# Patient Record
Sex: Male | Born: 1950 | Race: Black or African American | Hispanic: No | Marital: Married | Smoking: Never smoker
Health system: Southern US, Community
[De-identification: ages and names within clinical notes are randomized; demographics above are authoritative.]

## PROBLEM LIST (undated history)

## (undated) DIAGNOSIS — I10 Essential (primary) hypertension: Secondary | ICD-10-CM

## (undated) DIAGNOSIS — K5790 Diverticulosis of intestine, part unspecified, without perforation or abscess without bleeding: Secondary | ICD-10-CM

## (undated) DIAGNOSIS — K56609 Unspecified intestinal obstruction, unspecified as to partial versus complete obstruction: Secondary | ICD-10-CM

## (undated) DIAGNOSIS — I872 Venous insufficiency (chronic) (peripheral): Secondary | ICD-10-CM

## (undated) DIAGNOSIS — Z87442 Personal history of urinary calculi: Secondary | ICD-10-CM

## (undated) DIAGNOSIS — H409 Unspecified glaucoma: Secondary | ICD-10-CM

## (undated) DIAGNOSIS — C61 Malignant neoplasm of prostate: Secondary | ICD-10-CM

## (undated) DIAGNOSIS — Z8672 Personal history of thrombophlebitis: Secondary | ICD-10-CM

## (undated) DIAGNOSIS — R351 Nocturia: Secondary | ICD-10-CM

## (undated) DIAGNOSIS — R030 Elevated blood-pressure reading, without diagnosis of hypertension: Secondary | ICD-10-CM

## (undated) DIAGNOSIS — K219 Gastro-esophageal reflux disease without esophagitis: Secondary | ICD-10-CM

## (undated) DIAGNOSIS — J309 Allergic rhinitis, unspecified: Secondary | ICD-10-CM

## (undated) HISTORY — PX: OTHER SURGICAL HISTORY: SHX169

## (undated) HISTORY — DX: Elevated blood-pressure reading, without diagnosis of hypertension: R03.0

## (undated) HISTORY — DX: Venous insufficiency (chronic) (peripheral): I87.2

## (undated) HISTORY — DX: Unspecified intestinal obstruction, unspecified as to partial versus complete obstruction: K56.609

## (undated) HISTORY — DX: Gastro-esophageal reflux disease without esophagitis: K21.9

## (undated) HISTORY — DX: Unspecified glaucoma: H40.9

## (undated) HISTORY — DX: Diverticulosis of intestine, part unspecified, without perforation or abscess without bleeding: K57.90

## (undated) HISTORY — PX: COLONOSCOPY: SHX174

## (undated) HISTORY — DX: Personal history of urinary calculi: Z87.442

---

## 1968-10-25 HISTORY — PX: OTHER SURGICAL HISTORY: SHX169

## 1968-10-25 HISTORY — PX: KNEE RECONSTRUCTION: SHX5883

## 2008-02-28 ENCOUNTER — Ambulatory Visit: Payer: Self-pay | Admitting: Family Medicine

## 2008-02-28 DIAGNOSIS — K219 Gastro-esophageal reflux disease without esophagitis: Secondary | ICD-10-CM | POA: Insufficient documentation

## 2008-02-28 DIAGNOSIS — Z87442 Personal history of urinary calculi: Secondary | ICD-10-CM | POA: Insufficient documentation

## 2008-06-04 ENCOUNTER — Ambulatory Visit: Payer: Self-pay | Admitting: Family Medicine

## 2008-06-07 ENCOUNTER — Ambulatory Visit: Payer: Self-pay | Admitting: Family Medicine

## 2008-06-10 ENCOUNTER — Encounter: Payer: Self-pay | Admitting: Family Medicine

## 2008-06-14 LAB — CONVERTED CEMR LAB
Albumin: 3.9 g/dL (ref 3.5–5.2)
BUN: 15 mg/dL (ref 6–23)
Basophils Absolute: 0 10*3/uL (ref 0.0–0.1)
Basophils Relative: 0.3 % (ref 0.0–3.0)
Calcium: 9.2 mg/dL (ref 8.4–10.5)
Chloride: 107 meq/L (ref 96–112)
Creatinine, Ser: 1.2 mg/dL (ref 0.4–1.5)
Direct LDL: 163.7 mg/dL
Eosinophils Absolute: 0.1 10*3/uL (ref 0.0–0.7)
GFR calc Af Amer: 80 mL/min
GFR calc non Af Amer: 66 mL/min
HCT: 43.1 % (ref 39.0–52.0)
HDL: 41.1 mg/dL (ref >39.0)
MCHC: 33.6 g/dL (ref 30.0–36.0)
MCV: 85.4 fL (ref 78.0–100.0)
Monocytes Absolute: 0.5 10*3/uL (ref 0.1–1.0)
Neutro Abs: 2 10*3/uL (ref 1.4–7.7)
Neutrophils Relative %: 51.6 % (ref 43.0–77.0)
PSA: 1.12 ng/mL (ref 0.10–4.00)
RBC: 5.05 M/uL (ref 4.22–5.81)
TSH: 1.35 microintl units/mL (ref 0.35–5.50)
Total Bilirubin: 1 mg/dL (ref 0.3–1.2)
Triglycerides: 42 mg/dL (ref 0–149)

## 2008-10-25 HISTORY — PX: OTHER SURGICAL HISTORY: SHX169

## 2008-11-06 ENCOUNTER — Ambulatory Visit: Payer: Self-pay

## 2008-11-06 ENCOUNTER — Ambulatory Visit: Payer: Self-pay | Admitting: Family Medicine

## 2008-11-06 DIAGNOSIS — I749 Embolism and thrombosis of unspecified artery: Secondary | ICD-10-CM | POA: Insufficient documentation

## 2008-11-08 ENCOUNTER — Ambulatory Visit: Payer: Self-pay | Admitting: Family Medicine

## 2008-11-08 LAB — CONVERTED CEMR LAB
INR: 1.1
Prothrombin Time: 13.2 s

## 2008-11-13 LAB — CONVERTED CEMR LAB
Albumin: 3.8 g/dL (ref 3.5–5.2)
Alkaline Phosphatase: 47 units/L (ref 39–117)
BUN: 18 mg/dL (ref 6–23)
Bilirubin, Direct: 0.1 mg/dL (ref 0.0–0.3)
Calcium: 9.4 mg/dL (ref 8.4–10.5)
Eosinophils Absolute: 0.1 10*3/uL (ref 0.0–0.7)
GFR calc Af Amer: 88 mL/min
GFR calc non Af Amer: 73 mL/min
Glucose, Bld: 87 mg/dL (ref 70–99)
HCT: 44.5 % (ref 39.0–52.0)
Hemoglobin: 15.3 g/dL (ref 13.0–17.0)
MCV: 83.5 fL (ref 78.0–100.0)
Monocytes Absolute: 0.5 10*3/uL (ref 0.1–1.0)
Neutro Abs: 2.7 10*3/uL (ref 1.4–7.7)
Platelets: 227 10*3/uL (ref 150–400)
Potassium: 4.1 meq/L (ref 3.5–5.1)
RDW: 13.4 % (ref 11.5–14.6)
Sodium: 141 meq/L (ref 135–145)
TSH: 1.8 microintl units/mL (ref 0.35–5.50)
Total Protein: 7.2 g/dL (ref 6.0–8.3)

## 2008-11-15 ENCOUNTER — Ambulatory Visit: Payer: Self-pay | Admitting: Family Medicine

## 2008-11-15 DIAGNOSIS — I82409 Acute embolism and thrombosis of unspecified deep veins of unspecified lower extremity: Secondary | ICD-10-CM | POA: Insufficient documentation

## 2008-11-15 LAB — CONVERTED CEMR LAB
INR: 1.1
Prothrombin Time: 12.8 s

## 2008-11-25 ENCOUNTER — Telehealth: Payer: Self-pay | Admitting: Family Medicine

## 2008-11-29 ENCOUNTER — Ambulatory Visit: Payer: Self-pay | Admitting: Family Medicine

## 2008-11-29 DIAGNOSIS — R030 Elevated blood-pressure reading, without diagnosis of hypertension: Secondary | ICD-10-CM | POA: Insufficient documentation

## 2008-11-29 LAB — CONVERTED CEMR LAB: INR: 1.3

## 2008-12-13 ENCOUNTER — Ambulatory Visit: Payer: Self-pay | Admitting: Family Medicine

## 2009-01-10 ENCOUNTER — Ambulatory Visit: Payer: Self-pay | Admitting: Family Medicine

## 2009-02-10 ENCOUNTER — Ambulatory Visit: Payer: Self-pay | Admitting: Family Medicine

## 2009-02-10 LAB — CONVERTED CEMR LAB
INR: 1.5
Prothrombin Time: 12.8 s

## 2009-02-14 ENCOUNTER — Ambulatory Visit: Payer: Self-pay | Admitting: Family Medicine

## 2009-02-14 DIAGNOSIS — J309 Allergic rhinitis, unspecified: Secondary | ICD-10-CM | POA: Insufficient documentation

## 2009-03-07 ENCOUNTER — Ambulatory Visit: Payer: Self-pay | Admitting: Family Medicine

## 2009-03-07 LAB — CONVERTED CEMR LAB: INR: 2.7

## 2009-04-04 ENCOUNTER — Ambulatory Visit: Payer: Self-pay | Admitting: Family Medicine

## 2009-04-04 LAB — CONVERTED CEMR LAB
INR: 3
Prothrombin Time: 20.9 s

## 2009-04-25 ENCOUNTER — Ambulatory Visit: Payer: Self-pay | Admitting: Family Medicine

## 2009-05-02 ENCOUNTER — Encounter: Payer: Self-pay | Admitting: Family Medicine

## 2009-05-02 ENCOUNTER — Ambulatory Visit: Payer: Self-pay

## 2009-05-12 ENCOUNTER — Encounter: Payer: Self-pay | Admitting: Family Medicine

## 2009-09-03 ENCOUNTER — Encounter (INDEPENDENT_AMBULATORY_CARE_PROVIDER_SITE_OTHER): Payer: Self-pay | Admitting: *Deleted

## 2009-09-23 ENCOUNTER — Telehealth: Payer: Self-pay | Admitting: Family Medicine

## 2009-09-23 ENCOUNTER — Ambulatory Visit: Payer: Self-pay

## 2009-09-23 ENCOUNTER — Ambulatory Visit: Payer: Self-pay | Admitting: Family Medicine

## 2010-01-05 ENCOUNTER — Ambulatory Visit: Payer: Self-pay | Admitting: Vascular Surgery

## 2010-01-05 ENCOUNTER — Encounter: Payer: Self-pay | Admitting: Family Medicine

## 2010-01-13 ENCOUNTER — Ambulatory Visit: Payer: Self-pay | Admitting: Family Medicine

## 2010-01-13 DIAGNOSIS — J069 Acute upper respiratory infection, unspecified: Secondary | ICD-10-CM | POA: Insufficient documentation

## 2010-01-13 DIAGNOSIS — N390 Urinary tract infection, site not specified: Secondary | ICD-10-CM | POA: Insufficient documentation

## 2010-01-13 DIAGNOSIS — I10 Essential (primary) hypertension: Secondary | ICD-10-CM | POA: Insufficient documentation

## 2010-01-14 ENCOUNTER — Encounter: Payer: Self-pay | Admitting: Family Medicine

## 2010-03-17 ENCOUNTER — Telehealth: Payer: Self-pay | Admitting: Family Medicine

## 2010-03-20 ENCOUNTER — Ambulatory Visit: Payer: Self-pay | Admitting: Family Medicine

## 2010-03-20 DIAGNOSIS — J209 Acute bronchitis, unspecified: Secondary | ICD-10-CM | POA: Insufficient documentation

## 2010-03-31 ENCOUNTER — Ambulatory Visit: Payer: Self-pay | Admitting: Vascular Surgery

## 2010-05-02 ENCOUNTER — Ambulatory Visit: Payer: Self-pay | Admitting: Family Medicine

## 2010-05-02 DIAGNOSIS — M25569 Pain in unspecified knee: Secondary | ICD-10-CM | POA: Insufficient documentation

## 2010-05-12 ENCOUNTER — Ambulatory Visit: Payer: Self-pay | Admitting: Family Medicine

## 2010-05-25 ENCOUNTER — Ambulatory Visit: Payer: Self-pay | Admitting: Vascular Surgery

## 2010-06-02 ENCOUNTER — Ambulatory Visit: Payer: Self-pay | Admitting: Vascular Surgery

## 2010-06-04 ENCOUNTER — Ambulatory Visit: Payer: Self-pay | Admitting: Family Medicine

## 2010-08-10 ENCOUNTER — Ambulatory Visit: Payer: Self-pay | Admitting: Vascular Surgery

## 2010-09-11 ENCOUNTER — Ambulatory Visit: Payer: Self-pay | Admitting: Family Medicine

## 2010-09-11 DIAGNOSIS — S61209A Unspecified open wound of unspecified finger without damage to nail, initial encounter: Secondary | ICD-10-CM | POA: Insufficient documentation

## 2010-09-11 DIAGNOSIS — L02519 Cutaneous abscess of unspecified hand: Secondary | ICD-10-CM | POA: Insufficient documentation

## 2010-09-11 DIAGNOSIS — L03019 Cellulitis of unspecified finger: Secondary | ICD-10-CM

## 2010-11-24 NOTE — Assessment & Plan Note (Signed)
Summary: consult re: knee pain/cjr   Vital Signs:  Patient profile:   60 year old male Weight:      218 pounds BP sitting:   136 / 84  (left arm) Cuff size:   large  Vitals Entered By: Raechel Ache, RN (May 12, 2010 10:47 AM) CC: C/o L knee pain.   History of Present Illness: Here to discuss pain in the left knee which started about one month ago after he was working on a house where he had to crawl in and out of a window several times. Pain is centered just under the kneecap. The knee swelled at first, but this has improved a lot. using ice packs and Motrin. The pain has slowly improved ever since. Now he has only slight discomfort when he goes up or down stairs. he is scheduled for a laser ablation of the left saphenous vein on 05-25-10, and he wants to make sure this knee issue will not affect the procedure.   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 Josephina Gip  Past Surgical History: Reviewed history from 02/28/2008 and no changes required. Rt knee reconstruction,  injured playing football  Review of Systems  The patient denies anorexia, fever, weight loss, weight gain, vision loss, decreased hearing, hoarseness, chest pain, syncope, dyspnea on exertion, peripheral edema, prolonged cough, headaches, hemoptysis, abdominal pain, melena, hematochezia, severe indigestion/heartburn, hematuria, incontinence, genital sores, muscle weakness, suspicious skin lesions, transient blindness, difficulty walking, depression, unusual weight change, abnormal bleeding, enlarged lymph nodes, angioedema, breast masses, and testicular masses.    Physical Exam  General:  Well-developed,well-nourished,in no acute distress; alert,appropriate and cooperative throughout examination Extremities:   the left knee is normal, no swelling or tenderness. Full ROM   Impression & Recommendations:  Problem # 1:  KNEE PAIN, LEFT, ACUTE (ICD-719.46)  His updated medication list for this problem includes:    Aspirin 81 Mg Tbec (Aspirin) ..... One by mouth every day    Mobic 15 Mg Tabs (Meloxicam) .Marland Kitchen... 1/2 -1 by mouth once daily as needed  Complete Medication List: 1)  Multivitamins Tabs (Multiple vitamin) .Marland Kitchen.. 1 by mouth once daily 2)  Aspirin 81 Mg Tbec (Aspirin) .... One by mouth every day 3)  Mobic 15 Mg Tabs (Meloxicam) .... 1/2 -1 by mouth once daily as needed  Patient Instructions: 1)  This was probably an episode of prepatellar bursitis, but in any event it seems to be almost gone at this point. I reassured him that this would not impact his upcoming procedure at all.  2)  Please schedule a follow-up appointment as needed .

## 2010-11-24 NOTE — Assessment & Plan Note (Signed)
Summary: INFECTED FINGER? // RS   Vital Signs:  Patient profile:   60 year old male O2 Sat:      94 % Temp:     98.6 degrees F Pulse rate:   82 / minute BP sitting:   140 / 90  (left arm) Cuff size:   large  Vitals Entered By: Pura Spice, RN (September 11, 2010 1:15 PM) CC: rt index finger cut at work and thinks infected  occurred 2 wks ago states had TD at his work 2010    History of Present Illness: Here for a laceration to the right index finger which he sustained at work 2 weeks ago. He caught the finger on a sharp piece of steel. He has been cleaning it several times a day with hydrogen peroxide and then dressing it with Neosporin and a Bandaid. It is no longer painful, but it doesn't seem to be healing.   Allergies: No Known Drug Allergies  Past History:  Past Medical History: Reviewed history from 05/12/2010 and no changes required. 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 Josephina Gip  Past Surgical History: Reviewed history from 06/04/2010 and no changes required. Rt knee reconstruction,  injured playing football endovascular laser ablation of veins in left leg per Dr. Hart Rochester [phone]  Review of Systems  The patient denies anorexia, fever, weight loss, weight gain, vision loss, decreased hearing, hoarseness, chest pain, syncope, dyspnea on exertion, peripheral edema, prolonged cough, headaches, hemoptysis, abdominal pain, melena, hematochezia, severe indigestion/heartburn, hematuria, incontinence, genital sores, muscle weakness, suspicious skin lesions, transient blindness, difficulty walking, depression, unusual weight change, abnormal bleeding, enlarged lymph nodes, angioedema, breast masses, and testicular masses.    Physical Exam  General:  Well-developed,well-nourished,in no acute distress; alert,appropriate and cooperative throughout  examination Extremities:  there is a partially healed open wound on the dorsal right index finger which has dry callused edges and a large lump of granulation tissue growing off one side. Ful ROM, not tender, no drainage   Impression & Recommendations:  Problem # 1:  LACERATION OF FINGER (ICD-883.0)  Orders: I&D Abscess, Simple / Single (10060)  Complete Medication List: 1)  Multivitamins Tabs (Multiple vitamin) .Marland Kitchen.. 1 by mouth once daily 2)  Aspirin 81 Mg Tbec (Aspirin) .... One by mouth every day 3)  Mobic 15 Mg Tabs (Meloxicam) .... 1/2 -1 by mouth once daily as needed 4)  Doxycycline Hyclate 100 Mg Caps (Doxycycline hyclate) .... Two times a day  Patient Instructions: 1)  The wound was cleansed with Betadine, then LA was achieved using 1% Xylocaine. The granulation tissue was debrided with a scalpel. The bleeding was controlled with a hyfrecator. The wound was dressed with gauze. he will clean the wound daily with soap and water, and dress it with Neosporin and a Bandaid. I advised him to not use hydrogen peroxide any more. Cover with Doxycycline and follow up as needed . Prescriptions: DOXYCYCLINE HYCLATE 100 MG CAPS (DOXYCYCLINE HYCLATE) two times a day  #20 x 0   Entered and Authorized by:   Nelwyn Salisbury MD   Signed by:   Nelwyn Salisbury MD on 09/11/2010   Method used:   Electronically to        [facility] (retail)       [address] [facility]  [place], [place]  [address]       Ph: [phone]       Fax: [phone]   RxID:   [id number]    Orders Added: 1)  I&D Abscess, Simple / Single [10060]   Immunization History:  Tetanus/Td Immunization History:    Tetanus/Td:  historical (10/25/2008)   Immunization History:  Tetanus/Td Immunization History:    Tetanus/Td:  Historical (10/25/2008)

## 2010-11-24 NOTE — Assessment & Plan Note (Signed)
Summary: blood pressure check/cjr   Vital Signs:  Patient profile:   60 year old male Weight:      222 pounds BMI:     34.90 Temp:     98.2 degrees F oral BP sitting:   140 / 102  (left arm) Cuff size:   regular  Vitals Entered By: Raechel Ache, RN (January 13, 2010 8:42 AM) CC: BP elevated- recent cold and took OTC meds.   History of Present Illness: Here to check his BP and his urine. He had been feeling fine with normal BPs until about 2 weeks ago. In fact his BP when donating blood 2 weeks ago was 120/82. Then he developed URI symptoms with chest congestion and a dry cough. No fever. He took a lot of OTC cold remedies like Alka Seltzer Plus for 2 weeks. Now he feels better and the cough has stopped, but his BP has been high at home. Also during this time he has felt some burning on urinations.   Allergies (verified): No Known Drug Allergies  Past History:  Past Medical History: Reviewed history from 09/23/2009 and no changes required. 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    Physical Exam  General:  Well-developed,well-nourished,in no acute distress; alert,appropriate and cooperative throughout examination Head:  Normocephalic and atraumatic without obvious abnormalities. No apparent alopecia or balding. Eyes:  No corneal or conjunctival inflammation noted. EOMI. Perrla. Funduscopic exam benign,  without hemorrhages, exudates or papilledema. Vision grossly normal. Ears:  External ear exam shows no significant lesions or deformities.  Otoscopic examination reveals clear canals, tympanic membranes are intact bilaterally without bulging, retraction, inflammation or discharge. Hearing is grossly normal bilaterally. Nose:  External nasal examination shows no deformity or inflammation. Nasal mucosa are pink and moist without lesions or exudates. Mouth:  Oral mucosa and oropharynx without lesions or exudates.  Teeth in good repair. Neck:  No deformities, masses, or tenderness noted. Lungs:  Normal respiratory effort, chest expands symmetrically. Lungs are clear to auscultation, no crackles or wheezes. Heart:  Normal rate and regular rhythm. S1 and S2 normal without gallop, murmur, click, rub or other extra sounds.   Impression & Recommendations:  Problem # 1:  UTI (ICD-599.0)  His updated medication list for this problem includes:    Cipro 500 Mg Tabs (Ciprofloxacin hcl) .Marland Kitchen..Marland Kitchen Two times a day  Orders: UA Dipstick w/o Micro (manual) (87564) T-Culture, Urine (33295-18841)  Problem # 2:  VIRAL URI (ICD-465.9)  His updated medication list for this problem includes:    Aspirin 81 Mg Tbec (Aspirin) ..... One by mouth every day  Problem # 3:  ESSENTIAL HYPERTENSION (ICD-401.9)  Complete Medication List: 1)  Multivitamins Tabs (Multiple vitamin) .Marland Kitchen.. 1 by mouth once daily 2)  Aspirin 81 Mg Tbec (Aspirin) .... One by mouth every day 3)  Cipro 500 Mg Tabs (Ciprofloxacin hcl) .... Two times a day  Patient Instructions: 1)  I think his BP has been up from the recent URI and UTI, as well as the OTC meds he hasbeen taking. Treat with Cipro, and cutlture the urine.  Drink more water. Stop all OTC cold meds.  Prescriptions: CIPRO 500 MG TABS (CIPROFLOXACIN HCL) two times a day  #14 x 0   Entered and Authorized by:   Nelwyn Salisbury MD   Signed by:   Nelwyn Salisbury MD on 01/13/2010   Method used:    Electronically to        [facility] (retail)       [address]       [place], [place]  [address]       Ph: [phone]       Fax: [phone]   RxID:   [id number]   Appended Document: blood pressure check/cjr    Clinical Lists Changes  Observations: Added new observation of PH URINE: 5.0  (01/13/2010 9:58) Added new observation of SPEC GR URIN: 1.015  (01/13/2010 9:58) Added new observation of APPEARANCE U: Clear  (01/13/2010 9:58) Added new observation of UA COLOR: yellow  (01/13/2010 9:58) Added new observation of WBC DIPSTK U: negative  (01/13/2010 9:58) Added new observation of NITRITE URN: negative  (01/13/2010 9:58) Added new observation of UROBILINOGEN: 0.2  (01/13/2010 9:58) Added new observation of PROTEIN, URN: trace  (01/13/2010 9:58) Added new observation of BLOOD UR DIP: trace-lysed  (01/13/2010 9:58) Added new observation of KETONES URN: negative  (01/13/2010 9:58) Added new observation of BILIRUBIN UR: negative  (01/13/2010 9:58) Added new observation of GLUCOSE, URN: negative  (01/13/2010 9:58)      Laboratory Results   Urine Tests    Routine Urinalysis   Color: yellow Appearance: Clear Glucose: negative   (Normal Range: Negative) Bilirubin: negative   (Normal Range: Negative) Ketone: negative   (Normal Range: Negative) Spec. Gravity: 1.015   (Normal Range: 1.003-1.035) Blood: trace-lysed   (Normal Range: Negative) pH: 5.0   (Normal Range: 5.0-8.0) Protein: trace   (Normal Range: Negative) Urobilinogen: 0.2   (Normal Range: 0-1) Nitrite: negative   (Normal Range: Negative) Leukocyte Esterace: negative   (Normal Range: Negative)

## 2010-11-24 NOTE — Letter (Signed)
Summary: [facility]  [facility]   Imported By: Maryln Gottron 01/26/2010 11:16:25  _____________________________________________________________________  External Attachment:    Type:   Image     Comment:   External Document

## 2010-11-24 NOTE — Assessment & Plan Note (Signed)
Summary: LEFT LEG SORENESS...AS.   Vital Signs:  Patient profile:   60 year old male Height:      67 inches Weight:      219 pounds BMI:     34.42 O2 Sat:      96 % on Room air Pulse rate:   78 / minute BP sitting:   126 / 82  (left arm) Cuff size:   large  Vitals Entered By: Payton Spark CMA (May 02, 2010 11:01 AM)  O2 Flow:  Room air CC: L leg achy x 2 weeks. Has a blood clot near sore area and is scheduled for surgery in Aug.   History of Present Illness: Pt c/o L knee pain x 1 week.  pt thinks he may have bumped it and he notices everytime he changes the type of shoes he wears it acts up.   Pt having surgery on veins in August.  Pt was on coumadin for 6 months for Superficial thrombophlebitis a year ago..  This does feel different.  No calf pain  Current Medications (verified): 1)  Multivitamins   Tabs (Multiple Vitamin) .Marland Kitchen.. 1 By Mouth Once Daily 2)  Aspirin 81 Mg  Tbec (Aspirin) .... One By Mouth Every Day 3)  Hydromet 5-1.5 Mg/73ml Syrp (Hydrocodone-Homatropine) .Marland Kitchen.. 1 Tsp Q 4 Hours As Needed Cough 4)  Mobic 15 Mg Tabs (Meloxicam) .... 1/2 -1 By Mouth Once Daily As Needed  Allergies (verified): No Known Drug Allergies  Past History:  Past medical, surgical, family and social histories (including risk factors) reviewed for relevance to current acute and chronic problems.  Past Medical History: Reviewed history from 09/23/2009 and no changes required. 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 Rt knee reconstruction,  injured playing football  Family History: Reviewed history from 02/28/2008 and no changes required. Family History Breast cancer 1st degree relative <50 Family History Diabetes 1st degree relative Family History Hypertension Family History of Prostate CA 1st degree relative <50  Social History: Reviewed  history from 02/28/2008 and no changes required. Married Never Smoked Alcohol use-yes Drug use-no Regular exercise-yes  Review of Systems      See HPI  Physical Exam  General:  Well-developed,well-nourished,in no acute distress; alert,appropriate and cooperative throughout examination Msk:  L knee pain-- no pain with palpation  no swelling  NO Calf pain + varicose veins---no pain Skin:  color normal, no rashes, no ulcerations, and no edema.   Psych:  Oriented X3 and normally interactive.     Impression & Recommendations:  Problem # 1:  KNEE PAIN, LEFT, ACUTE (ICD-719.46) wear knee sleeve His updated medication list for this problem includes:    Aspirin 81 Mg Tbec (Aspirin) ..... One by mouth every day    Mobic 15 Mg Tabs (Meloxicam) .Marland Kitchen... 1/2 -1 by mouth once daily as needed  Discussed strengthening exercises, use of ice or heat, and medications.   Complete Medication List: 1)  Multivitamins Tabs (Multiple vitamin) .Marland Kitchen.. 1 by mouth once daily 2)  Aspirin 81 Mg Tbec (Aspirin) .... One by mouth every day 3)  Hydromet 5-1.5 Mg/26ml Syrp (Hydrocodone-homatropine) .Marland Kitchen.. 1 tsp q 4 hours as needed cough 4)  Mobic 15 Mg Tabs (Meloxicam) .... 1/2 -1 by mouth once daily as needed  Patient Instructions: 1)  If pain in calf develops or swelling --go to eR 2)  f/u pcp next week if  no improvement Prescriptions: MOBIC 15 MG TABS (MELOXICAM) 1/2 -1 by mouth once daily as needed  #30 x 2   Entered and Authorized by:   Loreen Freud DO   Signed by:   Loreen Freud DO on 05/02/2010   Method used:   Electronically to        [facility] (retail)       [address]       [place], [place]  [address]       Ph: [phone]       Fax: [phone]   RxID:   [id number]

## 2010-11-24 NOTE — Assessment & Plan Note (Signed)
Summary: cough/njr   Vital Signs:  Patient profile:   60 year old male Weight:      212 pounds BMI:     33.32 Temp:     98.4 degrees F oral BP sitting:   130 / 98  (left arm) Cuff size:   regular  Vitals Entered By: Raechel Ache, RN (Mar 20, 2010 10:14 AM) CC: C/o cough and congestion.   History of Present Illness: Here for 2 weeks of chest congestion and coughing up green sputum. No fever.   Allergies: No Known Drug Allergies  Past History:  Past Medical History: Reviewed history from 09/23/2009 and no changes required. 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    Physical Exam  General:  Well-developed,well-nourished,in no acute distress; alert,appropriate and cooperative throughout examination Head:  Normocephalic and atraumatic without obvious abnormalities. No apparent alopecia or balding. Eyes:  No corneal or conjunctival inflammation noted. EOMI. Perrla. Funduscopic exam benign, without hemorrhages, exudates or papilledema. Vision grossly normal. Ears:  External ear exam shows no significant lesions or deformities.  Otoscopic examination reveals clear canals, tympanic membranes are intact bilaterally without bulging, retraction, inflammation or discharge. Hearing is grossly normal bilaterally. Nose:  External nasal examination shows no deformity or inflammation. Nasal mucosa are pink and moist  without lesions or exudates. Mouth:  Oral mucosa and oropharynx without lesions or exudates.  Teeth in good repair. Neck:  No deformities, masses, or tenderness noted. Lungs:  Normal respiratory effort, chest expands symmetrically. Lungs are clear to auscultation, no crackles or wheezes.   Impression & Recommendations:  Problem # 1:  ACUTE BRONCHITIS (ICD-466.0)  His updated medication list for this problem includes:    Zithromax Z-pak 250 Mg Tabs (Azithromycin) .Marland Kitchen... As directed    Hydromet 5-1.5 Mg/50ml Syrp (Hydrocodone-homatropine) .Marland Kitchen... 1 tsp q 4 hours as needed cough  Complete Medication List: 1)  Multivitamins Tabs (Multiple vitamin) .Marland Kitchen.. 1 by mouth once daily 2)  Aspirin 81 Mg Tbec (Aspirin) .... One by mouth every day 3)  Zithromax Z-pak 250 Mg Tabs (Azithromycin) .... As directed 4)  Hydromet 5-1.5 Mg/2ml Syrp (Hydrocodone-homatropine) .Marland Kitchen.. 1 tsp q 4 hours as needed cough  Patient Instructions: 1)  Please schedule a follow-up appointment as needed .  Prescriptions: HYDROMET 5-1.5 MG/5ML SYRP (HYDROCODONE-HOMATROPINE) 1 tsp q 4 hours as needed cough  #240 x 0   Entered and Authorized by:   Nelwyn Salisbury MD   Signed by:   Nelwyn Salisbury MD on 03/20/2010   Method used:   Print then Give to Patient   RxID:   [id number] ZITHROMAX Z-PAK 250 MG TABS (AZITHROMYCIN) as directed  #1 x 0   Entered and Authorized by:   Nelwyn Salisbury MD   Signed by:   Nelwyn Salisbury MD on 03/20/2010   Method used:   Print then Give to Patient   RxID:   [id number]

## 2010-11-24 NOTE — Progress Notes (Signed)
Summary: wants cough relief  Phone Note Call from Patient Call back at Home Phone [phone] Call back at [phone]   Caller: Patient---live call Summary of Call: Complains of productive coughing. no other sxs. Would like something called to [facility] in [place].  Initial call taken by: Warnell Forester,  Mar 17, 2010 12:51 PM  Follow-up for Phone Call        needs an OV Follow-up by: Nelwyn Salisbury MD,  Mar 17, 2010 3:11 PM  Additional Follow-up for Phone Call Additional follow up Details #1::        lmoam-cell Additional Follow-up by: Warnell Forester,  Mar 17, 2010 3:34 PM    Additional Follow-up for Phone Call Additional follow up Details #2::    lmoam again on home #. Follow-up by: Warnell Forester,  Mar 18, 2010 1:35 PM  Additional Follow-up for Phone Call Additional follow up Details #3:: Details for Additional Follow-up Action Taken: Pt has an appt on 03-20-2010. Additional Follow-up by: Warnell Forester,  Mar 19, 2010 4:56 PM

## 2010-11-24 NOTE — Assessment & Plan Note (Signed)
Summary: concerned about BP elevation/dm   Vital Signs:  Patient profile:   60 year old male Weight:      219 pounds BP sitting:   130 / 82  (left arm) Cuff size:   regular  Vitals Entered By: Raechel Ache, RN (June 04, 2010 11:13 AM) CC: Concerned about elevated BP- taking high doses Ibuprofen after surgery.   History of Present Illness: Here to discuss elevations in his BP. He checks this frequently at home, and it usually runs 120s or 130s over 80s. He has had a few elevations to 150/90, and he thinks these were all due to consuming a meal with a lot of sodium the night before. Then yesterday after another endovascular laser treatment to his left leg by Dr. Hart Rochester, his BP went up to 170/100. He had no symptoms with this. He thinks this was from taking high amounts of Ibuprofen after the procedure. Then he took no Ibuprofen last or this am, and his BP is normal again.   Allergies: No Known Drug Allergies  Past History:  Past Medical History: Reviewed history from 05/12/2010 and no changes required. 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 Josephina Gip  Past Surgical History: Rt knee reconstruction,  injured playing football endovascular laser ablation of veins in left leg per Dr. Hart Rochester [phone]  Review of Systems  The patient denies anorexia, fever, weight loss, weight gain, vision loss, decreased hearing, hoarseness, chest pain, syncope, dyspnea on exertion, peripheral edema, prolonged cough, headaches, hemoptysis, abdominal pain, melena, hematochezia, severe indigestion/heartburn, hematuria, incontinence, genital sores, muscle weakness, suspicious skin lesions, transient blindness, difficulty walking, depression, unusual weight change, abnormal bleeding, enlarged lymph nodes, angioedema, breast masses, and testicular masses.    Physical  Exam  General:  Well-developed,well-nourished,in no acute distress; alert,appropriate and cooperative throughout examination Neck:  No deformities, masses, or tenderness noted. Lungs:  Normal respiratory effort, chest expands symmetrically. Lungs are clear to auscultation, no crackles or wheezes. Heart:  Normal rate and regular rhythm. S1 and S2 normal without gallop, murmur, click, rub or other extra sounds. Extremities:  healing incisions on left lower leg   Impression & Recommendations:  Problem # 1:  ELEVATED BLOOD PRESSURE (ICD-796.2)  Complete Medication List: 1)  Multivitamins Tabs (Multiple vitamin) .Marland Kitchen.. 1 by mouth once daily 2)  Aspirin 81 Mg Tbec (Aspirin) .... One by mouth every day 3)  Mobic 15 Mg Tabs (Meloxicam) .... 1/2 -1 by mouth once daily as needed  Patient Instructions: 1)  I agree that these elevations could come from high doses of Ibuprofen or from excessive sodium intake. He will avoid both of these and will follow his BP closely.  2)  Please schedule a follow-up appointment in 3 months .

## 2011-03-09 NOTE — Procedures (Signed)
DUPLEX DEEP VENOUS EXAM - LOWER EXTREMITY   INDICATION:  Postop 1 week ELAS of left greater saphenous vein.   HISTORY:  Edema:  No.  Trauma/Surgery:  05/25/10, ELAS of left greater saphenous vein and  phlebectomies.  Pain:  Left leg is tender.  PE:  No.  Previous DVT:  No.  Anticoagulants:  No.  Other:   DUPLEX EXAM:                CFV   SFV   PopV  PTV    GSV                R  L  R  L  R  L  R   L  R  L  Thrombosis    o  o     o     o      o     +  Spontaneous   +  +     +     +      +     o  Phasic        +  +     +     +      +     o  Augmentation  +  +     +     +      +     o  Compressible  +  +     +     +      +     o  Competent     +  +     +     +      +     o   Legend:  + - yes  o - no  p - partial  D - decreased   IMPRESSION:  Left lower extremity appears to be negative for deep venous  thrombosis.  The left greater saphenous vein was lasered closed and  appears to be thrombosed.    _____________________________  Quita Skye Hart [place], M.D.   NT/MEDQ  D:  06/02/2010  T:  06/03/2010  Job:  [id number]

## 2011-03-09 NOTE — Assessment & Plan Note (Signed)
OFFICE VISIT   HART, HAAS  DOB:  19-Oct-1951                                       05/25/2010  ZOXWR#:[id number]   Patient had laser ablation of his left great saphenous vein from the  knee to the saphenofemoral junction with 10-20 stab phlebectomies for  painful varicosities.   He tolerated the procedure well and will return in 1 week for venous  duplex exam to confirm closure.     Quita Skye Hart [place], M.D.  Electronically Signed   JDL/MEDQ  D:  05/25/2010  T:  05/26/2010  Job:  [id number]

## 2011-03-09 NOTE — Procedures (Signed)
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   EXAM:  Using color-flow imaging and pulse Doppler spectral analysis, the  left common femoral, superficial femoral, popliteal, posterior tibial,  greater and lesser saphenous veins were evaluated.  There is evidence  suggesting deep venous insufficiency in the left lower extremity.   The left saphenofemoral junction is not competent with reflux of  >559milliseconds.  The left GSV is not competent with reflux of  >500  milliseconds with the caliber as described below.   The left proximal short saphenous vein demonstrates competency, however  evidence of chronic thrombus.   GSV Diameter (used if found to be incompetent only)                                            Right    Left  Proximal Greater Saphenous Vein           cm       1.11 cm  Proximal-to-mid-thigh                     cm       cm  Mid thigh                                 cm       0.83 cm  Mid-distal thigh                          cm       cm  Distal thigh                              cm       1.27 cm  Knee                                      cm       0.68 cm   IMPRESSION:  1. Left greater saphenous vein reflux with >564milliseconds is      identified with the caliber ranging from 0.68 cm to 1.11 cm knee to      groin.  2. The left greater saphenous vein is not aneurysmal.  3. The left greater saphenous vein is not tortuous.  4. The deep venous system is not competent with reflux of >500      milliseconds.  5. The left lesser saphenous vein is competent; however, evidence of      partially-occluding chronic thrombus was noted.  6. 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   ___________________________________________  Quita Skye Hart [place], M.D.   AS/MEDQ  D:  01/05/2010  T:  01/05/2010  Job:  [id number]

## 2011-03-09 NOTE — Consult Note (Signed)
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  He has had some occasional stinging and  throbbing discomfort associated with these over the years, and then 1  year ago he developed superficial thrombophlebitis with no evidence of  deep venous thrombosis.  This required anticoagulation with Coumadin for  6 months; hence, his symptoms gradually improved.  Since that time he  has had persistent swelling in the left calf with some thickening of the  skin in the left ankle area.  He has no history of bleeding or stasis  ulceration.  He does not wear elastic compression stockings nor take  pain medicine and occasionally will elevate the left leg.  He has no  symptoms in the contralateral right leg.  He has noticed the progressive  edema of the left calf over the last few years.   CHRONIC MEDICAL PROBLEMS:  1. History of right knee surgery for traumatic injury from football.  2. History of kidney stone.  3. Negative for diabetes, hypertension, coronary artery disease, COPD      or stroke.   FAMILY HISTORY:  Positive for diabetes in a sister, coronary artery  disease in his father at very elderly age of 65.   SOCIAL HISTORY:  He is married, has 5 children.  Does not use tobacco or  alcohol.   REVIEW OF SYSTEMS:  The patient denies any weight loss, anorexia, chest  pain, dyspnea on exertion, PND, orthopnea.  No GI or GU symptoms.  No  claudication.  Does occasionally have cramping in the left leg, as noted  above.  No dizziness, blackouts.  Denies any musculoskeletal symptoms.  All other systems in the review of systems are negative.   PHYSICAL EXAMINATION:  Blood pressure 151/102, heart rate 64,  respirations 18.  General:  He is a healthy, well-developed, well-  nourished male  who is no apparent distress.  HEENT:  EOMs intact.  Conjunctivae normal.  Neck:  Supple, 3+ carotid pulses.  No bruits are  audible.  No adenopathy.  Lungs:  Clear to auscultation.  No wheezing.  Cardiovascular:  Regular rhythm.  No murmurs.  Abdomen:  Soft, nontender  with no masses. Musculoskeletal:  No major deformities.  Well-healed  incision over the right knee.  Neurologic:  Normal.  Skin:  Free of  rashes.  There is some thickening of the skin in the lower third of the  left leg.  He has 3+ femoral, popliteal and dorsalis pedis pulses  bilaterally.  He has large bulging varicosities beginning at the knee  level over the left great saphenous system extending down to the mid  calf.  Circumference of the left calf is 44.5, right calf is 41.  No  stasis ulcers are noted.   Today I ordered a venous duplex exam which I reviewed and interpreted.  He has gross reflux throughout the left great saphenous system from the  saphenofemoral junction to the ankle, with some areas of chronic  thrombus and varicose veins off the great saphenous vein just below the  knee.  There is mild reflux in the left deep system with no DVT.  This patient has severe left great saphenous reflux with valvular  incompetence which has been complicated by superficial thrombophlebitis  and has chronic edema in the left calf, putting him at increased risk  for stasis ulceration as well as pain and/or bleeding from varicosities.  We will treat him initially conservatively with long-leg elastic  compression stockings (20 mm-30 mm gradient) as well as ibuprofen for  pain and elevation of the lower extremity on a regular basis.  He will  return in 3 months, and at that time we should consider laser ablation  of the left great saphenous vein with multiple stab phlebectomies to be  done as a single procedure.     Quita Skye Hart [place], M.D.  Electronically Signed   JDL/MEDQ  D:  01/05/2010  T:  01/06/2010  Job:  [id number]    cc:   Jeannett Senior A. Clent Ridges, MD

## 2011-03-09 NOTE — Assessment & Plan Note (Signed)
OFFICE VISIT   FAYSAL, FENOGLIO  DOB:  1951/05/12                                       06/02/2010  CHART#:[id number]   The patient had laser ablation of his left great saphenous vein with  multiple stab phlebectomies performed on August 1 for painful  varicosities and edema in the left leg with a history of  thrombophlebitis 1 year ago.  He has done well with some mild discomfort  along the course of the great saphenous vein at the saphenofemoral  junction but states that is rapidly disappearing.  He says the swelling  in the left ankle has already improved.  He has been wearing his elastic  compression stocking.  He took his ibuprofen as instructed.   On exam today, blood pressure 149/93, heart rate 62, respirations 24.  The stab phlebectomy sites have healed nicely.  There is no distal edema  noted.  He has mild tenderness along the course of the great saphenous  vein in the left thigh.  Today I ordered a venous duplex exam, which I  reviewed and interpreted.  There is no evidence of DVT, and the great  saphenous vein is occluded from the saphenofemoral junction to the  distal thigh.  In general, I think he is doing well.  He will return to  see [place] on a p.r.n. basis.     Quita Skye Hart [place], M.D.  Electronically Signed   JDL/MEDQ  D:  06/02/2010  T:  06/03/2010  Job:  [id number]

## 2011-03-09 NOTE — Assessment & Plan Note (Signed)
OFFICE VISIT   SHIGEO, BAUGH  DOB:  09-06-1951                                       03/31/2010  CHART#:[id number]   The patient returns today for further followup regarding his severe  venous insufficiency of the left leg.  He has large bulging varicosities  in the left medial calf at the great saphenous system and has a history  of thrombophlebitis and chronic edema in the left leg.  He has been  wearing long leg elastic compression stockings and attempting elevation  as much as his job will allow which requires standing all day.  He also  has tried ibuprofen and has had no improvement in his symptoms and  states that the stockings actually worsen his symptoms.   Previous venous duplex revealed gross reflux at the left saphenofemoral  junction and throughout the left great saphenous vein to the knee  feeding these bulging varicosities.   On exam he has a 3+ dorsalis pedis pulse and he has no evidence of deep  venous thrombosis on venous duplex exam.  He has mild reflux in the deep  system of the left leg.   The patient's symptoms are clearly affecting his daily living and his  ability to work and I would recommend proceeding with laser ablation of  the left great saphenous vein with multiple stab phlebectomies as a  single procedure.  We will proceed with precertification to perform this  in the near future.     Quita Skye Hart [place], M.D.  Electronically Signed   JDL/MEDQ  D:  03/31/2010  T:  04/01/2010  Job:  [id number]

## 2011-04-19 ENCOUNTER — Encounter: Payer: Self-pay | Admitting: Family Medicine

## 2012-10-24 ENCOUNTER — Ambulatory Visit (INDEPENDENT_AMBULATORY_CARE_PROVIDER_SITE_OTHER): Payer: 59 | Admitting: Internal Medicine

## 2012-10-24 VITALS — BP 132/90 | HR 64 | Temp 98.4°F | Wt 223.0 lb

## 2012-10-24 DIAGNOSIS — K529 Noninfective gastroenteritis and colitis, unspecified: Secondary | ICD-10-CM

## 2012-10-24 DIAGNOSIS — K5289 Other specified noninfective gastroenteritis and colitis: Secondary | ICD-10-CM

## 2012-10-24 DIAGNOSIS — R109 Unspecified abdominal pain: Secondary | ICD-10-CM

## 2012-10-24 DIAGNOSIS — R12 Heartburn: Secondary | ICD-10-CM

## 2012-10-24 NOTE — Progress Notes (Signed)
Chief Complaint  Patient presents with  . Nausea    Started on 10/20/12.  . Diarrhea  . Abdominal Pain    HPI: Patient comes in today for SDA for  new problem evaluation. Here with wife who is not ill.  PCP NA  Onset stomach cramps  On Dec 27th when eat solids but ok if eats light fluids and soft    Then. Some nausea but no vomiting .  Some coughing also  .  No body aches.  Some at work have stomach problems . Then developed frequent diarrhea without blood of new fever  In the last days. Able to take liquids doesn't usually have Gi issues except does get HB ocass and takes tums .  Some have been sick at work .  Has been taking some juices .  ROS: See pertinent positives and negatives per HPI. No fever . Rash.  No change in bowel habits  Took a laxative  Over the weekend incase  This could help.      Some help.    Due for cpx  ? About BP not on medication not taking nsaids at this time .   Past Medical History  Diagnosis Date  . GERD (gastroesophageal reflux disease)   . History of nephrolithiasis   . Venous insufficiency     L leg; with saphenous varicositis, sees Dr. Josephina Gip     Family History  Problem Relation Age of Onset  . Breast cancer      family hx - 1st degree relatvie <50  . Diabetes      1st degree relative  . Hypertension    . Prostate cancer      1st degree relative <50     History   Social History  . Marital Status: Married    Spouse Name: N/A    Number of Children: N/A  . Years of Education: N/A   Social History Main Topics  . Smoking status: Never Smoker   . Smokeless tobacco: None  . Alcohol Use: None  . Drug Use: No  . Sexually Active: None   Other Topics Concern  . None   Social History Narrative   Married, gets regular exercise.     Outpatient Encounter Prescriptions as of 10/24/2012  Medication Sig Dispense Refill  . aspirin 81 MG EC tablet Take 81 mg by mouth daily.        . Multiple Vitamin (MULTIVITAMIN) tablet Take 1 tablet by  mouth daily.        . [DISCONTINUED] meloxicam (MOBIC) 15 MG tablet Take 7.5-15 mg by mouth daily as needed.          EXAM:  BP 132/90  Pulse 64  Temp 98.4 F (36.9 C)  Wt 223 lb (101.152 kg)  SpO2 99%  There is no height on file to calculate BMI.  GENERAL: vitals reviewed and listed above, alert, oriented, appears well hydrated and in no acute distress non icteric non toxic and good hydration  HEENT: atraumatic, conjunctiva  clear, no obvious abnormalities on inspection of external nose and ears OP : no lesion edema or exudate   NECK: no obvious masses on inspection palpation   LUNGS: clear to auscultation bilaterally, no wheezes, rales or rhonchi, good air movement  CV: HRRR, no clubbing cyanosis or  peripheral edema nl cap refill  Abdomen:  Sof,t normal to increased  bowel sounds without hepatosplenomegaly, no guarding rebound or masses no CVA tenderness  MS: moves all extremities without  noticeable focal  abnormality  PSYCH: pleasant and cooperative, no obvious depression or anxiety  ASSESSMENT AND PLAN:  Discussed the following assessment and plan:  1. Gastroenteritis    assumed viral but a bit vague  2. Heart burn    disc rx diet and alarm features should fu with pcp. if chronic  3. Abdominal cramping    no evidence of obs pattern today  noted gerd on list of problems but currently not under rx care . Healthy Weight loss will help   -Patient advised to return or notify health care team  immediately if symptoms worsen or persist or new concerns arise.  Patient Instructions  I agree this is probably a viral gastroenteritis that will run its course. Treatment is mostly supportive. Avoid excess juices such as apple juice and orange juice but can use other clear liquids such as Gatorade.  Avoid ibuprofen and asa for now until better as this can cause gastritis.   Expect improvement over the next 2-3 days although your stomach maybe queasy your double for another week  or 2. Contact us if he gets severe pain excessive vomiting or blood in her stool or fevers.  Discussed the heartburn with Dr. Clent Ridges.   Acid reflux could cause this and should be followed up she can use over-the-counter ranitidine or Pepcid and her other medicines that can be used in the future for dietary changes would be helpful.     Diet for Gastroesophageal Reflux Disease, Adult Reflux (acid reflux) is when acid from your stomach flows up into the esophagus. When acid comes in contact with the esophagus, the acid causes irritation and soreness (inflammation) in the esophagus. When reflux happens often or so severely that it causes damage to the esophagus, it is called gastroesophageal reflux disease (GERD). Nutrition therapy can help ease the discomfort of GERD. FOODS OR DRINKS TO AVOID OR LIMIT  Smoking or chewing tobacco. Nicotine is one of the most potent stimulants to acid production in the gastrointestinal tract.  Caffeinated and decaffeinated coffee and black tea.  Regular or low-calorie carbonated beverages or energy drinks (caffeine-free carbonated beverages are allowed).   Strong spices, such as black pepper, white pepper, red pepper, cayenne, curry powder, and chili powder.  Peppermint or spearmint.  Chocolate.  High-fat foods, including meats and fried foods. Extra added fats including oils, butter, salad dressings, and nuts. Limit these to less than 8 tsp per day.  Fruits and vegetables if they are not tolerated, such as citrus fruits or tomatoes.  Alcohol.  Any food that seems to aggravate your condition. If you have questions regarding your diet, call your caregiver or a registered dietitian. OTHER THINGS THAT MAY HELP GERD INCLUDE:   Eating your meals slowly, in a relaxed setting.  Eating 5 to 6 small meals per day instead of 3 large meals.  Eliminating food for a period of time if it causes distress.  Not lying down until 3 hours after eating a  meal.  Keeping the head of your bed raised 6 to 9 inches (15 to 23 cm) by using a foam wedge or blocks under the legs of the bed. Lying flat may make symptoms worse.  Being physically active. Weight loss may be helpful in reducing reflux in overweight or obese adults.  Wear loose fitting clothing EXAMPLE MEAL PLAN This meal plan is approximately 2,000 calories based on https://www.[facility].org/ meal planning guidelines. Breakfast   cup cooked oatmeal.  1 cup strawberries.  1 cup low-fat milk.  1 oz almonds. Snack  1 cup cucumber slices.  6 oz yogurt (made from low-fat or fat-free milk). Lunch  2 slice whole-wheat bread.  2 oz sliced [place].  2 tsp mayonnaise.  1 cup blueberries.  1 cup snap peas. Snack  6 whole-wheat crackers.  1 oz string cheese. Dinner   cup brown rice.  1 cup mixed veggies.  1 tsp olive oil.  3 oz grilled fish. Document Released: 10/11/2005 Document Revised: 01/03/2012 Document Reviewed: 08/27/2011 [facility] Patient Information 2013 [place], [place]. Viral Gastroenteritis Viral gastroenteritis is also known as stomach flu. This condition affects the stomach and intestinal tract. It can cause sudden diarrhea and vomiting. The illness typically lasts 3 to 8 days. Most people develop an immune response that eventually gets rid of the virus. While this natural response develops, the virus can make you quite ill. CAUSES  Many different viruses can cause gastroenteritis, such as rotavirus or noroviruses. You can catch one of these viruses by consuming contaminated food or water. You may also catch a virus by sharing utensils or other personal items with an infected person or by touching a contaminated surface. SYMPTOMS  The most common symptoms are diarrhea and vomiting. These problems can cause a severe loss of body fluids (dehydration) and a body salt (electrolyte) imbalance. Other symptoms may include:  Fever.  Headache.  Fatigue.  Abdominal  pain. DIAGNOSIS  Your caregiver can usually diagnose viral gastroenteritis based on your symptoms and a physical exam. A stool sample may also be taken to test for the presence of viruses or other infections. TREATMENT  This illness typically goes away on its own. Treatments are aimed at rehydration. The most serious cases of viral gastroenteritis involve vomiting so severely that you are not able to keep fluids down. In these cases, fluids must be given through an intravenous line (IV). HOME CARE INSTRUCTIONS   Drink enough fluids to keep your urine clear or pale yellow. Drink small amounts of fluids frequently and increase the amounts as tolerated.  Ask your caregiver for specific rehydration instructions.  Avoid:  Foods high in sugar.  Alcohol.  Carbonated drinks.  Tobacco.  Juice.  Caffeine drinks.  Extremely hot or cold fluids.  Fatty, greasy foods.  Too much intake of anything at one time.  Dairy products until 24 to 48 hours after diarrhea stops.  You may consume probiotics. Probiotics are active cultures of beneficial bacteria. They may lessen the amount and number of diarrheal stools in adults. Probiotics can be found in yogurt with active cultures and in supplements.  Wash your hands well to avoid spreading the virus.  Only take over-the-counter or prescription medicines for pain, discomfort, or fever as directed by your caregiver. Do not give aspirin to children. Antidiarrheal medicines are not recommended.  Ask your caregiver if you should continue to take your regular prescribed and over-the-counter medicines.  Keep all follow-up appointments as directed by your caregiver. SEEK IMMEDIATE MEDICAL CARE IF:   You are unable to keep fluids down.  You do not urinate at least once every 6 to 8 hours.  You develop shortness of breath.  You notice blood in your stool or vomit. This may look like coffee grounds.  You have abdominal pain that increases or is  concentrated in one small area (localized).  You have persistent vomiting or diarrhea.  You have a fever.  The patient is a child younger than 3 months, and he or she has a fever.  The patient is  a child older than 3 months, and he or she has a fever and persistent symptoms.  The patient is a child older than 3 months, and he or she has a fever and symptoms suddenly get worse.  The patient is a baby, and he or she has no tears when crying. MAKE SURE YOU:   Understand these instructions.  Will watch your condition.  Will get help right away if you are not doing well or get worse. Document Released: 10/11/2005 Document Revised: 01/03/2012 Document Reviewed: 07/28/2011 [facility] Patient Information 2013 [place], [place].    Neta Mends. Panosh M.D.  Total visit > 50% spent counseling and coordinating care

## 2012-10-24 NOTE — Patient Instructions (Addendum)
I agree this is probably a viral gastroenteritis that will run its course. Treatment is mostly supportive. Avoid excess juices such as apple juice and orange juice but can use other clear liquids such as Gatorade.  Avoid ibuprofen and asa for now until better as this can cause gastritis.   Expect improvement over the next 2-3 days although your stomach maybe queasy your double for another week or 2. Contact us if he gets severe pain excessive vomiting or blood in her stool or fevers.  Discussed the heartburn with Dr. Clent Ridges.   Acid reflux could cause this and should be followed up she can use over-the-counter ranitidine or Pepcid and her other medicines that can be used in the future for dietary changes would be helpful.     Diet for Gastroesophageal Reflux Disease, Adult Reflux (acid reflux) is when acid from your stomach flows up into the esophagus. When acid comes in contact with the esophagus, the acid causes irritation and soreness (inflammation) in the esophagus. When reflux happens often or so severely that it causes damage to the esophagus, it is called gastroesophageal reflux disease (GERD). Nutrition therapy can help ease the discomfort of GERD. FOODS OR DRINKS TO AVOID OR LIMIT  Smoking or chewing tobacco. Nicotine is one of the most potent stimulants to acid production in the gastrointestinal tract.  Caffeinated and decaffeinated coffee and black tea.  Regular or low-calorie carbonated beverages or energy drinks (caffeine-free carbonated beverages are allowed).   Strong spices, such as black pepper, white pepper, red pepper, cayenne, curry powder, and chili powder.  Peppermint or spearmint.  Chocolate.  High-fat foods, including meats and fried foods. Extra added fats including oils, butter, salad dressings, and nuts. Limit these to less than 8 tsp per day.  Fruits and vegetables if they are not tolerated, such as citrus fruits or tomatoes.  Alcohol.  Any food that seems to  aggravate your condition. If you have questions regarding your diet, call your caregiver or a registered dietitian. OTHER THINGS THAT MAY HELP GERD INCLUDE:   Eating your meals slowly, in a relaxed setting.  Eating 5 to 6 small meals per day instead of 3 large meals.  Eliminating food for a period of time if it causes distress.  Not lying down until 3 hours after eating a meal.  Keeping the head of your bed raised 6 to 9 inches (15 to 23 cm) by using a foam wedge or blocks under the legs of the bed. Lying flat may make symptoms worse.  Being physically active. Weight loss may be helpful in reducing reflux in overweight or obese adults.  Wear loose fitting clothing EXAMPLE MEAL PLAN This meal plan is approximately 2,000 calories based on https://www.bernard.org/ meal planning guidelines. Breakfast   cup cooked oatmeal.  1 cup strawberries.  1 cup low-fat milk.  1 oz almonds. Snack  1 cup cucumber slices.  6 oz yogurt (made from low-fat or fat-free milk). Lunch  2 slice whole-wheat bread.  2 oz sliced [place].  2 tsp mayonnaise.  1 cup blueberries.  1 cup snap peas. Snack  6 whole-wheat crackers.  1 oz string cheese. Dinner   cup brown rice.  1 cup mixed veggies.  1 tsp olive oil.  3 oz grilled fish. Document Released: 10/11/2005 Document Revised: 01/03/2012 Document Reviewed: 08/27/2011 [facility] Patient Information 2013 [place], [place]. Viral Gastroenteritis Viral gastroenteritis is also known as stomach flu. This condition affects the stomach and intestinal tract. It can cause sudden diarrhea and vomiting.  The illness typically lasts 3 to 8 days. Most people develop an immune response that eventually gets rid of the virus. While this natural response develops, the virus can make you quite ill. CAUSES  Many different viruses can cause gastroenteritis, such as rotavirus or noroviruses. You can catch one of these viruses by consuming contaminated food or water.  You may also catch a virus by sharing utensils or other personal items with an infected person or by touching a contaminated surface. SYMPTOMS  The most common symptoms are diarrhea and vomiting. These problems can cause a severe loss of body fluids (dehydration) and a body salt (electrolyte) imbalance. Other symptoms may include:  Fever.  Headache.  Fatigue.  Abdominal pain. DIAGNOSIS  Your caregiver can usually diagnose viral gastroenteritis based on your symptoms and a physical exam. A stool sample may also be taken to test for the presence of viruses or other infections. TREATMENT  This illness typically goes away on its own. Treatments are aimed at rehydration. The most serious cases of viral gastroenteritis involve vomiting so severely that you are not able to keep fluids down. In these cases, fluids must be given through an intravenous line (IV). HOME CARE INSTRUCTIONS   Drink enough fluids to keep your urine clear or pale yellow. Drink small amounts of fluids frequently and increase the amounts as tolerated.  Ask your caregiver for specific rehydration instructions.  Avoid:  Foods high in sugar.  Alcohol.  Carbonated drinks.  Tobacco.  Juice.  Caffeine drinks.  Extremely hot or cold fluids.  Fatty, greasy foods.  Too much intake of anything at one time.  Dairy products until 24 to 48 hours after diarrhea stops.  You may consume probiotics. Probiotics are active cultures of beneficial bacteria. They may lessen the amount and number of diarrheal stools in adults. Probiotics can be found in yogurt with active cultures and in supplements.  Wash your hands well to avoid spreading the virus.  Only take over-the-counter or prescription medicines for pain, discomfort, or fever as directed by your caregiver. Do not give aspirin to children. Antidiarrheal medicines are not recommended.  Ask your caregiver if you should continue to take your regular prescribed and  over-the-counter medicines.  Keep all follow-up appointments as directed by your caregiver. SEEK IMMEDIATE MEDICAL CARE IF:   You are unable to keep fluids down.  You do not urinate at least once every 6 to 8 hours.  You develop shortness of breath.  You notice blood in your stool or vomit. This may look like coffee grounds.  You have abdominal pain that increases or is concentrated in one small area (localized).  You have persistent vomiting or diarrhea.  You have a fever.  The patient is a child younger than 3 months, and he or she has a fever.  The patient is a child older than 3 months, and he or she has a fever and persistent symptoms.  The patient is a child older than 3 months, and he or she has a fever and symptoms suddenly get worse.  The patient is a baby, and he or she has no tears when crying. MAKE SURE YOU:   Understand these instructions.  Will watch your condition.  Will get help right away if you are not doing well or get worse. Document Released: 10/11/2005 Document Revised: 01/03/2012 Document Reviewed: 07/28/2011 [facility] Patient Information 2013 [place], [place].

## 2012-10-25 ENCOUNTER — Encounter: Payer: Self-pay | Admitting: Internal Medicine

## 2012-11-06 ENCOUNTER — Encounter: Payer: Self-pay | Admitting: Family Medicine

## 2012-11-06 ENCOUNTER — Ambulatory Visit (INDEPENDENT_AMBULATORY_CARE_PROVIDER_SITE_OTHER): Payer: 59 | Admitting: Family Medicine

## 2012-11-06 VITALS — BP 130/90 | HR 78 | Temp 98.5°F | Wt 225.0 lb

## 2012-11-06 DIAGNOSIS — K219 Gastro-esophageal reflux disease without esophagitis: Secondary | ICD-10-CM

## 2012-11-06 DIAGNOSIS — Z0000 Encounter for general adult medical examination without abnormal findings: Secondary | ICD-10-CM

## 2012-11-06 MED ORDER — RANITIDINE HCL 75 MG PO TABS: 75.0000 mg | ORAL_TABLET | ORAL | Status: DC | PRN

## 2012-11-06 NOTE — Progress Notes (Signed)
  Subjective:    Patient ID: Glen Freeman, male    DOB: [birth year], 62 y.o.   MRN: [id number]  HPI Here to recheck heartburn and a recent gastroenteritis. He seems to have recovered completely from the virus, no nausea or cramps and his BMs are back to normal. He still has occasional heartburn but this is easily controlled with Zantac. He has adjusted his diet. He had an employee health screening recently but he forgot to bring in the results today. He feels well in general.    Review of Systems  Constitutional: Negative.   Respiratory: Negative.   Cardiovascular: Negative.   Gastrointestinal: Negative.        Objective:   Physical Exam  Constitutional: He appears well-developed and well-nourished.  Neck: No thyromegaly present.  Cardiovascular: Normal rate, regular rhythm, normal heart sounds and intact distal pulses.   Pulmonary/Chest: Effort normal and breath sounds normal.  Abdominal: Soft. Bowel sounds are normal. He exhibits no distension and no mass. There is no tenderness. There is no rebound and no guarding.  Lymphadenopathy:    He has no cervical adenopathy.          Assessment & Plan:  His recent viral illness has resolved. He will manage the GERD with diet and prn Zantac. Set up his first colonoscopy. He will get me the results from his health screening.

## 2013-01-09 ENCOUNTER — Encounter: Payer: Self-pay | Admitting: Gastroenterology

## 2013-01-29 ENCOUNTER — Ambulatory Visit (AMBULATORY_SURGERY_CENTER): Payer: PRIVATE HEALTH INSURANCE | Admitting: *Deleted

## 2013-01-29 ENCOUNTER — Encounter: Payer: Self-pay | Admitting: Family Medicine

## 2013-01-29 ENCOUNTER — Ambulatory Visit (INDEPENDENT_AMBULATORY_CARE_PROVIDER_SITE_OTHER): Payer: 59 | Admitting: Family Medicine

## 2013-01-29 VITALS — Ht 67.0 in | Wt 223.0 lb

## 2013-01-29 VITALS — BP 140/80 | HR 70 | Temp 98.5°F | Wt 222.0 lb

## 2013-01-29 DIAGNOSIS — K219 Gastro-esophageal reflux disease without esophagitis: Secondary | ICD-10-CM

## 2013-01-29 DIAGNOSIS — R030 Elevated blood-pressure reading, without diagnosis of hypertension: Secondary | ICD-10-CM

## 2013-01-29 DIAGNOSIS — Z1211 Encounter for screening for malignant neoplasm of colon: Secondary | ICD-10-CM

## 2013-01-29 MED ORDER — MOVIPREP 100 G PO SOLR: ORAL | Status: DC

## 2013-01-29 NOTE — Progress Notes (Signed)
  Subjective:    Patient ID: Glen Freeman, male    DOB: 02-26-1951, 62 y.o.   MRN: [id number]  HPI Here to recheck his BP. He feels well and has no complaints. His BP at the [facility] when he donates blood every 2 months is stable in the range of 120s over 80s. He is due for his colonoscopy this Friday. He had fasting labs through his work last November but i have not had a chance to review these.    Review of Systems  Constitutional: Negative.   Respiratory: Negative.   Cardiovascular: Negative.        Objective:   Physical Exam  Constitutional: He appears well-developed and well-nourished.  Neck: No thyromegaly present.  Cardiovascular: Normal rate, regular rhythm, normal heart sounds and intact distal pulses.   Pulmonary/Chest: Effort normal and breath sounds normal.  Lymphadenopathy:    He has no cervical adenopathy.          Assessment & Plan:  He seems to be doing well. He will fax me a copy of his lab work.

## 2013-02-01 ENCOUNTER — Encounter: Payer: Self-pay | Admitting: Gastroenterology

## 2013-02-02 ENCOUNTER — Encounter: Payer: Self-pay | Admitting: Gastroenterology

## 2013-02-02 ENCOUNTER — Ambulatory Visit (AMBULATORY_SURGERY_CENTER): Payer: 59 | Admitting: Gastroenterology

## 2013-02-02 VITALS — BP 116/75 | HR 59 | Temp 96.6°F | Resp 17 | Ht 67.0 in | Wt 223.0 lb

## 2013-02-02 DIAGNOSIS — Z1211 Encounter for screening for malignant neoplasm of colon: Secondary | ICD-10-CM

## 2013-02-02 DIAGNOSIS — K5730 Diverticulosis of large intestine without perforation or abscess without bleeding: Secondary | ICD-10-CM

## 2013-02-02 HISTORY — PX: COLONOSCOPY: SHX174

## 2013-02-02 MED ORDER — SODIUM CHLORIDE 0.9 % IV SOLN: 500.0000 mL | INTRAVENOUS | Status: DC

## 2013-02-02 NOTE — Progress Notes (Signed)
Patient did not experience any of the following events: a burn prior to discharge; a fall within the facility; wrong site/side/patient/procedure/implant event; or a hospital transfer or hospital admission upon discharge from the facility. (G8907) Patient did not have preoperative order for IV antibiotic SSI prophylaxis. (G8918)  

## 2013-02-02 NOTE — Patient Instructions (Addendum)
YOU HAD AN ENDOSCOPIC PROCEDURE TODAY AT THE [facility]: Refer to the procedure report that was given to you for any specific questions about what was found during the examination.  If the procedure report does not answer your questions, please call your gastroenterologist to clarify.  If you requested that your care partner not be given the details of your procedure findings, then the procedure report has been included in a sealed envelope for you to review at your convenience later.  YOU SHOULD EXPECT: Some feelings of bloating in the abdomen. Passage of more gas than usual.  Walking can help get rid of the air that was put into your GI tract during the procedure and reduce the bloating. If you had a lower endoscopy (such as a colonoscopy or flexible sigmoidoscopy) you may notice spotting of blood in your stool or on the toilet paper. If you underwent a bowel prep for your procedure, then you may not have a normal bowel movement for a few days.  DIET: Your first meal following the procedure should be a light meal and then it is ok to progress to your normal diet.  A half-sandwich or bowl of soup is an example of a good first meal.  Heavy or fried foods are harder to digest and may make you feel nauseous or bloated.  Likewise meals heavy in dairy and vegetables can cause extra gas to form and this can also increase the bloating.  Drink plenty of fluids but you should avoid alcoholic beverages for 24 hours.  ACTIVITY: Your care partner should take you home directly after the procedure.  You should plan to take it easy, moving slowly for the rest of the day.  You can resume normal activity the day after the procedure however you should NOT DRIVE or use heavy machinery for 24 hours (because of the sedation medicines used during the test).    SYMPTOMS TO REPORT IMMEDIATELY: A gastroenterologist can be reached at any hour.  During normal business hours, 8:30 AM to 5:00 PM Monday through Friday,  call [phone].  After hours and on weekends, please call the GI answering service at [phone] who will take a message and have the physician on call contact you.   Following lower endoscopy (colonoscopy or flexible sigmoidoscopy):  Excessive amounts of blood in the stool  Significant tenderness or worsening of abdominal pains  Swelling of the abdomen that is new, acute  Fever of 100F or higher   FOLLOW UP: If any biopsies were taken you will be contacted by phone or by letter within the next 1-3 weeks.  Call your gastroenterologist if you have not heard about the biopsies in 3 weeks.  Our staff will call the home number listed on your records the next business day following your procedure to check on you and address any questions or concerns that you may have at that time regarding the information given to you following your procedure. This is a courtesy call and so if there is no answer at the home number and we have not heard from you through the emergency physician on call, we will assume that you have returned to your regular daily activities without incident.  SIGNATURES/CONFIDENTIALITY: You and/or your care partner have signed paperwork which will be entered into your electronic medical record.  These signatures attest to the fact that that the information above on your After Visit Summary has been reviewed and is understood.  Full responsibility of the confidentiality of   this discharge information lies with you and/or your care-partner.  Diverticulosis, high fiber diet-handouts given  Repeat colonoscopy in 10 years.   

## 2013-02-02 NOTE — Progress Notes (Signed)
Report to pacu rn, vss, bbs=clear 

## 2013-02-02 NOTE — Op Note (Signed)
[facility] [address] [place], [address]   COLONOSCOPY PROCEDURE REPORT  PATIENT: Glen Freeman, Glen Freeman  MR#: [id number] BIRTHDATE: [birth year] , 61  yrs. old GENDER: Male ENDOSCOPIST: Mardella Layman, MD, [facility] REFERRED BY:  Nelwyn Salisbury, M.D. PROCEDURE DATE:  02/02/2013 PROCEDURE:   Colonoscopy, screening ASA CLASS:   Class II INDICATIONS:Average risk patient for colon cancer. MEDICATIONS: propofol (Diprivan) 200mg  IV  DESCRIPTION OF PROCEDURE:   After the risks and benefits and of the procedure were explained, informed consent was obtained.  A digital rectal exam revealed no abnormalities of the rectum.    The LB [id number]  endoscope was introduced through the anus and advanced to the cecum, which was identified by both the appendix and ileocecal valve .  The quality of the prep was excellent, using MoviPrep .  The instrument was then slowly withdrawn as the colon was fully examined.     COLON FINDINGS: Mild diverticulosis was noted in the sigmoid colon. The colon was otherwise normal.  There was no diverticulosis, inflammation, polyps or cancers unless previously stated. Retroflexed views revealed no abnormalities.     The scope was then withdrawn from the patient and the procedure completed.  COMPLICATIONS: There were no complications. ENDOSCOPIC IMPRESSION: 1.   Mild diverticulosis was noted in the sigmoid colon 2.   The colon was otherwise normal ..no polyps noted....  RECOMMENDATIONS: 1.  High fiber diet 2.  Continue current colorectal screening recommendations for "routine risk" patients with a repeat colonoscopy in 10 years.   REPEAT EXAM:  cc:  _______________________________ eSignedMardella Layman, MD, [facility] 02/02/2013 11:09 AM

## 2013-02-05 ENCOUNTER — Telehealth: Payer: Self-pay | Admitting: *Deleted

## 2013-02-05 ENCOUNTER — Encounter: Payer: 59 | Admitting: Gastroenterology

## 2013-02-05 NOTE — Telephone Encounter (Signed)
  Follow up Call-  Call back number 02/02/2013  Post procedure Call Back phone  # [phone] or [phone]  Permission to leave phone message Yes     Patient questions:  Do you have a fever, pain , or abdominal swelling? no Pain Score  0 *  Have you tolerated food without any problems? yes  Have you been able to return to your normal activities? yes  Do you have any questions about your discharge instructions: Diet   no Medications  no Follow up visit  no  Do you have questions or concerns about your Care? no  Actions: * If pain score is 4 or above: No action needed, pain <4.

## 2013-07-02 ENCOUNTER — Ambulatory Visit (INDEPENDENT_AMBULATORY_CARE_PROVIDER_SITE_OTHER): Payer: 59 | Admitting: Family Medicine

## 2013-07-02 ENCOUNTER — Encounter: Payer: Self-pay | Admitting: Family Medicine

## 2013-07-02 VITALS — BP 146/80 | HR 71 | Temp 98.3°F | Wt 228.0 lb

## 2013-07-02 DIAGNOSIS — J019 Acute sinusitis, unspecified: Secondary | ICD-10-CM

## 2013-07-02 MED ORDER — AMOXICILLIN-POT CLAVULANATE 875-125 MG PO TABS: 1.0000 | ORAL_TABLET | Freq: Two times a day (BID) | ORAL | Status: DC

## 2013-07-02 MED ORDER — HYDROCODONE-HOMATROPINE 5-1.5 MG/5ML PO SYRP: 5.0000 mL | ORAL_SOLUTION | ORAL | Status: DC | PRN

## 2013-07-02 NOTE — Progress Notes (Signed)
  Subjective:    Patient ID: Glen Freeman, male    DOB: 11/11/1950, 62 y.o.   MRN: [id number]  HPI Here for 3 weeks of stuffy head, PND, ST,and a dry cough. No fever. On Mucinex D.    Review of Systems  Constitutional: Negative.   HENT: Positive for congestion, postnasal drip and sinus pressure.   Eyes: Negative.   Respiratory: Positive for cough.        Objective:   Physical Exam  Constitutional: He appears well-developed and well-nourished.  HENT:  Right Ear: External ear normal.  Left Ear: External ear normal.  Nose: Nose normal.  Mouth/Throat: Oropharynx is clear and moist.  Eyes: Conjunctivae are normal.  Pulmonary/Chest: Effort normal and breath sounds normal.  Lymphadenopathy:    He has no cervical adenopathy.          Assessment & Plan:  Drink fluids.

## 2013-07-03 ENCOUNTER — Emergency Department (HOSPITAL_COMMUNITY)
Admission: EM | Admit: 2013-07-03 | Discharge: 2013-07-03 | Disposition: A | Discharge status: Home / Self Care | Payer: 59 | Attending: Emergency Medicine | Admitting: Emergency Medicine

## 2013-07-03 ENCOUNTER — Telehealth: Payer: Self-pay | Admitting: Family Medicine

## 2013-07-03 ENCOUNTER — Emergency Department (HOSPITAL_COMMUNITY): Payer: 59

## 2013-07-03 DIAGNOSIS — T426X1A Poisoning by other antiepileptic and sedative-hypnotic drugs, accidental (unintentional), initial encounter: Secondary | ICD-10-CM | POA: Insufficient documentation

## 2013-07-03 DIAGNOSIS — R05 Cough: Secondary | ICD-10-CM | POA: Insufficient documentation

## 2013-07-03 DIAGNOSIS — Z7982 Long term (current) use of aspirin: Secondary | ICD-10-CM | POA: Insufficient documentation

## 2013-07-03 DIAGNOSIS — R110 Nausea: Secondary | ICD-10-CM | POA: Insufficient documentation

## 2013-07-03 DIAGNOSIS — Z8679 Personal history of other diseases of the circulatory system: Secondary | ICD-10-CM | POA: Insufficient documentation

## 2013-07-03 DIAGNOSIS — T426X5A Adverse effect of other antiepileptic and sedative-hypnotic drugs, initial encounter: Secondary | ICD-10-CM | POA: Insufficient documentation

## 2013-07-03 DIAGNOSIS — T50905A Adverse effect of unspecified drugs, medicaments and biological substances, initial encounter: Secondary | ICD-10-CM

## 2013-07-03 DIAGNOSIS — Z8719 Personal history of other diseases of the digestive system: Secondary | ICD-10-CM | POA: Insufficient documentation

## 2013-07-03 DIAGNOSIS — R42 Dizziness and giddiness: Secondary | ICD-10-CM | POA: Insufficient documentation

## 2013-07-03 DIAGNOSIS — R059 Cough, unspecified: Secondary | ICD-10-CM | POA: Insufficient documentation

## 2013-07-03 DIAGNOSIS — J3489 Other specified disorders of nose and nasal sinuses: Secondary | ICD-10-CM | POA: Insufficient documentation

## 2013-07-03 LAB — CBC WITH DIFFERENTIAL/PLATELET
Basophils Absolute: 0 10*3/uL (ref 0.0–0.1)
Eosinophils Relative: 2 % (ref 0–5)
Lymphocytes Relative: 17 % (ref 12–46)
Lymphs Abs: 1 10*3/uL (ref 0.7–4.0)
MCV: 83.9 fL (ref 78.0–100.0)
Neutro Abs: 4.1 10*3/uL (ref 1.7–7.7)
Neutrophils Relative %: 70 % (ref 43–77)
Platelets: 184 10*3/uL (ref 150–400)
RBC: 4.73 MIL/uL (ref 4.22–5.81)
RDW: 14.5 % (ref 11.5–15.5)
WBC: 5.9 10*3/uL (ref 4.0–10.5)

## 2013-07-03 LAB — POCT I-STAT TROPONIN I: Troponin i, poc: 0 ng/mL (ref 0.00–0.08)

## 2013-07-03 LAB — TROPONIN I: Troponin I: <0.3 ng/mL (ref <0.30)

## 2013-07-03 LAB — BASIC METABOLIC PANEL
CO2: 26 mEq/L (ref 19–32)
Calcium: 9 mg/dL (ref 8.4–10.5)
Glucose, Bld: 124 mg/dL — ABNORMAL HIGH (ref 70–99)
Potassium: 3.8 mEq/L (ref 3.5–5.1)
Sodium: 137 mEq/L (ref 135–145)

## 2013-07-03 MED ORDER — BENZONATATE 100 MG PO CAPS
100.0000 mg | ORAL_CAPSULE | Freq: Once | ORAL | Status: AC
  Administered 2013-07-03: 100 mg via ORAL
  Filled 2013-07-03: qty 1

## 2013-07-03 MED ORDER — BENZONATATE 100 MG PO CAPS: 100.0000 mg | ORAL_CAPSULE | Freq: Once | ORAL | Status: DC

## 2013-07-03 MED ORDER — DOXYCYCLINE HYCLATE 100 MG PO CAPS: 100.0000 mg | ORAL_CAPSULE | Freq: Two times a day (BID) | ORAL | Status: DC

## 2013-07-03 NOTE — ED Notes (Signed)
Pt given crackers and ginger ale per Dr. Loretha Stapler.

## 2013-07-03 NOTE — ED Notes (Signed)
Started meds for cough and cold yesterday ( amox cough syrup w/ hydrocodone) and then he started to get dizzy today and nauseated  No cp  Feels weak foolish in head

## 2013-07-03 NOTE — Telephone Encounter (Signed)
Patient Information:  Caller Name: Javoris  Phone: [phone]  Patient: Glen Freeman, Glen Freeman  Gender: Male  DOB: 1951-10-11  Age: 62 Years  PCP: Gershon Crane [facility])  Office Follow Up:  Does the office need to follow up with this patient?: No  Instructions For The Office: N/A  RN Note:  Patient is at the ED at [facility] now.  States has vertigo and dizziness now.  Onset after starting Augmentin and Hydromet syrup after being seen in office for cough 07/02/13.  Per dizziness protocol, disposition be seen within an hour.  Patient to remain and be seen in ED.  krs/can  Symptoms  Reason For Call & Symptoms: dizziness  Reviewed Health History In EMR: Yes  Reviewed Medications In EMR: Yes  Reviewed Allergies In EMR: Yes  Reviewed Surgeries / Procedures: Yes  Date of Onset of Symptoms: 07/03/2013  Guideline(s) Used:  Dizziness  Disposition Per Guideline:   Go to Office Now  Reason For Disposition Reached:   Spinning or tilting sensation (vertigo) present now  Advice Given:  N/A  Patient Will Follow Care Advice:  YES

## 2013-07-03 NOTE — ED Provider Notes (Signed)
CSN: [id number]     Arrival date & time 07/03/13  1207 History   First MD Initiated Contact with Patient 07/03/13 1231     Chief Complaint  Patient presents with  . Medication Reaction   (Consider location/radiation/quality/duration/timing/severity/associated sxs/prior Treatment) HPI Comments: 62 yo male presenting after an episode of lightheadedness and feeling "woozy." Started after he spit up. Also got nauseated and sweaty. No chest pain or shortness of breath. This occurred shortly after taking a dose of Hydromet for his cough. Denies rash, itchiness, shortness of breath, throat swelling.    Patient is a 62 y.o. male presenting with general illness.  Illness Quality:  "woozy" Severity:  Severe Onset quality:  Sudden Duration:  3 hours Timing:  Constant Progression:  Resolved Chronicity:  New Context:  Started treatment for a cough with Hydromet and Augmentin yesterday Relieved by:  Rest Associated symptoms: congestion, cough and nausea   Associated symptoms: no abdominal pain, no chest pain, no diarrhea, no fever, no shortness of breath and no vomiting     Past Medical History  Diagnosis Date  . GERD (gastroesophageal reflux disease)   . History of nephrolithiasis   . Venous insufficiency     L leg; with saphenous varicositis, sees Dr. Josephina Gip   . Elevated blood pressure    Past Surgical History  Procedure Laterality Date  . Superficial thrombophebitis      MEDICAL HX: L leg 11/06/08. on Coumadin for 6 months   . Rt knee reconstruction  1970    injured playing football  . Endovascular laser ablation  2010    of veins in L eg per Dr. Hart Rochester 8/11   Family History  Problem Relation Age of Onset  . Breast cancer      family hx - 1st degree relatvie <50  . Diabetes      1st degree relative  . Hypertension    . Prostate cancer      1st degree relative <50    History  Substance Use Topics  . Smoking status: Never Smoker   . Smokeless tobacco: Never Used  .  Alcohol Use: Yes     Comment: rare    Review of Systems  Constitutional: Negative for fever.  HENT: Positive for congestion.   Respiratory: Positive for cough. Negative for shortness of breath.   Cardiovascular: Negative for chest pain.  Gastrointestinal: Positive for nausea. Negative for vomiting, abdominal pain and diarrhea.  All other systems reviewed and are negative.    Allergies  Review of patient's allergies indicates no known allergies.  Home Medications   Current Outpatient Rx  Name  Route  Sig  Dispense  Refill  . amoxicillin-clavulanate (AUGMENTIN) 875-125 MG per tablet   Oral   Take 1 tablet by mouth 2 (two) times daily.   20 tablet   0   . aspirin 81 MG EC tablet   Oral   Take 81 mg by mouth daily.           . fish oil-omega-3 fatty acids 1000 MG capsule   Oral   Take 1 g by mouth 3 (three) times a week.         Marland Kitchen HYDROcodone-homatropine (HYDROMET) 5-1.5 MG/5ML syrup   Oral   Take 5 mLs by mouth every 4 (four) hours as needed for cough.   240 mL   0   . Multiple Vitamin (MULTIVITAMIN) tablet   Oral   Take 1 tablet by mouth daily.           Marland Kitchen  ranitidine (ZANTAC 75) 75 MG tablet   Oral   Take 1 tablet (75 mg total) by mouth as needed for heartburn.   30 tablet   0    BP 141/92  Pulse 69  Temp(Src) 98.1 F (36.7 C) (Oral)  Resp 16  SpO2 92% Physical Exam  Nursing note and vitals reviewed. Constitutional: He is oriented to person, place, and time. He appears well-developed and well-nourished. No distress.  HENT:  Head: Normocephalic and atraumatic.  Mouth/Throat: Oropharynx is clear and moist.  Eyes: Conjunctivae are normal. Pupils are equal, round, and reactive to light. No scleral icterus.  Neck: Neck supple.  Cardiovascular: Normal rate, regular rhythm, normal heart sounds and intact distal pulses.   No murmur heard. Pulmonary/Chest: Effort normal and breath sounds normal. No stridor. No respiratory distress. He has no wheezes. He  has no rales.  Abdominal: Soft. He exhibits no distension. There is no tenderness.  Musculoskeletal: Normal range of motion. He exhibits no edema.  Neurological: He is alert and oriented to person, place, and time.  Skin: Skin is warm and dry. No rash noted.  Psychiatric: He has a normal mood and affect. His behavior is normal.    ED Course  Procedures (including critical care time) Labs Review Labs Reviewed  BASIC METABOLIC PANEL - Abnormal; Notable for the following:    Glucose, Bld 124 (*)    GFR calc non Af Amer 87 (*)    All other components within normal limits  CBC WITH DIFFERENTIAL  TROPONIN I  TROPONIN I   Imaging Review Dg Chest 2 View  07/03/2013   *RADIOLOGY REPORT*  Clinical Data: Cough, chest pain, nausea  CHEST - 2 VIEW  Comparison: None.  Findings: Mild patchy bibasilar opacities, favored to reflect atelectasis when correlating with the lateral view.  No pleural effusion or pneumothorax.  The heart is normal in size.  Degenerative changes of the visualized thoracolumbar spine.  IMPRESSION: Mild patchy bibasilar opacities, favored to reflect atelectasis.   Original Report Authenticated By: Charline Bills, M.D.  All radiology studies independently viewed by me.     EKG - NSR, rate 62, normal axis, PR interval 222, nonspecific ST elevations (less than 1mm in I, aVL) possibly J point elevation with some baseline wandering, which appears new compared to 2009. EKG - NSR, rate 73, normal axis, PR 200, nonspecific ST changes improved.  MDM   1. Medication reaction, initial encounter    Apparent presyncopal episode in setting of productive cough for a few weeks, taking Hydromet and Augmentin since yesterday.  Most likely a reaction to the Hydromet, but could also be secondary to systemic illness like pneumonia.  Does not appear septic.  Vitals stable.  Doubt allergic reaction or anaphylaxis.  Plan labs, CXR, orthostatics, and monitoring.    Initial workup reassuring.  Pt is a  vague historian.  Initial EKG had some baseline wander, but possibly some ST changes.  Repeat EKG without baseline wander looked better.  However, because of vague history, plan to check delta troponin. He was placed on Augmentin yesterday for sinusitis, which possibly has contributed to his symptoms.  Will advise DC'ing this med and following up with primary doctor.    2nd troponin undetectable . Switched augmentin to doxycycline for his sinusitis.  Advised dc Hydromet.  He will follow up with PCP.    Candyce Churn, MD 07/04/13 1524

## 2013-10-15 ENCOUNTER — Other Ambulatory Visit (INDEPENDENT_AMBULATORY_CARE_PROVIDER_SITE_OTHER): Payer: 59

## 2013-10-15 DIAGNOSIS — Z0000 Encounter for general adult medical examination without abnormal findings: Secondary | ICD-10-CM

## 2013-10-15 LAB — CBC WITH DIFFERENTIAL/PLATELET
Basophils Absolute: 0 10*3/uL (ref 0.0–0.1)
Basophils Relative: 0.4 % (ref 0.0–3.0)
Eosinophils Absolute: 0.1 10*3/uL (ref 0.0–0.7)
Lymphocytes Relative: 38.9 % (ref 12.0–46.0)
MCHC: 32.6 g/dL (ref 30.0–36.0)
Monocytes Relative: 10.5 % (ref 3.0–12.0)
Neutrophils Relative %: 47.3 % (ref 43.0–77.0)
RBC: 5.51 Mil/uL (ref 4.22–5.81)
RDW: 15.2 % — ABNORMAL HIGH (ref 11.5–14.6)

## 2013-10-15 LAB — BASIC METABOLIC PANEL
CO2: 28 mEq/L (ref 19–32)
Chloride: 103 mEq/L (ref 96–112)
Potassium: 4.5 mEq/L (ref 3.5–5.1)

## 2013-10-15 LAB — TSH: TSH: 2.37 u[IU]/mL (ref 0.35–5.50)

## 2013-10-15 LAB — POCT URINALYSIS DIPSTICK
Blood, UA: NEGATIVE
Protein, UA: NEGATIVE
Spec Grav, UA: 1.02
Urobilinogen, UA: 1
pH, UA: 6.5

## 2013-10-15 LAB — LIPID PANEL
Cholesterol: 184 mg/dL (ref 0–200)
LDL Cholesterol: 124 mg/dL — ABNORMAL HIGH (ref 0–99)
Triglycerides: 66 mg/dL (ref 0.0–149.0)
VLDL: 13.2 mg/dL (ref 0.0–40.0)

## 2013-10-15 LAB — HEPATIC FUNCTION PANEL
ALT: 29 U/L (ref 0–53)
AST: 23 U/L (ref 0–37)
Albumin: 4.1 g/dL (ref 3.5–5.2)
Alkaline Phosphatase: 49 U/L (ref 39–117)
Total Protein: 7 g/dL (ref 6.0–8.3)

## 2013-10-17 ENCOUNTER — Encounter: Payer: Self-pay | Admitting: Family Medicine

## 2013-10-17 ENCOUNTER — Ambulatory Visit (INDEPENDENT_AMBULATORY_CARE_PROVIDER_SITE_OTHER): Payer: 59 | Admitting: Family Medicine

## 2013-10-17 VITALS — BP 140/80 | HR 78 | Temp 98.2°F | Ht 66.75 in | Wt 221.0 lb

## 2013-10-17 DIAGNOSIS — Z0000 Encounter for general adult medical examination without abnormal findings: Secondary | ICD-10-CM

## 2013-10-17 NOTE — Progress Notes (Signed)
Pre visit review using our clinic review tool, if applicable. No additional management support is needed unless otherwise documented below in the visit note. 

## 2013-10-17 NOTE — Progress Notes (Signed)
   Subjective:    Patient ID: Glen Freeman, male    DOB: [birth year], 62 y.o.   MRN: [id number]  HPI 62 yr old male for a cpx. He feels well except for aching pains in the legs which are worst when he is working. He spends hours on his feet walking on a concrete floor in a cold and damp environment. He wonders if his varicose veins are part of the problem. He had endovascular ablations done in 2010. He uses Motrin prn.    Review of Systems  Constitutional: Negative.   HENT: Negative.   Eyes: Negative.   Respiratory: Negative.   Cardiovascular: Negative.   Gastrointestinal: Negative.   Genitourinary: Negative.   Musculoskeletal: Negative.   Skin: Negative.   Neurological: Negative.   Psychiatric/Behavioral: Negative.        Objective:   Physical Exam  Constitutional: He is oriented to person, place, and time. He appears well-developed and well-nourished. No distress.  HENT:  Head: Normocephalic and atraumatic.  Right Ear: External ear normal.  Left Ear: External ear normal.  Nose: Nose normal.  Mouth/Throat: Oropharynx is clear and moist. No oropharyngeal exudate.  Eyes: Conjunctivae and EOM are normal. Pupils are equal, round, and reactive to light. Right eye exhibits no discharge. Left eye exhibits no discharge. No scleral icterus.  Neck: Neck supple. No JVD present. No tracheal deviation present. No thyromegaly present.  Cardiovascular: Normal rate, regular rhythm, normal heart sounds and intact distal pulses.  Exam reveals no gallop and no friction rub.   No murmur heard. Pulmonary/Chest: Effort normal and breath sounds normal. No respiratory distress. He has no wheezes. He has no rales. He exhibits no tenderness.  Abdominal: Soft. Bowel sounds are normal. He exhibits no distension and no mass. There is no tenderness. There is no rebound and no guarding.  Genitourinary: Rectum normal, prostate normal and penis normal. Guaiac negative stool. No penile tenderness.    Musculoskeletal: Normal range of motion. He exhibits no edema and no tenderness.  Lymphadenopathy:    He has no cervical adenopathy.  Neurological: He is alert and oriented to person, place, and time. He has normal reflexes. No cranial nerve deficit. He exhibits normal muscle tone. Coordination normal.  Skin: Skin is warm and dry. No rash noted. He is not diaphoretic. No erythema. No pallor.  Psychiatric: He has a normal mood and affect. His behavior is normal. Judgment and thought content normal.          Assessment & Plan:  Well exam. Advised him to see Dr. Hart Rochester again to evaluate his varicose veins.

## 2013-10-23 ENCOUNTER — Encounter: Payer: 59 | Admitting: Family Medicine

## 2013-11-07 ENCOUNTER — Telehealth: Payer: Self-pay | Admitting: Vascular Surgery

## 2013-11-07 NOTE — Telephone Encounter (Addendum)
Message copied by Gena Fray on Wed Nov 07, 2013 10:07 AM ------      Message from: Rolla Flatten      Created: Tue Nov 06, 2013 11:52 AM      Regarding: RE: VV pt with ?s      Contact: Sandoval..he needs a reflux study of his left leg and a [facility] new vv visit. He needs a morning appt. Thx            ----- Message -----         From: Gena Fray         Sent: 11/06/2013  10:37 AM           To: Rolla Flatten, RN      Subject: VV pt with ?s                                            Latron, Ribas is a former VV patient of [facility]. He is having issues with his "leg veins"  that he would like to speak with a nurse about. He can be reached on his cell [phone].            Thanks,      Hinton Dyer       ------  Horris Latino spoke with pts wife to schedule, dpm

## 2013-12-03 ENCOUNTER — Other Ambulatory Visit: Payer: Self-pay | Admitting: *Deleted

## 2013-12-03 DIAGNOSIS — I83893 Varicose veins of bilateral lower extremities with other complications: Secondary | ICD-10-CM

## 2013-12-18 ENCOUNTER — Encounter: Payer: 59 | Admitting: Vascular Surgery

## 2013-12-18 ENCOUNTER — Encounter (HOSPITAL_COMMUNITY): Payer: 59

## 2014-05-20 ENCOUNTER — Ambulatory Visit (INDEPENDENT_AMBULATORY_CARE_PROVIDER_SITE_OTHER): Payer: BC Managed Care – PPO | Admitting: Family Medicine

## 2014-05-20 ENCOUNTER — Encounter: Payer: Self-pay | Admitting: Family Medicine

## 2014-05-20 VITALS — BP 151/95 | HR 74 | Temp 98.3°F | Ht 66.75 in | Wt 223.0 lb

## 2014-05-20 DIAGNOSIS — J0100 Acute maxillary sinusitis, unspecified: Secondary | ICD-10-CM

## 2014-05-20 DIAGNOSIS — J0101 Acute recurrent maxillary sinusitis: Secondary | ICD-10-CM

## 2014-05-20 MED ORDER — AMOXICILLIN-POT CLAVULANATE 875-125 MG PO TABS: 1.0000 | ORAL_TABLET | Freq: Two times a day (BID) | ORAL | Status: DC

## 2014-05-20 MED ORDER — HYDROCODONE-HOMATROPINE 5-1.5 MG/5ML PO SYRP: 5.0000 mL | ORAL_SOLUTION | ORAL | Status: DC | PRN

## 2014-05-20 NOTE — Progress Notes (Signed)
   Subjective:    Patient ID: Glen Freeman, male    DOB: 12-17-1950, 63 y.o.   MRN: [id number]  HPI Here for one month of stuffy head, PND, and coughing up yellow sputum. No fever. Using Alka Seltzer Plus.    Review of Systems  Constitutional: Negative.   HENT: Positive for congestion, postnasal drip and sinus pressure.   Eyes: Negative.   Respiratory: Positive for cough.        Objective:   Physical Exam  Constitutional: He appears well-developed and well-nourished.  HENT:  Right Ear: External ear normal.  Left Ear: External ear normal.  Nose: Nose normal.  Mouth/Throat: Oropharynx is clear and moist.  Eyes: Conjunctivae are normal.  Neck: Neck supple.  Pulmonary/Chest: Effort normal and breath sounds normal.  Lymphadenopathy:    He has no cervical adenopathy.          Assessment & Plan:  Add Mucinex

## 2014-05-20 NOTE — Progress Notes (Signed)
Pre visit review using our clinic review tool, if applicable. No additional management support is needed unless otherwise documented below in the visit note. 

## 2014-12-13 ENCOUNTER — Telehealth: Payer: Self-pay | Admitting: Family Medicine

## 2014-12-13 NOTE — Telephone Encounter (Addendum)
Pt would like to go to [facility] for his labs. Can you put the order in?  Pt lives in [place] and it takes him 45 min to get here. thanks

## 2014-12-16 NOTE — Telephone Encounter (Signed)
We advise pt to have labs drawn here that morning after seeing the doctor, pt has a early morning appointment I left a voice message with this information.

## 2015-01-09 ENCOUNTER — Encounter: Payer: BLUE CROSS/BLUE SHIELD | Admitting: Family Medicine

## 2015-01-09 ENCOUNTER — Telehealth: Payer: Self-pay | Admitting: Family Medicine

## 2015-01-09 NOTE — Telephone Encounter (Signed)
Pt works second shift and worked overtime last night. Overslept due to being so tired

## 2015-01-09 NOTE — Telephone Encounter (Signed)
Pt was on schedule today to see Dr. Sarajane Jews for a CPE. I called and spoke with pt, he really did not give a reason why he missed appointment. I advised pt to reschedule, no specific time.

## 2015-01-13 ENCOUNTER — Encounter: Payer: Self-pay | Admitting: Family Medicine

## 2015-01-13 ENCOUNTER — Ambulatory Visit (INDEPENDENT_AMBULATORY_CARE_PROVIDER_SITE_OTHER): Payer: BLUE CROSS/BLUE SHIELD | Admitting: Family Medicine

## 2015-01-13 VITALS — BP 144/81 | HR 81 | Temp 98.4°F | Ht 66.75 in | Wt 220.0 lb

## 2015-01-13 DIAGNOSIS — S70311A Abrasion, right thigh, initial encounter: Secondary | ICD-10-CM

## 2015-01-13 NOTE — Progress Notes (Signed)
   Subjective:    Patient ID: Glen Freeman, male    DOB: 03/17/1951, 64 y.o.   MRN: [id number]  HPI Here to check an injury to the right thigh which iccurred on 01-02-15 when he was at home unloading some roofing shingles from his truck. His foot slipped and he fell, such that the right thigh scraped against the side of the truck. He soaked the wound that day with Epsom salts and applied Neosporin. It was fairly tender at first but it does not bother him much now.    Review of Systems  Constitutional: Negative.   Skin: Positive for wound.       Objective:   Physical Exam  Constitutional: He appears well-developed and well-nourished.  Skin:  Healing superficial abrasion on the right lateral thigh, not tender           Assessment & Plan:  The wound is almost healed . Recheck prn

## 2015-01-13 NOTE — Progress Notes (Signed)
Pre visit review using our clinic review tool, if applicable. No additional management support is needed unless otherwise documented below in the visit note. 

## 2015-02-13 ENCOUNTER — Ambulatory Visit (INDEPENDENT_AMBULATORY_CARE_PROVIDER_SITE_OTHER): Payer: BLUE CROSS/BLUE SHIELD | Admitting: Family Medicine

## 2015-02-13 ENCOUNTER — Encounter: Payer: BLUE CROSS/BLUE SHIELD | Admitting: Family Medicine

## 2015-02-13 ENCOUNTER — Encounter: Payer: Self-pay | Admitting: Family Medicine

## 2015-02-13 VITALS — BP 139/88 | HR 68 | Temp 98.3°F | Ht 66.75 in | Wt 222.0 lb

## 2015-02-13 DIAGNOSIS — K219 Gastro-esophageal reflux disease without esophagitis: Secondary | ICD-10-CM

## 2015-02-13 DIAGNOSIS — I10 Essential (primary) hypertension: Secondary | ICD-10-CM

## 2015-02-13 DIAGNOSIS — N401 Enlarged prostate with lower urinary tract symptoms: Secondary | ICD-10-CM | POA: Diagnosis not present

## 2015-02-13 DIAGNOSIS — N138 Other obstructive and reflux uropathy: Secondary | ICD-10-CM

## 2015-02-13 LAB — POCT URINALYSIS DIPSTICK
Bilirubin, UA: NEGATIVE
GLUCOSE UA: NEGATIVE
Ketones, UA: NEGATIVE
NITRITE UA: NEGATIVE
Protein, UA: NEGATIVE
SPEC GRAV UA: 1.02
UROBILINOGEN UA: 1
pH, UA: 7

## 2015-02-13 LAB — BASIC METABOLIC PANEL
BUN: 19 mg/dL (ref 6–23)
CALCIUM: 9.6 mg/dL (ref 8.4–10.5)
CO2: 29 meq/L (ref 19–32)
Chloride: 104 mEq/L (ref 96–112)
Creatinine, Ser: 1.11 mg/dL (ref 0.40–1.50)
GFR: 85.4 mL/min (ref >60.00)
GLUCOSE: 101 mg/dL — AB (ref 70–99)
Potassium: 4.2 mEq/L (ref 3.5–5.1)
SODIUM: 137 meq/L (ref 135–145)

## 2015-02-13 LAB — CBC WITH DIFFERENTIAL/PLATELET
BASOS ABS: 0 10*3/uL (ref 0.0–0.1)
BASOS PCT: 0.8 % (ref 0.0–3.0)
EOS ABS: 0.2 10*3/uL (ref 0.0–0.7)
Eosinophils Relative: 3.7 % (ref 0.0–5.0)
HCT: 44.5 % (ref 39.0–52.0)
Hemoglobin: 14.9 g/dL (ref 13.0–17.0)
LYMPHS PCT: 28.7 % (ref 12.0–46.0)
Lymphs Abs: 1.3 10*3/uL (ref 0.7–4.0)
MCHC: 33.4 g/dL (ref 30.0–36.0)
MCV: 83 fl (ref 78.0–100.0)
MONO ABS: 0.5 10*3/uL (ref 0.1–1.0)
Monocytes Relative: 11.8 % (ref 3.0–12.0)
NEUTROS PCT: 55 % (ref 43.0–77.0)
Neutro Abs: 2.5 10*3/uL (ref 1.4–7.7)
PLATELETS: 215 10*3/uL (ref 150.0–400.0)
RBC: 5.36 Mil/uL (ref 4.22–5.81)
RDW: 15.3 % (ref 11.5–15.5)
WBC: 4.5 10*3/uL (ref 4.0–10.5)

## 2015-02-13 LAB — LIPID PANEL
CHOL/HDL RATIO: 3
Cholesterol: 187 mg/dL (ref 0–200)
HDL: 56.8 mg/dL (ref >39.00)
LDL CALC: 121 mg/dL — AB (ref 0–99)
NONHDL: 130.2
Triglycerides: 47 mg/dL (ref 0.0–149.0)
VLDL: 9.4 mg/dL (ref 0.0–40.0)

## 2015-02-13 LAB — TSH: TSH: 1.6 u[IU]/mL (ref 0.35–4.50)

## 2015-02-13 LAB — HEPATIC FUNCTION PANEL
ALK PHOS: 53 U/L (ref 39–117)
ALT: 38 U/L (ref 0–53)
AST: 26 U/L (ref 0–37)
Albumin: 4.2 g/dL (ref 3.5–5.2)
BILIRUBIN TOTAL: 0.8 mg/dL (ref 0.2–1.2)
Bilirubin, Direct: 0.2 mg/dL (ref 0.0–0.3)
TOTAL PROTEIN: 7.1 g/dL (ref 6.0–8.3)

## 2015-02-13 LAB — PSA: PSA: 3.53 ng/mL (ref 0.10–4.00)

## 2015-02-13 NOTE — Progress Notes (Signed)
Pre visit review using our clinic review tool, if applicable. No additional management support is needed unless otherwise documented below in the visit note. 

## 2015-02-13 NOTE — Progress Notes (Signed)
   Subjective:    Patient ID: Glen Freeman, male    DOB: [birth year], 64 y.o.   MRN: [id number]  HPI 64 yr old male to review problems including HTN, GERD, and BPH. He is urinating freely now and he makes a point to drink plenty of water every day. He tries to limit his daily sodium intake. His GERD is controlled with OTC Zantac.    Review of Systems  Constitutional: Negative.   HENT: Negative.   Eyes: Negative.   Respiratory: Negative.   Cardiovascular: Negative.   Gastrointestinal: Negative.   Genitourinary: Negative.   Musculoskeletal: Negative.   Skin: Negative.   Neurological: Negative.   Psychiatric/Behavioral: Negative.        Objective:   Physical Exam  Constitutional: He is oriented to person, place, and time. He appears well-developed and well-nourished. No distress.  HENT:  Head: Normocephalic and atraumatic.  Right Ear: External ear normal.  Left Ear: External ear normal.  Nose: Nose normal.  Mouth/Throat: Oropharynx is clear and moist. No oropharyngeal exudate.  Eyes: Conjunctivae and EOM are normal. Pupils are equal, round, and reactive to light. Right eye exhibits no discharge. Left eye exhibits no discharge. No scleral icterus.  Neck: Neck supple. No JVD present. No tracheal deviation present. No thyromegaly present.  Cardiovascular: Normal rate, regular rhythm, normal heart sounds and intact distal pulses.  Exam reveals no gallop and no friction rub.   No murmur heard. EKG normal   Pulmonary/Chest: Effort normal and breath sounds normal. No respiratory distress. He has no wheezes. He has no rales. He exhibits no tenderness.  Abdominal: Soft. Bowel sounds are normal. He exhibits no distension and no mass. There is no tenderness. There is no rebound and no guarding.  Genitourinary: Rectum normal, prostate normal and penis normal. Guaiac negative stool. No penile tenderness.  Musculoskeletal: Normal range of motion. He exhibits no edema or tenderness.    Lymphadenopathy:    He has no cervical adenopathy.  Neurological: He is alert and oriented to person, place, and time. He has normal reflexes. No cranial nerve deficit. He exhibits normal muscle tone. Coordination normal.  Skin: Skin is warm and dry. No rash noted. He is not diaphoretic. No erythema. No pallor.  Psychiatric: He has a normal mood and affect. His behavior is normal. Judgment and thought content normal.          Assessment & Plan:  He is doing well. We reviewed his diet and exercise plan and I advised him to lose some weight. His GERD is stable. His BPH is controlled. Get fasting labs.

## 2015-02-18 ENCOUNTER — Telehealth: Payer: Self-pay | Admitting: Family Medicine

## 2015-02-18 MED ORDER — AMOXICILLIN-POT CLAVULANATE 875-125 MG PO TABS: 1.0000 | ORAL_TABLET | Freq: Two times a day (BID) | ORAL | Status: DC

## 2015-02-18 NOTE — Telephone Encounter (Signed)
Pt was seen on 4/21 and is waiting for abx for his sinuses call into [facility] [place]. Please call pt once rx has been call into pharm

## 2015-02-18 NOTE — Telephone Encounter (Signed)
I sent in Augmentin to his pharmacy today

## 2015-02-19 ENCOUNTER — Telehealth: Payer: Self-pay | Admitting: Family Medicine

## 2015-02-19 MED ORDER — HYDROCODONE-HOMATROPINE 5-1.5 MG/5ML PO SYRP: 5.0000 mL | ORAL_SOLUTION | ORAL | Status: DC | PRN

## 2015-02-19 NOTE — Telephone Encounter (Signed)
Opened in error

## 2015-02-19 NOTE — Telephone Encounter (Signed)
Called and spoke with pt and pt is aware.  Pt request refill of cough syrup.  Ok per Dr. Sarajane Jews to print.  Pt is aware he will have to pick up the prescription.

## 2015-03-31 ENCOUNTER — Telehealth: Payer: Self-pay | Admitting: Family Medicine

## 2015-03-31 NOTE — Telephone Encounter (Signed)
Spoke with MD Sarajane Jews and he advised home advice was sufficient for patient complaints.

## 2015-03-31 NOTE — Telephone Encounter (Signed)
ate/Time Eilene Ghazi Time): 03/31/2015 9:13:48 AM Confirm and document reason for call. If symptomatic, describe symptoms. ---caller states her husband thinks he has pulled a muscle in his chest - no pain, just muscle soreness- phone number given is to her husband. Pt was pushing an extremely heavy wheel barrow for many hours a few days ago, and front right pectoral muscle hurts. No jaw/arm pain or pressure. "feels like its my muscle" Has the patient traveled out of the country within the last 30 days? ---No Does the patient require triage? ---Yes Related visit to physician within the last 2 weeks? ---No Does the PT have any chronic conditions? (i.e. diabetes, asthma, etc.) ---No Guidelines Guideline Title Affirmed Question Affirmed Notes Chest Injury - Bending Lifting or Twisting Chest or rib pain from bending, lifting, or twisting injury (all triage questions negative) Final Disposition User Ciales, RN, Myna Hidalgo

## 2015-05-06 ENCOUNTER — Encounter: Payer: Self-pay | Admitting: Family Medicine

## 2015-05-06 ENCOUNTER — Ambulatory Visit (INDEPENDENT_AMBULATORY_CARE_PROVIDER_SITE_OTHER): Payer: BLUE CROSS/BLUE SHIELD | Admitting: Family Medicine

## 2015-05-06 VITALS — BP 136/82 | HR 84 | Temp 98.7°F | Ht 66.75 in | Wt 224.0 lb

## 2015-05-06 DIAGNOSIS — J209 Acute bronchitis, unspecified: Secondary | ICD-10-CM

## 2015-05-06 MED ORDER — HYDROCODONE-HOMATROPINE 5-1.5 MG/5ML PO SYRP: 5.0000 mL | ORAL_SOLUTION | ORAL | Status: DC | PRN

## 2015-05-06 MED ORDER — CLARITHROMYCIN 500 MG PO TABS: 500.0000 mg | ORAL_TABLET | Freq: Two times a day (BID) | ORAL | Status: DC

## 2015-05-06 MED ORDER — ALBUTEROL SULFATE HFA 108 (90 BASE) MCG/ACT IN AERS
2.0000 | INHALATION_SPRAY | RESPIRATORY_TRACT | Status: DC | PRN
Start: 2015-05-06 — End: 2019-06-18

## 2015-05-06 NOTE — Progress Notes (Signed)
Pre visit review using our clinic review tool, if applicable. No additional management support is needed unless otherwise documented below in the visit note. 

## 2015-05-06 NOTE — Progress Notes (Signed)
   Subjective:    Patient ID: Glen Freeman, male    DOB: February 21, 1951, 64 y.o.   MRN: [id number]  HPI Here for 2 months of intermittent coughing which is either dry or productive of white sputum. No fever. No chest pain . He does feel mildly SOB at times and he hears himself wheeze. He was treated here in April with a course of Augmentin, but this only partially helped.    Review of Systems  Constitutional: Negative.   HENT: Positive for congestion and postnasal drip. Negative for sinus pressure.   Eyes: Negative.   Respiratory: Positive for cough, chest tightness, shortness of breath and wheezing.   Cardiovascular: Negative.        Objective:   Physical Exam  Constitutional: He appears well-developed and well-nourished. No distress.  HENT:  Right Ear: External ear normal.  Left Ear: External ear normal.  Nose: Nose normal.  Mouth/Throat: Oropharynx is clear and moist.  Eyes: Conjunctivae are normal.  Neck: No thyromegaly present.  Cardiovascular: Normal rate, regular rhythm, normal heart sounds and intact distal pulses.   Pulmonary/Chest: Effort normal and breath sounds normal. He has no wheezes. He has no rales.  Scattered rhonchi  Lymphadenopathy:    He has no cervical adenopathy.          Assessment & Plan:  Bronchitis. Treat with Biaxin to cover atypical bacteria. Use an inhaler prn. Get a  CXR. Written out of work today until 05-14-15.

## 2015-05-07 ENCOUNTER — Ambulatory Visit (INDEPENDENT_AMBULATORY_CARE_PROVIDER_SITE_OTHER)
Admission: RE | Admit: 2015-05-07 | Discharge: 2015-05-07 | Disposition: A | Discharge status: Home / Self Care | Payer: BLUE CROSS/BLUE SHIELD | Source: Ambulatory Visit | Attending: Family Medicine | Admitting: Family Medicine

## 2015-05-07 DIAGNOSIS — R05 Cough: Secondary | ICD-10-CM | POA: Diagnosis not present

## 2015-05-07 DIAGNOSIS — J209 Acute bronchitis, unspecified: Secondary | ICD-10-CM | POA: Diagnosis not present

## 2015-05-13 ENCOUNTER — Ambulatory Visit (INDEPENDENT_AMBULATORY_CARE_PROVIDER_SITE_OTHER): Payer: BLUE CROSS/BLUE SHIELD | Admitting: Family Medicine

## 2015-05-13 ENCOUNTER — Encounter: Payer: Self-pay | Admitting: Family Medicine

## 2015-05-13 VITALS — BP 144/78 | HR 74 | Temp 98.6°F | Ht 66.75 in | Wt 226.0 lb

## 2015-05-13 DIAGNOSIS — J209 Acute bronchitis, unspecified: Secondary | ICD-10-CM

## 2015-05-13 NOTE — Progress Notes (Signed)
Pre visit review using our clinic review tool, if applicable. No additional management support is needed unless otherwise documented below in the visit note. 

## 2015-05-13 NOTE — Progress Notes (Signed)
   Subjective:    Patient ID: Glen Freeman, male    DOB: 04/21/1951, 64 y.o.   MRN: [id number]  HPI Here to follow up a bronchitis. We recently saw him for coughing, wheezing, and SOB. He took a course of Biaxin and this worked well. He now feels fine with none of these sx. He also had a CXR that was clear. He wants to go back to work tomorrow.    Review of Systems  Constitutional: Negative.   HENT: Negative.   Eyes: Negative.   Respiratory: Negative.   Cardiovascular: Negative.        Objective:   Physical Exam  Constitutional: He appears well-developed and well-nourished.  HENT:  Right Ear: External ear normal.  Left Ear: External ear normal.  Nose: Nose normal.  Mouth/Throat: Oropharynx is clear and moist.  Eyes: Conjunctivae are normal.  Neck: No thyromegaly present.  Cardiovascular: Normal rate, regular rhythm, normal heart sounds and intact distal pulses.   Pulmonary/Chest: Effort normal and breath sounds normal. No respiratory distress. He has no wheezes. He has no rales. He exhibits no tenderness.  Lymphadenopathy:    He has no cervical adenopathy.          Assessment & Plan:  His broncitis has resolved. He is cleared to return to work on 05-14-15 with no restrictions.

## 2015-05-16 DIAGNOSIS — Z0279 Encounter for issue of other medical certificate: Secondary | ICD-10-CM

## 2015-09-24 ENCOUNTER — Encounter: Payer: Self-pay | Admitting: Family Medicine

## 2015-09-24 ENCOUNTER — Ambulatory Visit (INDEPENDENT_AMBULATORY_CARE_PROVIDER_SITE_OTHER): Payer: BLUE CROSS/BLUE SHIELD | Admitting: Family Medicine

## 2015-09-24 VITALS — BP 138/86 | Temp 98.6°F | Ht 66.75 in | Wt 225.0 lb

## 2015-09-24 DIAGNOSIS — J019 Acute sinusitis, unspecified: Secondary | ICD-10-CM

## 2015-09-24 MED ORDER — HYDROCODONE-HOMATROPINE 5-1.5 MG/5ML PO SYRP: 5.0000 mL | ORAL_SOLUTION | ORAL | Status: DC | PRN

## 2015-09-24 MED ORDER — CLARITHROMYCIN 500 MG PO TABS: 500.0000 mg | ORAL_TABLET | Freq: Two times a day (BID) | ORAL | Status: DC

## 2015-09-24 NOTE — Progress Notes (Signed)
   Subjective:    Patient ID: Glen Freeman, male    DOB: [birth year], 64 y.o.   MRN: [id number]  HPI Here for several weeks of sinus pressure, PND, and a dry cough. No fever. On Mucinex.  Review of Systems  Constitutional: Negative.   HENT: Positive for congestion, postnasal drip and sinus pressure. Negative for sore throat.   Eyes: Negative.   Respiratory: Positive for cough.        Objective:   Physical Exam  Constitutional: He appears well-developed and well-nourished.  HENT:  Right Ear: External ear normal.  Left Ear: External ear normal.  Nose: Nose normal.  Mouth/Throat: Oropharynx is clear and moist.  Eyes: Conjunctivae are normal.  Neck: No thyromegaly present.  Pulmonary/Chest: Effort normal and breath sounds normal.  Lymphadenopathy:    He has no cervical adenopathy.          Assessment & Plan:  Sinusitis, treat with Biaxin.

## 2015-09-24 NOTE — Progress Notes (Signed)
Pre visit review using our clinic review tool, if applicable. No additional management support is needed unless otherwise documented below in the visit note. 

## 2015-11-11 DIAGNOSIS — N400 Enlarged prostate without lower urinary tract symptoms: Secondary | ICD-10-CM | POA: Diagnosis not present

## 2015-11-11 DIAGNOSIS — M79605 Pain in left leg: Secondary | ICD-10-CM | POA: Diagnosis not present

## 2015-11-11 DIAGNOSIS — Z1389 Encounter for screening for other disorder: Secondary | ICD-10-CM | POA: Diagnosis not present

## 2015-11-11 DIAGNOSIS — I10 Essential (primary) hypertension: Secondary | ICD-10-CM | POA: Diagnosis not present

## 2015-11-11 DIAGNOSIS — Z6834 Body mass index (BMI) 34.0-34.9, adult: Secondary | ICD-10-CM | POA: Diagnosis not present

## 2015-11-11 DIAGNOSIS — K219 Gastro-esophageal reflux disease without esophagitis: Secondary | ICD-10-CM | POA: Diagnosis not present

## 2016-03-08 DIAGNOSIS — Z6835 Body mass index (BMI) 35.0-35.9, adult: Secondary | ICD-10-CM | POA: Diagnosis not present

## 2016-03-08 DIAGNOSIS — Z0000 Encounter for general adult medical examination without abnormal findings: Secondary | ICD-10-CM | POA: Diagnosis not present

## 2016-03-08 DIAGNOSIS — K219 Gastro-esophageal reflux disease without esophagitis: Secondary | ICD-10-CM | POA: Diagnosis not present

## 2016-03-08 DIAGNOSIS — I10 Essential (primary) hypertension: Secondary | ICD-10-CM | POA: Diagnosis not present

## 2016-03-08 DIAGNOSIS — J301 Allergic rhinitis due to pollen: Secondary | ICD-10-CM | POA: Diagnosis not present

## 2016-03-08 DIAGNOSIS — Z125 Encounter for screening for malignant neoplasm of prostate: Secondary | ICD-10-CM | POA: Diagnosis not present

## 2016-03-15 DIAGNOSIS — R05 Cough: Secondary | ICD-10-CM | POA: Diagnosis not present

## 2016-03-15 DIAGNOSIS — Z6835 Body mass index (BMI) 35.0-35.9, adult: Secondary | ICD-10-CM | POA: Diagnosis not present

## 2016-03-15 DIAGNOSIS — M25569 Pain in unspecified knee: Secondary | ICD-10-CM | POA: Diagnosis not present

## 2016-03-15 DIAGNOSIS — R808 Other proteinuria: Secondary | ICD-10-CM | POA: Diagnosis not present

## 2016-03-15 DIAGNOSIS — Z23 Encounter for immunization: Secondary | ICD-10-CM | POA: Diagnosis not present

## 2016-03-15 DIAGNOSIS — Z1389 Encounter for screening for other disorder: Secondary | ICD-10-CM | POA: Diagnosis not present

## 2016-03-15 DIAGNOSIS — R195 Other fecal abnormalities: Secondary | ICD-10-CM | POA: Diagnosis not present

## 2016-03-15 DIAGNOSIS — Z0000 Encounter for general adult medical examination without abnormal findings: Secondary | ICD-10-CM | POA: Diagnosis not present

## 2016-03-15 DIAGNOSIS — N400 Enlarged prostate without lower urinary tract symptoms: Secondary | ICD-10-CM | POA: Diagnosis not present

## 2016-03-15 DIAGNOSIS — I10 Essential (primary) hypertension: Secondary | ICD-10-CM | POA: Diagnosis not present

## 2016-03-15 DIAGNOSIS — K219 Gastro-esophageal reflux disease without esophagitis: Secondary | ICD-10-CM | POA: Diagnosis not present

## 2016-03-15 DIAGNOSIS — I8390 Asymptomatic varicose veins of unspecified lower extremity: Secondary | ICD-10-CM | POA: Diagnosis not present

## 2016-04-13 ENCOUNTER — Ambulatory Visit: Payer: BLUE CROSS/BLUE SHIELD | Admitting: Internal Medicine

## 2016-04-16 ENCOUNTER — Encounter: Payer: Self-pay | Admitting: Gastroenterology

## 2016-04-16 ENCOUNTER — Ambulatory Visit (INDEPENDENT_AMBULATORY_CARE_PROVIDER_SITE_OTHER): Payer: BLUE CROSS/BLUE SHIELD | Admitting: Gastroenterology

## 2016-04-16 VITALS — BP 162/90 | HR 68 | Ht 66.0 in | Wt 219.4 lb

## 2016-04-16 DIAGNOSIS — R195 Other fecal abnormalities: Secondary | ICD-10-CM | POA: Diagnosis not present

## 2016-04-16 MED ORDER — NA SULFATE-K SULFATE-MG SULF 17.5-3.13-1.6 GM/177ML PO SOLN: 1.0000 | Freq: Once | ORAL | Status: DC

## 2016-04-16 NOTE — Progress Notes (Signed)
History of Present Illness: This is a 65 year old referred by Glen Hatchet, MD for the evaluation of occult blood in the stool. Patient has no gastrointestinal complaints and states that routine home stool testing for occult blood was positive. Records from Dr. Hoover Brunette office indicate a positive Hemosure on May 22nd. Patient underwent colonoscopy by Dr. Verl Blalock in April 2014 showing mild sigmoid colon diverticulosis and it was otherwise normal. Denies weight loss, abdominal pain, constipation, diarrhea, change in stool caliber, melena, hematochezia, nausea, vomiting, dysphagia, reflux symptoms, chest pain.   Allergies  Allergen Reactions  . Doxycycline      Caused pt to feel high   Outpatient Prescriptions Prior to Visit  Medication Sig Dispense Refill  . albuterol (PROVENTIL HFA;VENTOLIN HFA) 108 (90 BASE) MCG/ACT inhaler Inhale 2 puffs into the lungs every 4 (four) hours as needed for wheezing or shortness of breath. 1 Inhaler 2  . aspirin 81 MG EC tablet Take 81 mg by mouth 3 (three) times a week.     Marland Kitchen ibuprofen (ADVIL,MOTRIN) 200 MG tablet Take 200 mg by mouth every 6 (six) hours as needed.    . Multiple Vitamin (MULTIVITAMIN) tablet Take 1 tablet by mouth as needed.     . clarithromycin (BIAXIN) 500 MG tablet Take 1 tablet (500 mg total) by mouth 2 (two) times daily. 20 tablet 0  . HYDROcodone-homatropine (HYDROMET) 5-1.5 MG/5ML syrup Take 5 mLs by mouth every 4 (four) hours as needed. 240 mL 0  . ranitidine (ZANTAC 75) 75 MG tablet Take 1 tablet (75 mg total) by mouth as needed for heartburn. 30 tablet 0   No facility-administered medications prior to visit.   Past Medical History  Diagnosis Date  . GERD (gastroesophageal reflux disease)   . History of nephrolithiasis   . Venous insufficiency     L leg; with saphenous varicositis, sees Dr. Tinnie Gens   . Elevated blood pressure   . Diverticulosis    Past Surgical History  Procedure Laterality Date  .  Superficial thrombophebitis      MEDICAL HX: L leg 11/06/08. on Coumadin for 6 months   . Rt knee reconstruction  1970    injured playing football  . Endovascular laser ablation  2010    of veins in L eg per Dr. Kellie Simmering 8/11  . Colonoscopy  02-02-13    per Dr. Sharlett Iles, clear, repeat in 10 yrs    Social History   Social History  . Marital Status: Married    Spouse Name: N/A  . Number of Children: N/A  . Years of Education: N/A   Social History Main Topics  . Smoking status: Never Smoker   . Smokeless tobacco: Never Used  . Alcohol Use: 0.0 oz/week    0 Standard drinks or equivalent per week     Comment: occ  . Drug Use: No  . Sexual Activity: Not Asked   Other Topics Concern  . None   Social History Narrative   Married, gets regular exercise.    Family History  Problem Relation Age of Onset  . Breast cancer      family hx - 1st degree relatvie <50  . Diabetes      1st degree relative  . Hypertension    . Prostate cancer      1st degree relative <50      Review of Systems: Pertinent positive and negative review of systems were noted in the above HPI section. All other review of  systems were otherwise negative.   Physical Exam: General: Well developed, well nourished, no acute distress Head: Normocephalic and atraumatic Eyes:  sclerae anicteric, EOMI Ears: Normal auditory acuity Mouth: No deformity or lesions Neck: Supple, no masses or thyromegaly Lungs: Clear throughout to auscultation Heart: Regular rate and rhythm; no murmurs, rubs or bruits Abdomen: Soft, non tender and non distended. No masses, hepatosplenomegaly or hernias noted. Normal Bowel sounds Rectal: Deferred to colonoscopy Musculoskeletal: Symmetrical with no gross deformities  Skin: No lesions on visible extremities Pulses:  Normal pulses noted Extremities: No clubbing, cyanosis, edema or deformities noted Neurological: Alert oriented x 4, grossly nonfocal Cervical Nodes:  No significant  cervical adenopathy Inguinal Nodes: No significant inguinal adenopathy Psychological:  Alert and cooperative. Normal mood and affect  Assessment and Recommendations:  1. Occult blood in stool. Hemosure positive. Rule out colorectal neoplasms. Schedule colonoscopy. The risks (including bleeding, perforation, infection, missed lesions, medication reactions and possible hospitalization or surgery if complications occur), benefits, and alternatives to colonoscopy with possible biopsy and possible polypectomy were discussed with the patient and they consent to proceed.    cc: Glen Hatchet, MD

## 2016-04-16 NOTE — Patient Instructions (Signed)
You have been scheduled for a colonoscopy. Please follow written instructions given to you at your visit today.  Please pick up your prep supplies at the pharmacy within the next 1-3 days. If you use inhalers (even only as needed), please bring them with you on the day of your procedure. Your physician has requested that you go to www.startemmi.com and enter the access code given to you at your visit today. This web site gives a general overview about your procedure. However, you should still follow specific instructions given to you by our office regarding your preparation for the procedure.  Normal BMI (Body Mass Index- based on height and weight) is between 23 and 30. Your BMI today is Body mass index is 35.43 kg/(m^2). Marland Kitchen Please consider follow up  regarding your BMI with your Primary Care Provider.  Thank you for choosing me and [facility].  Pricilla Riffle. Dagoberto Ligas., MD., Marval Regal

## 2016-04-19 DIAGNOSIS — K219 Gastro-esophageal reflux disease without esophagitis: Secondary | ICD-10-CM | POA: Diagnosis not present

## 2016-04-19 DIAGNOSIS — Z6835 Body mass index (BMI) 35.0-35.9, adult: Secondary | ICD-10-CM | POA: Diagnosis not present

## 2016-04-19 DIAGNOSIS — I10 Essential (primary) hypertension: Secondary | ICD-10-CM | POA: Diagnosis not present

## 2016-04-19 DIAGNOSIS — J301 Allergic rhinitis due to pollen: Secondary | ICD-10-CM | POA: Diagnosis not present

## 2016-05-12 ENCOUNTER — Ambulatory Visit (AMBULATORY_SURGERY_CENTER): Payer: BLUE CROSS/BLUE SHIELD | Admitting: Gastroenterology

## 2016-05-12 ENCOUNTER — Encounter: Payer: Self-pay | Admitting: Gastroenterology

## 2016-05-12 VITALS — BP 130/85 | HR 66 | Temp 98.2°F | Resp 15 | Ht 66.0 in | Wt 219.0 lb

## 2016-05-12 DIAGNOSIS — R195 Other fecal abnormalities: Secondary | ICD-10-CM | POA: Diagnosis not present

## 2016-05-12 DIAGNOSIS — K621 Rectal polyp: Secondary | ICD-10-CM | POA: Diagnosis not present

## 2016-05-12 DIAGNOSIS — D122 Benign neoplasm of ascending colon: Secondary | ICD-10-CM | POA: Diagnosis not present

## 2016-05-12 DIAGNOSIS — D123 Benign neoplasm of transverse colon: Secondary | ICD-10-CM | POA: Diagnosis not present

## 2016-05-12 DIAGNOSIS — D128 Benign neoplasm of rectum: Secondary | ICD-10-CM

## 2016-05-12 MED ORDER — SODIUM CHLORIDE 0.9 % IV SOLN: 500.0000 mL | INTRAVENOUS | Status: DC

## 2016-05-12 NOTE — Patient Instructions (Signed)
YOU HAD AN ENDOSCOPIC PROCEDURE TODAY AT THE [facility]:   Refer to the procedure report that was given to you for any specific questions about what was found during the examination.  If the procedure report does not answer your questions, please call your gastroenterologist to clarify.  If you requested that your care partner not be given the details of your procedure findings, then the procedure report has been included in a sealed envelope for you to review at your convenience later.  YOU SHOULD EXPECT: Some feelings of bloating in the abdomen. Passage of more gas than usual.  Walking can help get rid of the air that was put into your GI tract during the procedure and reduce the bloating. If you had a lower endoscopy (such as a colonoscopy or flexible sigmoidoscopy) you may notice spotting of blood in your stool or on the toilet paper. If you underwent a bowel prep for your procedure, you may not have a normal bowel movement for a few days.  Please Note:  You might notice some irritation and congestion in your nose or some drainage.  This is from the oxygen used during your procedure.  There is no need for concern and it should clear up in a day or so.  SYMPTOMS TO REPORT IMMEDIATELY:   Following lower endoscopy (colonoscopy or flexible sigmoidoscopy):  Excessive amounts of blood in the stool  Significant tenderness or worsening of abdominal pains  Swelling of the abdomen that is new, acute  Fever of 100F or higher    For urgent or emergent issues, a gastroenterologist can be reached at any hour by calling [phone].   DIET: Your first meal following the procedure should be a small meal and then it is ok to progress to your normal diet. Heavy or fried foods are harder to digest and may make you feel nauseous or bloated.  Likewise, meals heavy in dairy and vegetables can increase bloating.  Drink plenty of fluids but you should avoid alcoholic beverages for 24  hours.  ACTIVITY:  You should plan to take it easy for the rest of today and you should NOT DRIVE or use heavy machinery until tomorrow (because of the sedation medicines used during the test).    FOLLOW UP: Our staff will call the number listed on your records the next business day following your procedure to check on you and address any questions or concerns that you may have regarding the information given to you following your procedure. If we do not reach you, we will leave a message.  However, if you are feeling well and you are not experiencing any problems, there is no need to return our call.  We will assume that you have returned to your regular daily activities without incident.  If any biopsies were taken you will be contacted by phone or by letter within the next 1-3 weeks.  Please call us at [phone] if you have not heard about the biopsies in 3 weeks.    SIGNATURES/CONFIDENTIALITY: You and/or your care partner have signed paperwork which will be entered into your electronic medical record.  These signatures attest to the fact that that the information above on your After Visit Summary has been reviewed and is understood.  Full responsibility of the confidentiality of this discharge information lies with you and/or your care-partner.   Resume medications. Information given on polyps,diverticulosis,hemorrhoids and high fiber diet. 

## 2016-05-12 NOTE — Progress Notes (Signed)
Called to room to assist during endoscopic procedure.  Patient ID and intended procedure confirmed with present staff. Received instructions for my participation in the procedure from the performing physician.  

## 2016-05-12 NOTE — Op Note (Signed)
[facility] Patient Name: Glen Freeman Procedure Date: 05/12/2016 2:41 PM MRN: [id number] Endoscopist: Ladene Artist , MD Age: 65 Referring MD:  Date of Birth: 06/26/1951 Gender: Male Account #: [id number] Procedure:                Colonoscopy Indications:              Evaluation of unexplained GI bleeding, Positive                            fecal immunochemical test Medicines:                Monitored Anesthesia Care Procedure:                Pre-Anesthesia Assessment:                           - Prior to the procedure, a History and Physical                            was performed, and patient medications and                            allergies were reviewed. The patient's tolerance of                            previous anesthesia was also reviewed. The risks                            and benefits of the procedure and the sedation                            options and risks were discussed with the patient.                            All questions were answered, and informed consent                            was obtained. Prior Anticoagulants: The patient has                            taken no previous anticoagulant or antiplatelet                            agents. ASA Grade Assessment: II - A patient with                            mild systemic disease. After reviewing the risks                            and benefits, the patient was deemed in                            satisfactory condition to undergo the procedure.  After obtaining informed consent, the colonoscope                            was passed under direct vision. Throughout the                            procedure, the patient's blood pressure, pulse, and                            oxygen saturations were monitored continuously. The                            Model PCF-H190L [id number] [id number]) scope was introduced                            through the anus and advanced to  the the cecum,                            identified by appendiceal orifice and ileocecal                            valve. The ileocecal valve, appendiceal orifice,                            and rectum were photographed. The quality of the                            bowel preparation was excellent. The colonoscopy                            was performed without difficulty. The patient                            tolerated the procedure well. Scope In: 2:46:20 PM Scope Out: 3:01:20 PM Scope Withdrawal Time: 0 hours 12 minutes 54 seconds  Total Procedure Duration: 0 hours 15 minutes 0 seconds  Findings:                 A 7 mm polyp was found in the transverse colon. The                            polyp was sessile. The polyp was removed with a                            cold snare. Resection and retrieval were complete.                           Two sessile polyps were found in the rectum and                            ascending colon. The polyps were 4 to 5 mm in size.  These polyps were removed with a cold biopsy                            forceps. Resection and retrieval were complete.                           Internal hemorrhoids were found during                            retroflexion. The hemorrhoids were small and Grade                            I (internal hemorrhoids that do not prolapse).                           The exam was otherwise without abnormality on                            direct and retroflexion views.                           A few small-mouthed diverticula were found in the                            sigmoid colon. Complications:            No immediate complications. Estimated blood loss:                            None. Estimated Blood Loss:     Estimated blood loss: none. Impression:               - One 7 mm polyp in the transverse colon, removed                            with a cold snare. Resected and retrieved.                            - Two 4 to 5 mm polyps in the rectum and in the                            ascending colon, removed with a cold biopsy                            forceps. Resected and retrieved.                           - Internal hemorrhoids.                           - Diverticulosis in the sigmoid colon. Recommendation:           - Repeat colonoscopy in 5 years for surveillance if                            polyp(s) precancerous, otherwise 10  years for                            screening.                           - Patient has a contact number available for                            emergencies. The signs and symptoms of potential                            delayed complications were discussed with the                            patient. Return to normal activities tomorrow.                            Written discharge instructions were provided to the                            patient.                           - High fiber diet.                           - Continue present medications.                           - Await pathology results. Ladene Artist, MD 05/12/2016 3:06:00 PM This report has been signed electronically.

## 2016-05-12 NOTE — Progress Notes (Signed)
To PACU Pt awake and alert. Report to RN 

## 2016-05-20 ENCOUNTER — Encounter: Payer: Self-pay | Admitting: Gastroenterology

## 2016-06-09 DIAGNOSIS — I10 Essential (primary) hypertension: Secondary | ICD-10-CM | POA: Diagnosis not present

## 2016-06-09 DIAGNOSIS — R05 Cough: Secondary | ICD-10-CM | POA: Diagnosis not present

## 2016-06-09 DIAGNOSIS — Z6836 Body mass index (BMI) 36.0-36.9, adult: Secondary | ICD-10-CM | POA: Diagnosis not present

## 2016-06-09 DIAGNOSIS — J302 Other seasonal allergic rhinitis: Secondary | ICD-10-CM | POA: Diagnosis not present

## 2016-06-09 DIAGNOSIS — H6123 Impacted cerumen, bilateral: Secondary | ICD-10-CM | POA: Diagnosis not present

## 2016-06-09 DIAGNOSIS — H9193 Unspecified hearing loss, bilateral: Secondary | ICD-10-CM | POA: Diagnosis not present

## 2016-06-09 DIAGNOSIS — K219 Gastro-esophageal reflux disease without esophagitis: Secondary | ICD-10-CM | POA: Diagnosis not present

## 2016-06-09 DIAGNOSIS — H9313 Tinnitus, bilateral: Secondary | ICD-10-CM | POA: Diagnosis not present

## 2016-06-22 DIAGNOSIS — H938X2 Other specified disorders of left ear: Secondary | ICD-10-CM | POA: Diagnosis not present

## 2016-07-12 DIAGNOSIS — J302 Other seasonal allergic rhinitis: Secondary | ICD-10-CM | POA: Diagnosis not present

## 2016-07-12 DIAGNOSIS — R05 Cough: Secondary | ICD-10-CM | POA: Diagnosis not present

## 2016-07-12 DIAGNOSIS — K219 Gastro-esophageal reflux disease without esophagitis: Secondary | ICD-10-CM | POA: Diagnosis not present

## 2016-07-12 DIAGNOSIS — Z6835 Body mass index (BMI) 35.0-35.9, adult: Secondary | ICD-10-CM | POA: Diagnosis not present

## 2016-07-12 DIAGNOSIS — I10 Essential (primary) hypertension: Secondary | ICD-10-CM | POA: Diagnosis not present

## 2016-12-24 DIAGNOSIS — K219 Gastro-esophageal reflux disease without esophagitis: Secondary | ICD-10-CM | POA: Diagnosis not present

## 2016-12-24 DIAGNOSIS — Z6836 Body mass index (BMI) 36.0-36.9, adult: Secondary | ICD-10-CM | POA: Diagnosis not present

## 2016-12-24 DIAGNOSIS — J309 Allergic rhinitis, unspecified: Secondary | ICD-10-CM | POA: Diagnosis not present

## 2016-12-24 DIAGNOSIS — I10 Essential (primary) hypertension: Secondary | ICD-10-CM | POA: Diagnosis not present

## 2016-12-24 DIAGNOSIS — R05 Cough: Secondary | ICD-10-CM | POA: Diagnosis not present

## 2017-01-18 DIAGNOSIS — R030 Elevated blood-pressure reading, without diagnosis of hypertension: Secondary | ICD-10-CM | POA: Diagnosis not present

## 2017-01-18 DIAGNOSIS — Z6836 Body mass index (BMI) 36.0-36.9, adult: Secondary | ICD-10-CM | POA: Diagnosis not present

## 2017-01-24 DIAGNOSIS — R030 Elevated blood-pressure reading, without diagnosis of hypertension: Secondary | ICD-10-CM | POA: Diagnosis not present

## 2017-03-10 DIAGNOSIS — I10 Essential (primary) hypertension: Secondary | ICD-10-CM | POA: Diagnosis not present

## 2017-03-10 DIAGNOSIS — Z125 Encounter for screening for malignant neoplasm of prostate: Secondary | ICD-10-CM | POA: Diagnosis not present

## 2017-04-04 DIAGNOSIS — R809 Proteinuria, unspecified: Secondary | ICD-10-CM | POA: Diagnosis not present

## 2017-04-04 DIAGNOSIS — I10 Essential (primary) hypertension: Secondary | ICD-10-CM | POA: Diagnosis not present

## 2017-04-04 DIAGNOSIS — R972 Elevated prostate specific antigen [PSA]: Secondary | ICD-10-CM | POA: Diagnosis not present

## 2017-04-04 DIAGNOSIS — Z23 Encounter for immunization: Secondary | ICD-10-CM | POA: Diagnosis not present

## 2017-04-04 DIAGNOSIS — K219 Gastro-esophageal reflux disease without esophagitis: Secondary | ICD-10-CM | POA: Diagnosis not present

## 2017-04-04 DIAGNOSIS — Z125 Encounter for screening for malignant neoplasm of prostate: Secondary | ICD-10-CM | POA: Diagnosis not present

## 2017-04-04 DIAGNOSIS — Z86010 Personal history of colonic polyps: Secondary | ICD-10-CM | POA: Diagnosis not present

## 2017-04-04 DIAGNOSIS — Z1389 Encounter for screening for other disorder: Secondary | ICD-10-CM | POA: Diagnosis not present

## 2017-04-04 DIAGNOSIS — Z0000 Encounter for general adult medical examination without abnormal findings: Secondary | ICD-10-CM | POA: Diagnosis not present

## 2017-04-04 DIAGNOSIS — Z6835 Body mass index (BMI) 35.0-35.9, adult: Secondary | ICD-10-CM | POA: Diagnosis not present

## 2017-04-04 DIAGNOSIS — N400 Enlarged prostate without lower urinary tract symptoms: Secondary | ICD-10-CM | POA: Diagnosis not present

## 2017-07-24 ENCOUNTER — Encounter (HOSPITAL_COMMUNITY): Payer: Self-pay | Admitting: Emergency Medicine

## 2017-07-24 ENCOUNTER — Ambulatory Visit (HOSPITAL_COMMUNITY)
Admission: EM | Admit: 2017-07-24 | Discharge: 2017-07-24 | Disposition: A | Discharge status: Home / Self Care | Payer: BLUE CROSS/BLUE SHIELD | Attending: Urgent Care | Admitting: Urgent Care

## 2017-07-24 DIAGNOSIS — R05 Cough: Secondary | ICD-10-CM | POA: Diagnosis not present

## 2017-07-24 DIAGNOSIS — R0982 Postnasal drip: Secondary | ICD-10-CM

## 2017-07-24 DIAGNOSIS — R51 Headache: Secondary | ICD-10-CM

## 2017-07-24 DIAGNOSIS — J3089 Other allergic rhinitis: Secondary | ICD-10-CM

## 2017-07-24 DIAGNOSIS — R059 Cough, unspecified: Secondary | ICD-10-CM

## 2017-07-24 DIAGNOSIS — R070 Pain in throat: Secondary | ICD-10-CM | POA: Diagnosis not present

## 2017-07-24 DIAGNOSIS — J302 Other seasonal allergic rhinitis: Secondary | ICD-10-CM | POA: Diagnosis not present

## 2017-07-24 MED ORDER — BACITRACIN ZINC 500 UNIT/GM EX OINT
TOPICAL_OINTMENT | CUTANEOUS | Status: AC
  Filled 2017-07-24: qty 0.9

## 2017-07-24 MED ORDER — KETOROLAC TROMETHAMINE 60 MG/2ML IM SOLN
60.0000 mg | Freq: Once | INTRAMUSCULAR | Status: AC
  Administered 2017-07-24: 60 mg via INTRAMUSCULAR

## 2017-07-24 MED ORDER — KETOROLAC TROMETHAMINE 60 MG/2ML IM SOLN
INTRAMUSCULAR | Status: AC
  Filled 2017-07-24: qty 2

## 2017-07-24 NOTE — ED Triage Notes (Signed)
Cough started today.  Patient says he was ok yesterday.  C/o slight runny nose.  Says regardless of what he eats, he starts coughing really bad.    History of the same

## 2017-07-24 NOTE — ED Provider Notes (Signed)
MRN: [id number] DOB: 1951-07-23  Subjective:   Glen Freeman is a 66 y.o. male presenting for chief complaint of Cough  Reports recurrent episode of productive cough that started today. Has constant tickle in his throat. Admits sinus headache. Denies fever, chest pain, hemoptysis, shob, wheezing, n/v, abdominal pain, sour brash in his throat, sinus congestion, sinus pain, ear pain, ear drainage. Denies smoking cigarettes but admits that he has been around second hand smoke these past 2-3 days. Admits history of allergies but does not use his allergy medications consistently.  No current facility-administered medications for this encounter.   Current Outpatient Prescriptions:  .  AMLODIPINE BESYLATE PO, Take by mouth., Disp: , Rfl:  .  albuterol (PROVENTIL HFA;VENTOLIN HFA) 108 (90 BASE) MCG/ACT inhaler, Inhale 2 puffs into the lungs every 4 (four) hours as needed for wheezing or shortness of breath. (Patient not taking: Reported on 05/12/2016), Disp: 1 Inhaler, Rfl: 2 .  aspirin 81 MG EC tablet, Take 81 mg by mouth 3 (three) times a week. , Disp: , Rfl:  .  fluticasone (FLONASE) 50 MCG/ACT nasal spray, Place 1 spray into both nostrils as needed., Disp: , Rfl: 6 .  ibuprofen (ADVIL,MOTRIN) 200 MG tablet, Take 200 mg by mouth every 6 (six) hours as needed., Disp: , Rfl:  .  loratadine (CLARITIN) 10 MG tablet, Take 1 tablet by mouth daily., Disp: , Rfl: 3 .  Multiple Vitamin (MULTIVITAMIN) tablet, Take 1 tablet by mouth as needed. , Disp: , Rfl:    Glen Freeman is allergic to doxycycline.  Glen Freeman  has a past medical history of Diverticulosis; Elevated blood pressure; GERD (gastroesophageal reflux disease); History of nephrolithiasis; and Venous insufficiency. Also  has a past surgical history that includes superficial thrombophebitis; Rt knee reconstruction (5009); endovascular laser ablation (2010); and Colonoscopy (02-02-13).  Objective:   Vitals: BP (!) 153/88 (BP Location: Left Arm)  Comment: large cuff  Pulse 84   Temp 98.6 F (37 C) (Oral)   Resp (!) 22   SpO2 100%   Physical Exam  Constitutional: He is oriented to person, place, and time. He appears well-developed and well-nourished.  HENT:  TM's flat intact bilaterally, no effusions or erythema. Nasal turbinates boggy and edematous, nasal passages patent. No sinus tenderness. Oropharynx with thick streaks of post-nasal drainage but no tonsillar exudates, mucous membranes moist.  Eyes: Right eye exhibits no discharge. Left eye exhibits no discharge. No scleral icterus.  Neck: Normal range of motion. Neck supple.  Cardiovascular: Normal rate, regular rhythm and intact distal pulses.  Exam reveals no gallop and no friction rub.   No murmur heard. Pulmonary/Chest: No respiratory distress. He has no wheezes. He has no rales.  Abdominal: Soft. Bowel sounds are normal. He exhibits no distension and no mass. There is no tenderness. There is no guarding.  Lymphadenopathy:    He has no cervical adenopathy.  Neurological: He is alert and oriented to person, place, and time.  Skin: Skin is warm and dry. No rash noted.  Psychiatric: He has a normal mood and affect.    Assessment and Plan :   Cough  Throat discomfort  Seasonal allergic rhinitis due to other allergic trigger  Post-nasal drainage  Allergic rhinitis due to other allergic trigger, unspecified seasonality  Start aggressive allergy management. Patient also has a history of GERD, recommended he restart Zantac. Return-to-clinic precautions discussed, patient verbalized understanding.   Glen Eagles, PA-C [facility]  07/24/2017  9:38 PM    Glen Eagles,  PA-C 07/25/17 0012

## 2017-07-24 NOTE — Discharge Instructions (Signed)
For allergic rhinitis which I believe is the source of your throat symptoms and cough, I recommend that you take Zyrtec (cetirizine) 10mg  once daily together with Flonase, 2 sprays into each nostril once daily. Use these medications at least for 1 month as we enter into the fall season. For tonight, take 50mg  of benadryl.

## 2017-11-15 DIAGNOSIS — M7989 Other specified soft tissue disorders: Secondary | ICD-10-CM | POA: Diagnosis not present

## 2017-11-15 DIAGNOSIS — S40012A Contusion of left shoulder, initial encounter: Secondary | ICD-10-CM | POA: Diagnosis not present

## 2017-11-15 DIAGNOSIS — S4992XA Unspecified injury of left shoulder and upper arm, initial encounter: Secondary | ICD-10-CM | POA: Diagnosis not present

## 2017-11-15 DIAGNOSIS — S46912A Strain of unspecified muscle, fascia and tendon at shoulder and upper arm level, left arm, initial encounter: Secondary | ICD-10-CM | POA: Diagnosis not present

## 2017-12-15 ENCOUNTER — Other Ambulatory Visit (HOSPITAL_COMMUNITY): Payer: Self-pay | Admitting: Preventative Medicine

## 2017-12-15 DIAGNOSIS — M75102 Unspecified rotator cuff tear or rupture of left shoulder, not specified as traumatic: Secondary | ICD-10-CM

## 2017-12-23 ENCOUNTER — Ambulatory Visit (HOSPITAL_COMMUNITY): Payer: BLUE CROSS/BLUE SHIELD

## 2018-03-01 DIAGNOSIS — J181 Lobar pneumonia, unspecified organism: Secondary | ICD-10-CM | POA: Diagnosis not present

## 2018-03-01 DIAGNOSIS — Z6835 Body mass index (BMI) 35.0-35.9, adult: Secondary | ICD-10-CM | POA: Diagnosis not present

## 2018-03-01 DIAGNOSIS — R05 Cough: Secondary | ICD-10-CM | POA: Diagnosis not present

## 2018-03-30 DIAGNOSIS — I10 Essential (primary) hypertension: Secondary | ICD-10-CM | POA: Diagnosis not present

## 2018-03-30 DIAGNOSIS — Z0000 Encounter for general adult medical examination without abnormal findings: Secondary | ICD-10-CM | POA: Diagnosis not present

## 2018-03-30 DIAGNOSIS — Z125 Encounter for screening for malignant neoplasm of prostate: Secondary | ICD-10-CM | POA: Diagnosis not present

## 2018-03-30 DIAGNOSIS — R82998 Other abnormal findings in urine: Secondary | ICD-10-CM | POA: Diagnosis not present

## 2018-04-10 DIAGNOSIS — K219 Gastro-esophageal reflux disease without esophagitis: Secondary | ICD-10-CM | POA: Diagnosis not present

## 2018-04-10 DIAGNOSIS — Z0000 Encounter for general adult medical examination without abnormal findings: Secondary | ICD-10-CM | POA: Diagnosis not present

## 2018-04-10 DIAGNOSIS — N400 Enlarged prostate without lower urinary tract symptoms: Secondary | ICD-10-CM | POA: Diagnosis not present

## 2018-04-10 DIAGNOSIS — R972 Elevated prostate specific antigen [PSA]: Secondary | ICD-10-CM | POA: Diagnosis not present

## 2018-04-10 DIAGNOSIS — I10 Essential (primary) hypertension: Secondary | ICD-10-CM | POA: Diagnosis not present

## 2018-04-10 DIAGNOSIS — Z1389 Encounter for screening for other disorder: Secondary | ICD-10-CM | POA: Diagnosis not present

## 2018-05-18 DIAGNOSIS — J309 Allergic rhinitis, unspecified: Secondary | ICD-10-CM | POA: Diagnosis not present

## 2018-05-18 DIAGNOSIS — J0100 Acute maxillary sinusitis, unspecified: Secondary | ICD-10-CM | POA: Diagnosis not present

## 2018-05-18 DIAGNOSIS — R05 Cough: Secondary | ICD-10-CM | POA: Diagnosis not present

## 2018-05-18 DIAGNOSIS — Z6835 Body mass index (BMI) 35.0-35.9, adult: Secondary | ICD-10-CM | POA: Diagnosis not present

## 2018-05-18 DIAGNOSIS — I10 Essential (primary) hypertension: Secondary | ICD-10-CM | POA: Diagnosis not present

## 2018-09-29 DIAGNOSIS — I10 Essential (primary) hypertension: Secondary | ICD-10-CM | POA: Diagnosis not present

## 2018-09-29 DIAGNOSIS — M25562 Pain in left knee: Secondary | ICD-10-CM | POA: Diagnosis not present

## 2018-09-29 DIAGNOSIS — Z6835 Body mass index (BMI) 35.0-35.9, adult: Secondary | ICD-10-CM | POA: Diagnosis not present

## 2018-09-29 DIAGNOSIS — M25561 Pain in right knee: Secondary | ICD-10-CM | POA: Diagnosis not present

## 2019-04-05 DIAGNOSIS — R82998 Other abnormal findings in urine: Secondary | ICD-10-CM | POA: Diagnosis not present

## 2019-04-05 DIAGNOSIS — Z125 Encounter for screening for malignant neoplasm of prostate: Secondary | ICD-10-CM | POA: Diagnosis not present

## 2019-04-05 DIAGNOSIS — Z0000 Encounter for general adult medical examination without abnormal findings: Secondary | ICD-10-CM | POA: Diagnosis not present

## 2019-04-05 DIAGNOSIS — I10 Essential (primary) hypertension: Secondary | ICD-10-CM | POA: Diagnosis not present

## 2019-04-11 DIAGNOSIS — Z0000 Encounter for general adult medical examination without abnormal findings: Secondary | ICD-10-CM | POA: Diagnosis not present

## 2019-04-11 DIAGNOSIS — R030 Elevated blood-pressure reading, without diagnosis of hypertension: Secondary | ICD-10-CM | POA: Diagnosis not present

## 2019-04-11 DIAGNOSIS — R972 Elevated prostate specific antigen [PSA]: Secondary | ICD-10-CM | POA: Diagnosis not present

## 2019-04-11 DIAGNOSIS — Z1331 Encounter for screening for depression: Secondary | ICD-10-CM | POA: Diagnosis not present

## 2019-04-11 DIAGNOSIS — N400 Enlarged prostate without lower urinary tract symptoms: Secondary | ICD-10-CM | POA: Diagnosis not present

## 2019-04-11 DIAGNOSIS — K219 Gastro-esophageal reflux disease without esophagitis: Secondary | ICD-10-CM | POA: Diagnosis not present

## 2019-04-19 DIAGNOSIS — R972 Elevated prostate specific antigen [PSA]: Secondary | ICD-10-CM | POA: Diagnosis not present

## 2019-05-24 DIAGNOSIS — C61 Malignant neoplasm of prostate: Secondary | ICD-10-CM | POA: Diagnosis not present

## 2019-05-24 DIAGNOSIS — R972 Elevated prostate specific antigen [PSA]: Secondary | ICD-10-CM | POA: Diagnosis not present

## 2019-05-31 ENCOUNTER — Other Ambulatory Visit: Payer: Self-pay | Admitting: Urology

## 2019-05-31 DIAGNOSIS — C61 Malignant neoplasm of prostate: Secondary | ICD-10-CM | POA: Diagnosis not present

## 2019-06-01 ENCOUNTER — Encounter: Payer: Self-pay | Admitting: *Deleted

## 2019-06-08 ENCOUNTER — Encounter (HOSPITAL_COMMUNITY)
Admission: RE | Admit: 2019-06-08 | Discharge: 2019-06-08 | Disposition: A | Discharge status: Home / Self Care | Payer: BC Managed Care – PPO | Source: Ambulatory Visit | Attending: Urology | Admitting: Urology

## 2019-06-08 ENCOUNTER — Other Ambulatory Visit: Payer: Self-pay

## 2019-06-08 DIAGNOSIS — C61 Malignant neoplasm of prostate: Secondary | ICD-10-CM | POA: Insufficient documentation

## 2019-06-08 DIAGNOSIS — K5730 Diverticulosis of large intestine without perforation or abscess without bleeding: Secondary | ICD-10-CM | POA: Diagnosis not present

## 2019-06-08 DIAGNOSIS — K8689 Other specified diseases of pancreas: Secondary | ICD-10-CM | POA: Diagnosis not present

## 2019-06-08 DIAGNOSIS — N2889 Other specified disorders of kidney and ureter: Secondary | ICD-10-CM | POA: Diagnosis not present

## 2019-06-08 DIAGNOSIS — R590 Localized enlarged lymph nodes: Secondary | ICD-10-CM | POA: Diagnosis not present

## 2019-06-08 MED ORDER — TECHNETIUM TC 99M MEDRONATE IV KIT
20.0000 | PACK | Freq: Once | INTRAVENOUS | Status: AC | PRN
  Administered 2019-06-08: 10:00:00 20 via INTRAVENOUS

## 2019-06-15 ENCOUNTER — Ambulatory Visit: Payer: Medicare Other | Admitting: Radiation Oncology

## 2019-06-18 ENCOUNTER — Encounter: Payer: Self-pay | Admitting: Radiation Oncology

## 2019-06-18 ENCOUNTER — Other Ambulatory Visit (HOSPITAL_COMMUNITY): Payer: Self-pay | Admitting: Urology

## 2019-06-18 DIAGNOSIS — D136 Benign neoplasm of pancreas: Secondary | ICD-10-CM

## 2019-06-18 DIAGNOSIS — C61 Malignant neoplasm of prostate: Secondary | ICD-10-CM | POA: Diagnosis not present

## 2019-06-18 NOTE — Progress Notes (Signed)
GU Location of Tumor / Histology: prostatic adenocarcinoma  If Prostate Cancer, Gleason Score is (4 + 3) and PSA is (7.77). Prostate volume: 53 grams.  Allen Haasch was referred by his PCP, Dr. Velna Hatchet to Dr. Lovena Neighbours for further evaluation of an elevated PSA. Patient reports his PSA has been elevated since 2017.   04/20/2019 PSA 5.87  Biopsies of prostate (if applicable) revealed:    Past/Anticipated interventions by urology, if any: prostate biopsy, CT chest/abd/pelvis, bone scan (negative), referral to Dr. Tammi Klippel to discuss radiotherapy options  Past/Anticipated interventions by medical oncology, if any: no  Weight changes, if any: no  Bowel/Bladder complaints, if any: Reports good force of stream. Denies having the sensation of not emptying his bladder completely.Reports occasionally urinary urgency. Reports nocturia x 1. Reports occasional urinary frequency. Denies dysuria or hematuria. Reports nocturia x 1.  IPSS 1. SHIM 22. Denies urinary leakage or incontinence. Denies any bowel complaints.  Nausea/Vomiting, if any: no  Pain issues, if any:  Joint pain  SAFETY ISSUES:  Prior radiation? denies  Pacemaker/ICD? denies  Possible current pregnancy? no, male patient  Is the patient on methotrexate? denies  Current Complaints / other details:  68 year old male. Married with two daughters and 3 sons. Works as a Engineer, civil (consulting). NKDA. Resides in [place].

## 2019-06-19 ENCOUNTER — Encounter: Payer: Self-pay | Admitting: Radiation Oncology

## 2019-06-19 ENCOUNTER — Ambulatory Visit
Admission: RE | Admit: 2019-06-19 | Discharge: 2019-06-19 | Disposition: A | Discharge status: Home / Self Care | Payer: BC Managed Care – PPO | Source: Ambulatory Visit | Attending: Radiation Oncology | Admitting: Radiation Oncology

## 2019-06-19 ENCOUNTER — Other Ambulatory Visit: Payer: Self-pay

## 2019-06-19 VITALS — Ht 67.0 in | Wt 215.0 lb

## 2019-06-19 DIAGNOSIS — R972 Elevated prostate specific antigen [PSA]: Secondary | ICD-10-CM | POA: Diagnosis not present

## 2019-06-19 DIAGNOSIS — C61 Malignant neoplasm of prostate: Secondary | ICD-10-CM | POA: Diagnosis not present

## 2019-06-19 HISTORY — DX: Malignant neoplasm of prostate: C61

## 2019-06-19 NOTE — Progress Notes (Signed)
Radiation Oncology         [phone] ________________________________  Initial outpatient Consultation - Conducted via FaceTime due to current COVID-19 concerns for limiting patient exposure  Name: Glen Freeman MRN: [id number]  Date: 06/19/2019  DOB: Apr 29, 1951  CC:Glen Hatchet, MD  Davis Gourd*   REFERRING PHYSICIAN: Davis Gourd*  DIAGNOSIS: 68 y.o. gentleman with Stage T1c adenocarcinoma of the prostate with Gleason score of 4+3, and PSA of 5.87.    ICD-10-CM   1. Malignant neoplasm of prostate ([place])  C61     HISTORY OF PRESENT ILLNESS: Glen Freeman is a 68 y.o. male with a diagnosis of prostate cancer. He was noted to have an elevated PSA of 7.77 by his primary care physician, Dr. Ardeth Perfect, who notes the patient's PSA has been elevated since 2017 but no prior biopsies.  Accordingly, he was referred for evaluation in urology by Dr. Lovena Neighbours on 04/19/2019,  digital rectal examination was performed at that time was normal.  A repeat PSA was performed on 04/20/2019 and was decreased but remained elevated at 5.87.  Given the longstanding history of elevated PSA without prior biopsy evaluation, the patient elected to proceed to transrectal ultrasound with 12 biopsies of the prostate on 05/24/2019.  The prostate volume measured 53 cc.  Out of 12 core biopsies, 7 were positive.  The maximum Gleason score was 4+3, and this was seen in left base lateral, left mid lateral, left base, right mid lateral, and right apex lateral. Gleason 3+4 was seen in right apex, and 3+3 in right base.  Disease staging imaging was performed with a bone scan on 06/08/2019, which showed no evidence of prostate cancer skeletal metastasis. CT A/P performed the same day showed a cluster of lymph nodes within the left inguinal region which measure 1.4 cm, no additional enlarged lymph nodes identified or other evidence of visceral metastatic disease.  He was evaluated with Dr. Gloriann Loan on 06/18/2019 to  discuss potential surgical options and is scheduled for MRI abdomen for further evaluation/surgical planning on 07/03/2019.  The patient reviewed the biopsy results with his urologist and he has kindly been referred today for discussion of potential radiation treatment options.   PREVIOUS RADIATION THERAPY: No  PAST MEDICAL HISTORY:  Past Medical History:  Diagnosis Date  . Diverticulosis   . Elevated blood pressure   . GERD (gastroesophageal reflux disease)   . History of nephrolithiasis   . Prostate cancer ([place])   . Venous insufficiency    L leg; with saphenous varicositis, sees Dr. Tinnie Gens       PAST SURGICAL HISTORY: Past Surgical History:  Procedure Laterality Date  . COLONOSCOPY  02-02-13   per Dr. Sharlett Iles, clear, repeat in 10 yrs   . endovascular laser ablation  2010   of veins in L eg per Dr. Kellie Simmering 8/11  . Rt knee reconstruction  1970   injured playing football  . superficial thrombophebitis     MEDICAL HX: L leg 11/06/08. on Coumadin for 6 months     FAMILY HISTORY:  Family History  Problem Relation Age of Onset  . Breast cancer Other        family hx - 1st degree relatvie <50  . Diabetes Other        1st degree relative  . Hypertension Other   . Prostate cancer Other        1st degree relative <50   . Colon cancer Neg Hx     SOCIAL HISTORY:  Social  History   Socioeconomic History  . Marital status: Married    Spouse name: Not on file  . Number of children: 5  . Years of education: Not on file  . Highest education level: Not on file  Occupational History    Comment: full time  Social Needs  . Financial resource strain: Not on file  . Food insecurity    Worry: Not on file    Inability: Not on file  . Transportation needs    Medical: Not on file    Non-medical: Not on file  Tobacco Use  . Smoking status: Never Smoker  . Smokeless tobacco: Never Used  Substance and Sexual Activity  . Alcohol use: Yes    Alcohol/week: 0.0 standard drinks     Comment: occ  . Drug use: No  . Sexual activity: Not Currently  Lifestyle  . Physical activity    Days per week: Not on file    Minutes per session: Not on file  . Stress: Not on file  Relationships  . Social Herbalist on phone: Not on file    Gets together: Not on file    Attends religious service: Not on file    Active member of club or organization: Not on file    Attends meetings of clubs or organizations: Not on file    Relationship status: Not on file  . Intimate partner violence    Fear of current or ex partner: Not on file    Emotionally abused: Not on file    Physically abused: Not on file    Forced sexual activity: Not on file  Other Topics Concern  . Not on file  Social History Narrative   Married. Has 2 daughters and 3 sons. NKDA. Resides in [place], [place]. Works as a Engineer, civil (consulting).    ALLERGIES: Patient has no known allergies.  MEDICATIONS:  Current Outpatient Medications  Medication Sig Dispense Refill  . amLODipine (NORVASC) 5 MG tablet Take 5 mg by mouth at bedtime.    Marland Kitchen aspirin 81 MG EC tablet Take 81 mg by mouth 3 (three) times a week.     . esomeprazole (NEXIUM) 40 MG capsule Take by mouth.    . fluticasone (FLONASE) 50 MCG/ACT nasal spray Place 1 spray into both nostrils as needed.  6  . ibuprofen (ADVIL,MOTRIN) 200 MG tablet Take 200 mg by mouth every 6 (six) hours as needed.    . loratadine (CLARITIN) 10 MG tablet Take 1 tablet by mouth daily.  3  . Multiple Vitamin (MULTIVITAMIN) tablet Take 1 tablet by mouth as needed.     Marland Kitchen albuterol (VENTOLIN HFA) 108 (90 Base) MCG/ACT inhaler Inhale into the lungs.     No current facility-administered medications for this encounter.     REVIEW OF SYSTEMS:  On review of systems, the patient reports that he is doing well overall. He denies any chest pain, shortness of breath, cough, fevers, chills, night sweats, unintended weight changes. He denies any bowel disturbances, and denies abdominal pain, nausea  or vomiting. He denies any new musculoskeletal or joint aches or pains. His IPSS was 1, indicating very mild urinary symptoms. He reports occasional urinary urgency, occasional frequency, and nocturia x1. His SHIM was 22, indicating he has very mild erectile dysfunction. A complete review of systems is obtained and is otherwise negative.    PHYSICAL EXAM:  Wt Readings from Last 3 Encounters:  06/19/19 215 lb (97.5 kg)  05/12/16 219 lb (99.3 kg)  04/16/16 219 lb 6 oz (99.5 kg)   Temp Readings from Last 3 Encounters:  07/24/17 98.6 F (37 C) (Oral)  05/12/16 98.2 F (36.8 C)  09/24/15 98.6 F (37 C)   BP Readings from Last 3 Encounters:  07/24/17 (!) 153/88  05/12/16 130/85  04/16/16 (!) 162/90   Pulse Readings from Last 3 Encounters:  07/24/17 84  05/12/16 66  04/16/16 68   Pain Assessment Pain Score: 0-No pain/10  In general this is a well appearing African-American male in no acute distress. He's alert and oriented x4 and appropriate throughout the examination. Cardiopulmonary assessment is negative for acute distress and he exhibits normal effort.    KPS = 90  100 - Normal; no complaints; no evidence of disease. 90   - Able to carry on normal activity; minor signs or symptoms of disease. 80   - Normal activity with effort; some signs or symptoms of disease. 33   - Cares for self; unable to carry on normal activity or to do active work. 60   - Requires occasional assistance, but is able to care for most of his personal needs. 50   - Requires considerable assistance and frequent medical care. 27   - Disabled; requires special care and assistance. 14   - Severely disabled; hospital admission is indicated although death not imminent. 62   - Very sick; hospital admission necessary; active supportive treatment necessary. 10   - Moribund; fatal processes progressing rapidly. 0     - Dead  Karnofsky DA, Abelmann Weakley, Craver LS and [facility] [phone]) The use of the nitrogen  mustards in the palliative treatment of carcinoma: with particular reference to bronchogenic carcinoma Cancer 1 634-56  LABORATORY DATA:  Lab Results  Component Value Date   WBC 4.5 02/13/2015   HGB 14.9 02/13/2015   HCT 44.5 02/13/2015   MCV 83.0 02/13/2015   PLT 215.0 02/13/2015   Lab Results  Component Value Date   NA 137 02/13/2015   K 4.2 02/13/2015   CL 104 02/13/2015   CO2 29 02/13/2015   Lab Results  Component Value Date   ALT 38 02/13/2015   AST 26 02/13/2015   ALKPHOS 53 02/13/2015   BILITOT 0.8 02/13/2015     RADIOGRAPHY: Nm Bone Scan Whole Body  Result Date: 06/08/2019 CLINICAL DATA:  Prostate cancer.  Elevated PSA equal 5.9 EXAM: NUCLEAR MEDICINE WHOLE BODY BONE SCAN TECHNIQUE: Whole body anterior and posterior images were obtained approximately 3 hours after intravenous injection of radiopharmaceutical. RADIOPHARMACEUTICALS:  20.2 mCi Technetium-42m MDP IV COMPARISON:  CT 06/08/2019 FINDINGS: No abnormal accumulation of radiotracer within the axillary appendicular skeleton to localize prostate cancer metastasis. IMPRESSION: No evidence of prostate cancer skeletal metastasis. Electronically Signed   By: Suzy Bouchard M.D.   On: 06/08/2019 16:59      IMPRESSION/PLAN: 1. 68 y.o. gentleman with Stage T1c adenocarcinoma of the prostate with Gleason Score of 4+3, and PSA of 5.87. This visit was conducted via FaceTime to spare the patient unnecessary potential exposure in the healthcare setting during the current COVID-19 pandemic.  We attempted the MyChart virtual visit which he was originally scheduled for but unfortunately the technology would not cooperate on the patient's end. We discussed the patient's workup and outlined the nature of prostate cancer in this setting. The patient's T stage, Gleason's score, and PSA put him into the unfavorable intermediate risk group. He is scheduled to proceed with abdominal MRI for further assessment of lymph node involvement.  At  present, it appears that he is eligible for a variety of potential treatment options. Assuming the nodes are negative, he would be eligible for either brachytherapy or 5.5 weeks of external radiation without the need for ADT.  However, if the lymph nodes prove to show malignancy, this would necessitate a more aggressive treatment approach with ADT in combination with either 8 weeks of external radiation or upfront brachytherapy boost followed by 5 weeks of external radiation. We discussed the available radiation techniques, and focused on the details and logistics of delivery. We discussed and outlined the risks, benefits, short and long-term effects associated with radiotherapy and compared and contrasted these with prostatectomy. We discussed the role of SpaceOAR in reducing the rectal toxicity associated with radiotherapy. We also detailed the role of ADT in the treatment of locally advanced prostate cancer and outlined the associated side effects that could be expected with this therapy.  The patient is currently scheduled to undergo abdominal MRI on 07/03/2019.  We will plan to touch base with the patient following the study to review the results and further treatment recommendations at that time.  If there is not felt to be lymph node involvement, the patient would prefer to proceed with brachytherapy in the absence of ADT. We will share our discussion with Dr. Lovena Neighbours and look forward to following along with the care of this very nice gentleman.  Given current concerns for patient exposure during the COVID-19 pandemic, this encounter was conducted via video-enabled FaceTime visit. The patient was notified in advance and was offered a MyChart meeting but unfortunately reported that he was unable to connect due to technology/connection issues. The patient has given verbal consent for this type of encounter. The time spent during this encounter was 60 minutes. The attendants for this meeting include Tyler Pita MD, [name] Bruning PA-C, [place], patient, Georgios Solla and his wife. During the encounter, Tyler Pita MD, [name] Bruning PA-C, and scribe, Wilburn Mylar were located at [place].  Patient, Sargent Gapp and his wife were located at home.    Nicholos Johns, PA-C    Tyler Pita, MD  [facility] Direct Dial: [phone]  Fax: [phone] [email or web address]  Skype  [email or web address]  This document serves as a record of services personally performed by Tyler Pita, MD and Freeman Caldron, PA-C. It was created on their behalf by Wilburn Mylar, a trained medical scribe. The creation of this record is based on the scribe's personal observations and the provider's statements to them. This document has been checked and approved by the attending provider.

## 2019-06-29 ENCOUNTER — Telehealth: Payer: Self-pay | Admitting: Medical Oncology

## 2019-06-29 NOTE — Telephone Encounter (Signed)
Called Mr. Bacigalupo to introduce myself as the prostate nurse navigator and discuss my role. I spoke with his wife Ruby who was present during the consult.She states the consult went well, but she was concerned that most of the visit was spent with the PA and not Dr. Tammi Klippel. I explained that [name] and Dr. Tammi Klippel work closely and Dr. Tammi Klippel will follow up after his MRI to discuss treatment. I gave her my office number and asked her to call me with questions or concerns.

## 2019-07-03 ENCOUNTER — Other Ambulatory Visit (HOSPITAL_COMMUNITY): Payer: Self-pay | Admitting: Urology

## 2019-07-03 ENCOUNTER — Other Ambulatory Visit: Payer: Self-pay

## 2019-07-03 ENCOUNTER — Telehealth: Payer: Self-pay | Admitting: Medical Oncology

## 2019-07-03 ENCOUNTER — Encounter (HOSPITAL_COMMUNITY): Payer: Self-pay

## 2019-07-03 ENCOUNTER — Ambulatory Visit (HOSPITAL_COMMUNITY)
Admission: RE | Admit: 2019-07-03 | Discharge: 2019-07-03 | Disposition: A | Discharge status: Home / Self Care | Payer: BC Managed Care – PPO | Source: Ambulatory Visit | Attending: Urology | Admitting: Urology

## 2019-07-03 DIAGNOSIS — D136 Benign neoplasm of pancreas: Secondary | ICD-10-CM

## 2019-07-03 NOTE — Telephone Encounter (Signed)
Spoke with Ruby-wife to inform her of new date and time for MRI. MRI will be done at [facility] 9/13 arriving at 10:30 am for 11 am scan. He needs to arrive in the ED and tell them he is for an outpt MRI. He needs to be NPO 4 hours prior and will need a driver due to oral sedation. I informed her I left a message with Dr. Purvis Sheffield office requesting oral ativan. Per MRI staff, they asked patient not take ativan until arrival in registration, incase of an emergency that may delay scan. Ruby voiced understanding of the above. I asked her to call me back with questions or concerns.

## 2019-07-03 NOTE — Telephone Encounter (Signed)
Called Dr. Purvis Sheffield office, spoke with Mickel Baas to inform Dr. Gloriann Loan,  patient was unable to do MRI due to anxiety and needs larger scanner. I asked if he would call in po Ativan for patient to before scan.

## 2019-07-03 NOTE — Telephone Encounter (Signed)
Patient was scheduled for MRI to further evaluate pancreatic cystic lesion. I noted the MRI was not done even though patient arrived.I spoke with his wife, Bertram Millard and she states no one rescheduled the appointment and she is not sure what to to. I will investigate and call her back.

## 2019-07-05 ENCOUNTER — Telehealth: Payer: Self-pay | Admitting: Medical Oncology

## 2019-07-05 NOTE — Telephone Encounter (Signed)
Spoke with wife to let her know Dr. Gloriann Loan called in po ativan to take prior to MRI. She states his pharmacy called to let them it is ready. She thanked me for the follow up call.

## 2019-07-06 DIAGNOSIS — C61 Malignant neoplasm of prostate: Secondary | ICD-10-CM | POA: Diagnosis not present

## 2019-07-08 ENCOUNTER — Ambulatory Visit (HOSPITAL_COMMUNITY)
Admission: RE | Admit: 2019-07-08 | Discharge: 2019-07-08 | Disposition: A | Discharge status: Home / Self Care | Payer: Medicare Other | Source: Ambulatory Visit | Attending: Urology | Admitting: Urology

## 2019-07-08 NOTE — Progress Notes (Signed)
Pt to have MRI 07/08/19, but does not have any recent labs for kidney function such as a recent creatinine. Dr. Must put in an order for the labs, and we need results of labs before we can inject the MRI contrast dye. Unfortunately over the weekend the doctor's office is closed and they do not draw outpatient labs at the hospital on the weekends. Pt's wife notified over the phone to call Dr. On Monday morning to get lab order and to call to  Reschedule MRI abdomen  w/wo as soon as  They get the lab order in.

## 2019-07-09 DIAGNOSIS — K862 Cyst of pancreas: Secondary | ICD-10-CM | POA: Diagnosis not present

## 2019-07-09 DIAGNOSIS — C61 Malignant neoplasm of prostate: Secondary | ICD-10-CM | POA: Diagnosis not present

## 2019-07-10 ENCOUNTER — Other Ambulatory Visit: Payer: Self-pay | Admitting: General Surgery

## 2019-07-10 ENCOUNTER — Ambulatory Visit
Admission: RE | Admit: 2019-07-10 | Discharge: 2019-07-10 | Disposition: A | Discharge status: Home / Self Care | Payer: BC Managed Care – PPO | Source: Ambulatory Visit | Attending: Radiation Oncology | Admitting: Radiation Oncology

## 2019-07-10 ENCOUNTER — Ambulatory Visit
Admission: RE | Admit: 2019-07-10 | Discharge: 2019-07-10 | Disposition: A | Discharge status: Home / Self Care | Payer: BC Managed Care – PPO | Source: Ambulatory Visit | Attending: Urology | Admitting: Urology

## 2019-07-10 ENCOUNTER — Other Ambulatory Visit: Payer: Self-pay

## 2019-07-10 ENCOUNTER — Encounter: Payer: Self-pay | Admitting: Urology

## 2019-07-10 VITALS — Ht 67.0 in | Wt 215.0 lb

## 2019-07-10 DIAGNOSIS — C61 Malignant neoplasm of prostate: Secondary | ICD-10-CM | POA: Diagnosis not present

## 2019-07-10 DIAGNOSIS — R972 Elevated prostate specific antigen [PSA]: Secondary | ICD-10-CM | POA: Diagnosis not present

## 2019-07-10 DIAGNOSIS — K862 Cyst of pancreas: Secondary | ICD-10-CM

## 2019-07-10 DIAGNOSIS — Z8042 Family history of malignant neoplasm of prostate: Secondary | ICD-10-CM | POA: Diagnosis not present

## 2019-07-10 NOTE — Progress Notes (Signed)
Radiation Oncology         [phone] ________________________________  Follow up New visit - Conducted via Doximity video call due to current COVID-19 concerns for limiting patient exposure  Name: Glen Freeman MRN: [id number]  Date: 07/10/2019  DOB: Oct 04, 1951  CC:Velna Hatchet, MD  Velna Hatchet, MD   REFERRING PHYSICIAN: Velna Hatchet, MD  DIAGNOSIS: 68 y.o. gentleman with Stage T1c adenocarcinoma of the prostate with Gleason score of 4+3, and PSA of 5.87.    ICD-10-CM   1. Malignant neoplasm of prostate ([place])  C61     HISTORY OF PRESENT ILLNESS: Glen Freeman is a 68 y.o. male who has a history of elevated PSA since 2017 and was recently noted to have an elevated PSA of 7.77 by his primary care physician, Dr. Ardeth Perfect.  He has not had prior prostate biopsies.  Accordingly, he was referred for evaluation in urology by Dr. Lovena Neighbours on 04/19/2019,  digital rectal examination was performed at that time was normal.  A repeat PSA was performed on 04/20/2019 and was decreased but remained elevated at 5.87.  Given the longstanding history of elevated PSA without prior biopsy evaluation, the patient elected to proceed to transrectal ultrasound with 12 biopsies of the prostate on 05/24/2019.  The prostate volume measured 53 cc.  Out of 12 core biopsies, 7 were positive.  The maximum Gleason score was 4+3, and this was seen in left base lateral, left mid lateral, left base, right mid lateral, and right apex lateral. Gleason 3+4 was seen in right apex, and 3+3 in right base.  Disease staging imaging was performed with a bone scan on 06/08/2019, which showed no evidence of osseous metastasis. CT A/P performed the same day showed a cluster of lymph nodes within the left inguinal region which measure 1.4 cm, no additional enlarged lymph nodes identified or other evidence of visceral metastatic disease.  He was evaluated with Dr. Gloriann Loan on 06/18/2019 to discuss potential surgical options and is scheduled  for MRI abdomen for further evaluation of an incompletely characterized pancreatic cyst.  We initially met with the patient on 06/19/19 to discuss radiation treatment options but he was undecided so he returns today for further discussion. He was scheduled for MRI abdomen on 07/03/19 but missed the appointment and is now being rescheduled.  PREVIOUS RADIATION THERAPY: No  PAST MEDICAL HISTORY:  Past Medical History:  Diagnosis Date   Diverticulosis    Elevated blood pressure    GERD (gastroesophageal reflux disease)    History of nephrolithiasis    Prostate cancer (HCC)    Venous insufficiency    L leg; with saphenous varicositis, sees Dr. Tinnie Gens       PAST SURGICAL HISTORY: Past Surgical History:  Procedure Laterality Date   COLONOSCOPY  02-02-13   per Dr. Sharlett Iles, clear, repeat in 10 yrs    endovascular laser ablation  2010   of veins in L eg per Dr. Kellie Simmering 8/11   Rt knee reconstruction  1970   injured playing football   superficial thrombophebitis     MEDICAL HX: L leg 11/06/08. on Coumadin for 6 months     FAMILY HISTORY:  Family History  Problem Relation Age of Onset   Breast cancer Other        family hx - 1st degree relatvie <50   Diabetes Other        1st degree relative   Hypertension Other    Prostate cancer Other  1st degree relative <50    Colon cancer Neg Hx     SOCIAL HISTORY:  Social History   Socioeconomic History   Marital status: Married    Spouse name: Not on file   Number of children: 5   Years of education: Not on file   Highest education level: Not on file  Occupational History    Comment: full time  Social Designer, fashion/clothing strain: Not on file   Food insecurity    Worry: Not on file    Inability: Not on file   Transportation needs    Medical: Not on file    Non-medical: Not on file  Tobacco Use   Smoking status: Never Smoker   Smokeless tobacco: Never Used  Substance and Sexual Activity     Alcohol use: Yes    Alcohol/week: 0.0 standard drinks    Comment: occ   Drug use: No   Sexual activity: Not Currently  Lifestyle   Physical activity    Days per week: Not on file    Minutes per session: Not on file   Stress: Not on file  Relationships   Social connections    Talks on phone: Not on file    Gets together: Not on file    Attends religious service: Not on file    Active member of club or organization: Not on file    Attends meetings of clubs or organizations: Not on file    Relationship status: Not on file   Intimate partner violence    Fear of current or ex partner: Not on file    Emotionally abused: Not on file    Physically abused: Not on file    Forced sexual activity: Not on file  Other Topics Concern   Not on file  Social History Narrative   Married. Has 2 daughters and 3 sons. NKDA. Resides in [place], [place]. Works as a Engineer, civil (consulting).    ALLERGIES: Patient has no known allergies.  MEDICATIONS:  Current Outpatient Medications  Medication Sig Dispense Refill   albuterol (VENTOLIN HFA) 108 (90 Base) MCG/ACT inhaler Inhale into the lungs.     amLODipine (NORVASC) 5 MG tablet Take 5 mg by mouth at bedtime.     Ascorbic Acid (VITAMIN C) 1000 MG tablet Take 1,000 mg by mouth daily.     aspirin 81 MG EC tablet Take 81 mg by mouth 3 (three) times a week.      esomeprazole (NEXIUM) 40 MG capsule Take by mouth.     fluticasone (FLONASE) 50 MCG/ACT nasal spray Place 1 spray into both nostrils as needed.  6   ibuprofen (ADVIL,MOTRIN) 200 MG tablet Take 200 mg by mouth every 6 (six) hours as needed.     magnesium 30 MG tablet Take 30 mg by mouth 2 (two) times daily.     Multiple Vitamin (MULTIVITAMIN) tablet Take 1 tablet by mouth as needed.      No current facility-administered medications for this encounter.     REVIEW OF SYSTEMS:  On review of systems, the patient reports that he is doing well overall. He denies any chest pain, shortness of  breath, cough, fevers, chills, night sweats, unintended weight changes. He denies any bowel disturbances, and denies abdominal pain, nausea or vomiting. He denies any new musculoskeletal or joint aches or pains. His IPSS was 1, indicating very mild urinary symptoms. He reports occasional urinary urgency, occasional frequency, and nocturia x1. His SHIM was 22, indicating he has  very mild erectile dysfunction. A complete review of systems is obtained and is otherwise negative.    PHYSICAL EXAM:  Wt Readings from Last 3 Encounters:  07/10/19 215 lb (97.5 kg)  06/19/19 215 lb (97.5 kg)  05/12/16 219 lb (99.3 kg)   Temp Readings from Last 3 Encounters:  07/24/17 98.6 F (37 C) (Oral)  05/12/16 98.2 F (36.8 C)  09/24/15 98.6 F (37 C)   BP Readings from Last 3 Encounters:  07/24/17 (!) 153/88  05/12/16 130/85  04/16/16 (!) 162/90   Pulse Readings from Last 3 Encounters:  07/24/17 84  05/12/16 66  04/16/16 68   Pain Assessment Pain Score: 0-No pain/10  In general this is a well appearing African-American male in no acute distress. He's alert and oriented x4 and appropriate throughout the examination. Cardiopulmonary assessment is negative for acute distress and he exhibits normal effort.    KPS = 90  100 - Normal; no complaints; no evidence of disease. 90   - Able to carry on normal activity; minor signs or symptoms of disease. 80   - Normal activity with effort; some signs or symptoms of disease. 20   - Cares for self; unable to carry on normal activity or to do active work. 60   - Requires occasional assistance, but is able to care for most of his personal needs. 50   - Requires considerable assistance and frequent medical care. 54   - Disabled; requires special care and assistance. 69   - Severely disabled; hospital admission is indicated although death not imminent. 66   - Very sick; hospital admission necessary; active supportive treatment necessary. 10   - Moribund; fatal  processes progressing rapidly. 0     - Dead  Karnofsky DA, Abelmann [place], Craver LS and Burchenal [facility] [phone]) The use of the nitrogen mustards in the palliative treatment of carcinoma: with particular reference to bronchogenic carcinoma Cancer 1 634-56  LABORATORY DATA:  Lab Results  Component Value Date   WBC 4.5 02/13/2015   HGB 14.9 02/13/2015   HCT 44.5 02/13/2015   MCV 83.0 02/13/2015   PLT 215.0 02/13/2015   Lab Results  Component Value Date   NA 137 02/13/2015   K 4.2 02/13/2015   CL 104 02/13/2015   CO2 29 02/13/2015   Lab Results  Component Value Date   ALT 38 02/13/2015   AST 26 02/13/2015   ALKPHOS 53 02/13/2015   BILITOT 0.8 02/13/2015     RADIOGRAPHY: No results found.    IMPRESSION/PLAN: 1. 68 y.o. gentleman with Stage T1c adenocarcinoma of the prostate with Gleason Score of 4+3, and PSA of 5.87. This visit was conducted via Doximity video call to spare the patient unnecessary potential exposure in the healthcare setting during the current COVID-19 pandemic.   Today, we reviewed the patient's workup and outlined the nature of prostate cancer in this setting. The patient's T stage, Gleason's score, and PSA put him into the unfavorable intermediate risk group.   Accordingly, he is eligible for a variety of potential treatment options including either brachytherapy or 5.5 weeks of external radiation with/without ST-ADT.  We discussed the available radiation techniques, and focused on the details and logistics of delivery. We discussed and outlined the risks, benefits, short and long-term effects associated with radiotherapy and compared and contrasted these with prostatectomy. We discussed the role of SpaceOAR in reducing the rectal toxicity associated with radiotherapy. We also detailed the role of ADT in the treatment of unfavorable intermediate  risk prostate cancer and outlined the associated side effects that could be expected with this therapy. It is felt that the  toxicities associated with ADT and the negative impact it has on quality of life for patient's with Gleason 7 disease and PSA below 10 may outweigh the overall benefits of the treatment. Following a lengthy discussion of this topic, the patient prefers to avoid ADT at this time and only consider the use of ADT should his PSA continue to rise despite treatment.   At the conclusion of our conversation today, the patient elects to proceed with brachytherapy and use of SpaceOAR gel to reduce rectal toxicity from radiotherapy.  He would prefer to proceed with brachytherapy in the absence of ADT. We will share our discussion with Dr. Lovena Neighbours and move forward with scheduling his CT [facility] planning appointment in the near future.  The patient will be connected with Romie Jumper in our office who will be working closely with him to coordinate OR scheduling and pre and post procedure appointments.  We will contact the pharmaceutical rep to ensure that [place] is available at the time of procedure.  He will have a prostate MRI following his post-seed CT SIM to confirm appropriate distribution of the [place].  Given current concerns for patient exposure during the COVID-19 pandemic, this encounter was conducted via Doximity video-enabled visit. The patient was notified in advance and was offered a MyChart meeting but unfortunately reported that he was unable to connect due to technology/connection issues. The patient has given verbal consent for this type of encounter. The time spent during this encounter was 45 minutes. The attendants for this meeting include Tyler Pita MD, Keri Veale PA-C, patient, Jakyron Fabro and his wife. During the encounter, Tyler Pita MD and Freeman Caldron PA-C were located at [facility] Radiation Oncology Department.  Patient, Avrian Delfavero and his wife were located at home.    Nicholos Johns, PA-C    Tyler Pita, MD  [facility]  Oncology Direct Dial: [phone]   Fax: [phone] [email or web address]   Skype   [email or web address]  This document serves as a record of services personally performed by Tyler Pita, MD and Freeman Caldron, PA-C. It was created on their behalf by Wilburn Mylar, a trained medical scribe. The creation of this record is based on the scribe's personal observations and the provider's statements to them. This document has been checked and approved by the attending provider.

## 2019-07-10 NOTE — Progress Notes (Signed)
See progress note under physician encounter. 

## 2019-07-11 ENCOUNTER — Telehealth: Payer: Self-pay | Admitting: *Deleted

## 2019-07-11 NOTE — Telephone Encounter (Signed)
CALLED PATIENT TO ASK QUESTIONS, SPOKE WITH PATIENT'S WIFE- RUBY

## 2019-07-12 ENCOUNTER — Telehealth: Payer: Self-pay | Admitting: *Deleted

## 2019-07-12 NOTE — Telephone Encounter (Signed)
CALLED PATIENT TO INFORM OF PRE-SEED PLANNING CT FOR 07-26-19, SPOKE WITH PATIENT'S WIFE- RUBY AND SHE IS AWARE OF THIS APPT.

## 2019-07-18 ENCOUNTER — Telehealth: Payer: Self-pay | Admitting: Radiation Oncology

## 2019-07-18 NOTE — Telephone Encounter (Signed)
Phoned patient's home. Spoke with wife, Bertram Millard. Explained that per [name] Bruning,PA-C her husband can move forward with getting needed vaccinations from a radiation standpoint. Ruby verbalized understanding and expressed appreciation for the return call.

## 2019-07-18 NOTE — Telephone Encounter (Signed)
-----   Message from Freeman Caldron, [place] sent at 07/17/2019  5:18 PM EDT ----- Regarding: RE: Vaccinations Yes!!!  For sure!!!! -[name] ----- Message ----- From: Heywood Footman, RN Sent: 07/17/2019   3:03 PM EDT To: Freeman Caldron, PA-C Subject: Vaccinations                                   [name].   [name] Adam's wife, Glen Freeman, called to see if he could get "all the vaccinations he needs". She mentioned flu, shingles, hep c. He is scheduled for a pre seed planning CT on 07/26/19.   68 y.o. gentleman with Stage T1c adenocarcinoma of the prostate with Gleason score of 4+3, and PSA of 5.87.  Also, if you have a standard approach for answering these questions just let me know. I hate bothering you with things such as this.   [name]

## 2019-07-25 ENCOUNTER — Telehealth: Payer: Self-pay | Admitting: *Deleted

## 2019-07-25 NOTE — Telephone Encounter (Signed)
CALLED PATIENT TO REMND OF PRE-SEED SCAN FOR 07-26-19 - ARRIVAL TIME- 9:15 AM @ [facility], SPOKE WITH PATIENT AND HE IS AWARE OF THIS APPT.

## 2019-07-26 ENCOUNTER — Ambulatory Visit
Admission: RE | Admit: 2019-07-26 | Discharge: 2019-07-26 | Disposition: A | Discharge status: Home / Self Care | Payer: BC Managed Care – PPO | Source: Ambulatory Visit | Attending: Radiation Oncology | Admitting: Radiation Oncology

## 2019-07-26 ENCOUNTER — Other Ambulatory Visit: Payer: Self-pay

## 2019-07-26 ENCOUNTER — Ambulatory Visit: Payer: Self-pay | Admitting: Urology

## 2019-07-26 ENCOUNTER — Ambulatory Visit
Admission: RE | Admit: 2019-07-26 | Discharge: 2019-07-26 | Disposition: A | Discharge status: Home / Self Care | Payer: BC Managed Care – PPO | Source: Ambulatory Visit | Attending: Urology | Admitting: Urology

## 2019-07-26 ENCOUNTER — Encounter: Payer: Self-pay | Admitting: Medical Oncology

## 2019-07-26 DIAGNOSIS — C61 Malignant neoplasm of prostate: Secondary | ICD-10-CM | POA: Diagnosis not present

## 2019-07-26 NOTE — Progress Notes (Signed)
  Radiation Oncology         [phone]) [phone] ________________________________  Name: Glen Freeman MRN: [id number]  Date: 07/26/2019  DOB: 1951/05/26  SIMULATION AND TREATMENT PLANNING NOTE PUBIC ARCH STUDY  [id number], Nicki Reaper, MD  Velna Hatchet, MD  DIAGNOSIS: 68 y.o. gentleman with Stage T1c adenocarcinoma of the prostate with Gleason score of 4+3, and PSA of 5.87     ICD-10-CM   1. Malignant neoplasm of prostate ([place])  C61     COMPLEX SIMULATION:  The patient presented today for evaluation for possible prostate seed implant. He was brought to the radiation planning suite and placed supine on the CT couch. A 3-dimensional image study set was obtained in upload to the planning computer. There, on each axial slice, I contoured the prostate gland. Then, using three-dimensional radiation planning tools I reconstructed the prostate in view of the structures from the transperineal needle pathway to assess for possible pubic arch interference. In doing so, I did not appreciate any pubic arch interference. Also, the patient's prostate volume was estimated based on the drawn structure. The volume was 53 cc.  Given the pubic arch appearance and prostate volume, patient remains a good candidate to proceed with prostate seed implant. Today, he freely provided informed written consent to proceed.    PLAN: The patient will undergo prostate seed implant.   ________________________________  Sheral Apley. Tammi Klippel, M.D.

## 2019-07-30 ENCOUNTER — Telehealth: Payer: Self-pay | Admitting: *Deleted

## 2019-07-30 NOTE — Telephone Encounter (Signed)
Called patient to inform of implant on 08-24-19, spoke with patient's wifeBertram Freeman and she is aware of this procedure

## 2019-07-31 ENCOUNTER — Telehealth: Payer: Self-pay | Admitting: *Deleted

## 2019-07-31 ENCOUNTER — Other Ambulatory Visit: Payer: Self-pay | Admitting: Urology

## 2019-07-31 NOTE — Telephone Encounter (Signed)
CALLED PATIENT TO INFORM OF CHEST X-RAY AND EKG FOR 08-01-19 - ARRIVAL TIME- 9:45 AM @ [facility] ADMITTING, LVM FOR A RETURN CALL

## 2019-08-01 ENCOUNTER — Other Ambulatory Visit: Payer: Self-pay

## 2019-08-01 ENCOUNTER — Ambulatory Visit (HOSPITAL_COMMUNITY)
Admission: RE | Admit: 2019-08-01 | Discharge: 2019-08-01 | Disposition: A | Discharge status: Home / Self Care | Payer: BC Managed Care – PPO | Source: Ambulatory Visit | Attending: Urology | Admitting: Urology

## 2019-08-01 ENCOUNTER — Encounter (HOSPITAL_COMMUNITY)
Admission: RE | Admit: 2019-08-01 | Discharge: 2019-08-01 | Disposition: A | Discharge status: Home / Self Care | Payer: BC Managed Care – PPO | Source: Ambulatory Visit | Attending: Urology | Admitting: Urology

## 2019-08-01 DIAGNOSIS — C61 Malignant neoplasm of prostate: Secondary | ICD-10-CM | POA: Diagnosis not present

## 2019-08-01 DIAGNOSIS — R05 Cough: Secondary | ICD-10-CM | POA: Diagnosis not present

## 2019-08-03 ENCOUNTER — Ambulatory Visit
Admission: RE | Admit: 2019-08-03 | Discharge: 2019-08-03 | Disposition: A | Discharge status: Home / Self Care | Payer: BC Managed Care – PPO | Source: Ambulatory Visit | Attending: General Surgery | Admitting: General Surgery

## 2019-08-03 DIAGNOSIS — K862 Cyst of pancreas: Secondary | ICD-10-CM | POA: Diagnosis not present

## 2019-08-03 MED ORDER — GADOBENATE DIMEGLUMINE 529 MG/ML IV SOLN
20.0000 mL | Freq: Once | INTRAVENOUS | Status: AC | PRN
  Administered 2019-08-03: 20 mL via INTRAVENOUS

## 2019-08-08 NOTE — Progress Notes (Signed)
Please let patient know lesion looks benign.  Will get repeat MR abd wo/w contrast with mrcp in 1 year.

## 2019-08-17 ENCOUNTER — Other Ambulatory Visit: Payer: Self-pay | Admitting: Urology

## 2019-08-17 DIAGNOSIS — C61 Malignant neoplasm of prostate: Secondary | ICD-10-CM

## 2019-08-21 ENCOUNTER — Encounter (HOSPITAL_COMMUNITY)
Admission: RE | Admit: 2019-08-21 | Discharge: 2019-08-21 | Disposition: A | Discharge status: Home / Self Care | Payer: BC Managed Care – PPO | Source: Ambulatory Visit | Attending: Urology | Admitting: Urology

## 2019-08-21 ENCOUNTER — Other Ambulatory Visit (HOSPITAL_COMMUNITY): Payer: Medicare Other

## 2019-08-21 ENCOUNTER — Telehealth: Payer: Self-pay | Admitting: *Deleted

## 2019-08-21 ENCOUNTER — Other Ambulatory Visit (HOSPITAL_COMMUNITY)
Admission: RE | Admit: 2019-08-21 | Discharge: 2019-08-21 | Disposition: A | Discharge status: Home / Self Care | Payer: BC Managed Care – PPO | Source: Ambulatory Visit | Attending: Urology | Admitting: Urology

## 2019-08-21 ENCOUNTER — Other Ambulatory Visit: Payer: Self-pay

## 2019-08-21 DIAGNOSIS — Z7982 Long term (current) use of aspirin: Secondary | ICD-10-CM | POA: Diagnosis not present

## 2019-08-21 DIAGNOSIS — N368 Other specified disorders of urethra: Secondary | ICD-10-CM | POA: Diagnosis not present

## 2019-08-21 DIAGNOSIS — Z20828 Contact with and (suspected) exposure to other viral communicable diseases: Secondary | ICD-10-CM | POA: Insufficient documentation

## 2019-08-21 DIAGNOSIS — K219 Gastro-esophageal reflux disease without esophagitis: Secondary | ICD-10-CM | POA: Diagnosis not present

## 2019-08-21 DIAGNOSIS — C61 Malignant neoplasm of prostate: Secondary | ICD-10-CM | POA: Diagnosis not present

## 2019-08-21 DIAGNOSIS — Z79899 Other long term (current) drug therapy: Secondary | ICD-10-CM | POA: Diagnosis not present

## 2019-08-21 DIAGNOSIS — Z8672 Personal history of thrombophlebitis: Secondary | ICD-10-CM | POA: Diagnosis not present

## 2019-08-21 DIAGNOSIS — Z791 Long term (current) use of non-steroidal anti-inflammatories (NSAID): Secondary | ICD-10-CM | POA: Diagnosis not present

## 2019-08-21 DIAGNOSIS — Z01812 Encounter for preprocedural laboratory examination: Secondary | ICD-10-CM | POA: Diagnosis not present

## 2019-08-21 DIAGNOSIS — Z8249 Family history of ischemic heart disease and other diseases of the circulatory system: Secondary | ICD-10-CM | POA: Diagnosis not present

## 2019-08-21 DIAGNOSIS — I10 Essential (primary) hypertension: Secondary | ICD-10-CM | POA: Diagnosis not present

## 2019-08-21 LAB — CBC
HCT: 44.4 % (ref 39.0–52.0)
Hemoglobin: 14.1 g/dL (ref 13.0–17.0)
MCH: 27.4 pg (ref 26.0–34.0)
MCHC: 31.8 g/dL (ref 30.0–36.0)
MCV: 86.2 fL (ref 80.0–100.0)
Platelets: 215 10*3/uL (ref 150–400)
RBC: 5.15 MIL/uL (ref 4.22–5.81)
RDW: 14.6 % (ref 11.5–15.5)
WBC: 4.5 10*3/uL (ref 4.0–10.5)
nRBC: 0 % (ref 0.0–0.2)

## 2019-08-21 LAB — COMPREHENSIVE METABOLIC PANEL
ALT: 29 U/L (ref 0–44)
AST: 23 U/L (ref 15–41)
Albumin: 4 g/dL (ref 3.5–5.0)
Alkaline Phosphatase: 55 U/L (ref 38–126)
Anion gap: 9 (ref 5–15)
BUN: 16 mg/dL (ref 8–23)
CO2: 25 mmol/L (ref 22–32)
Calcium: 9 mg/dL (ref 8.9–10.3)
Chloride: 106 mmol/L (ref 98–111)
Creatinine, Ser: 1.01 mg/dL (ref 0.61–1.24)
GFR calc Af Amer: >60 mL/min (ref >60)
GFR calc non Af Amer: >60 mL/min (ref >60)
Glucose, Bld: 110 mg/dL — ABNORMAL HIGH (ref 70–99)
Potassium: 3.8 mmol/L (ref 3.5–5.1)
Sodium: 140 mmol/L (ref 135–145)
Total Bilirubin: 0.6 mg/dL (ref 0.3–1.2)
Total Protein: 6.9 g/dL (ref 6.5–8.1)

## 2019-08-21 LAB — PROTIME-INR
INR: 1 (ref 0.8–1.2)
Prothrombin Time: 12.7 seconds (ref 11.4–15.2)

## 2019-08-21 LAB — APTT: aPTT: 35 seconds (ref 24–36)

## 2019-08-21 NOTE — Telephone Encounter (Signed)
Called patient to remind of lab and COVID testing for 08-21-19, spoke with patient and he is aware of these tests

## 2019-08-22 LAB — NOVEL CORONAVIRUS, NAA (HOSP ORDER, SEND-OUT TO REF LAB; TAT 18-24 HRS): SARS-CoV-2, NAA: NOT DETECTED

## 2019-08-23 ENCOUNTER — Other Ambulatory Visit: Payer: Self-pay

## 2019-08-23 ENCOUNTER — Telehealth: Payer: Self-pay | Admitting: *Deleted

## 2019-08-23 ENCOUNTER — Encounter (HOSPITAL_BASED_OUTPATIENT_CLINIC_OR_DEPARTMENT_OTHER): Payer: Self-pay | Admitting: *Deleted

## 2019-08-23 NOTE — Progress Notes (Signed)
Spoke w/ via phone for pre-op interview--- Pt's wife, Bertram Millard  Lab results------  CBC, CMP, PT/PTT done 08-21-2019 in chart/ epic:  Current ekg/ cxr in chart/ epic COVID test ------ 08-21-2019 Arrive at ------- 1115 NPO after ------ MN w/ exception clear liquids until 0700 then nothing by mouth (no cream/ milk products). Medications to take morning of surgery ----- Prilosec w/ sips of water Diabetic medication ----- n/a Patient Special Instructions ----- do one fleet enema morning of surgery Pre-Op special Istructions ----- n/a Patient verbalized understanding of instructions that were given at this phone interview. Patient denies shortness of breath, chest pain, fever, cough a this phone interview.

## 2019-08-23 NOTE — Telephone Encounter (Signed)
Called patient to remind of procedure for 08-24-19, spoke with patient's wife and she is aware of this procedure

## 2019-08-24 ENCOUNTER — Ambulatory Visit (HOSPITAL_BASED_OUTPATIENT_CLINIC_OR_DEPARTMENT_OTHER): Payer: BC Managed Care – PPO | Admitting: Vascular Surgery

## 2019-08-24 ENCOUNTER — Ambulatory Visit (HOSPITAL_COMMUNITY): Payer: BC Managed Care – PPO

## 2019-08-24 ENCOUNTER — Encounter (HOSPITAL_BASED_OUTPATIENT_CLINIC_OR_DEPARTMENT_OTHER)
Admission: RE | Disposition: A | Discharge status: Home / Self Care | Payer: Self-pay | Source: Home / Self Care | Attending: Urology

## 2019-08-24 ENCOUNTER — Ambulatory Visit (HOSPITAL_BASED_OUTPATIENT_CLINIC_OR_DEPARTMENT_OTHER)
Admission: RE | Admit: 2019-08-24 | Discharge: 2019-08-24 | Disposition: A | Discharge status: Home / Self Care | Payer: BC Managed Care – PPO | Attending: Urology | Admitting: Urology

## 2019-08-24 ENCOUNTER — Other Ambulatory Visit: Payer: Self-pay

## 2019-08-24 ENCOUNTER — Ambulatory Visit (HOSPITAL_BASED_OUTPATIENT_CLINIC_OR_DEPARTMENT_OTHER): Payer: BC Managed Care – PPO | Admitting: Anesthesiology

## 2019-08-24 ENCOUNTER — Encounter (HOSPITAL_BASED_OUTPATIENT_CLINIC_OR_DEPARTMENT_OTHER): Payer: Self-pay | Admitting: Anesthesiology

## 2019-08-24 DIAGNOSIS — I10 Essential (primary) hypertension: Secondary | ICD-10-CM | POA: Diagnosis not present

## 2019-08-24 DIAGNOSIS — Z8672 Personal history of thrombophlebitis: Secondary | ICD-10-CM | POA: Insufficient documentation

## 2019-08-24 DIAGNOSIS — Z7982 Long term (current) use of aspirin: Secondary | ICD-10-CM | POA: Diagnosis not present

## 2019-08-24 DIAGNOSIS — C61 Malignant neoplasm of prostate: Secondary | ICD-10-CM | POA: Diagnosis not present

## 2019-08-24 DIAGNOSIS — K219 Gastro-esophageal reflux disease without esophagitis: Secondary | ICD-10-CM | POA: Diagnosis not present

## 2019-08-24 DIAGNOSIS — I8290 Acute embolism and thrombosis of unspecified vein: Secondary | ICD-10-CM | POA: Diagnosis not present

## 2019-08-24 DIAGNOSIS — N368 Other specified disorders of urethra: Secondary | ICD-10-CM | POA: Diagnosis not present

## 2019-08-24 DIAGNOSIS — Z791 Long term (current) use of non-steroidal anti-inflammatories (NSAID): Secondary | ICD-10-CM | POA: Diagnosis not present

## 2019-08-24 DIAGNOSIS — Z8249 Family history of ischemic heart disease and other diseases of the circulatory system: Secondary | ICD-10-CM | POA: Insufficient documentation

## 2019-08-24 DIAGNOSIS — Z79899 Other long term (current) drug therapy: Secondary | ICD-10-CM | POA: Diagnosis not present

## 2019-08-24 HISTORY — DX: Personal history of urinary calculi: Z87.442

## 2019-08-24 HISTORY — PX: CYSTOSCOPY: SHX5120

## 2019-08-24 HISTORY — PX: SPACE OAR INSTILLATION: SHX6769

## 2019-08-24 HISTORY — PX: RADIOACTIVE SEED IMPLANT: SHX5150

## 2019-08-24 HISTORY — DX: Personal history of thrombophlebitis: Z86.72

## 2019-08-24 HISTORY — DX: Allergic rhinitis, unspecified: J30.9

## 2019-08-24 HISTORY — DX: Nocturia: R35.1

## 2019-08-24 HISTORY — DX: Essential (primary) hypertension: I10

## 2019-08-24 SURGERY — INSERTION, RADIATION SOURCE, PROSTATE
Anesthesia: General | Site: Rectum

## 2019-08-24 MED ORDER — PROPOFOL 10 MG/ML IV BOLUS
INTRAVENOUS | Status: AC
  Filled 2019-08-24: qty 20

## 2019-08-24 MED ORDER — PHENAZOPYRIDINE HCL 200 MG PO TABS: 200.0000 mg | ORAL_TABLET | Freq: Three times a day (TID) | ORAL | 0 refills | Status: AC | PRN

## 2019-08-24 MED ORDER — EPHEDRINE SULFATE-NACL 50-0.9 MG/10ML-% IV SOSY
PREFILLED_SYRINGE | INTRAVENOUS | Status: DC | PRN
  Administered 2019-08-24: 10 mg via INTRAVENOUS
  Administered 2019-08-24: 5 mg via INTRAVENOUS
  Administered 2019-08-24: 10 mg via INTRAVENOUS

## 2019-08-24 MED ORDER — CIPROFLOXACIN IN D5W 400 MG/200ML IV SOLN
400.0000 mg | INTRAVENOUS | Status: AC
  Administered 2019-08-24: 400 mg via INTRAVENOUS
  Filled 2019-08-24: qty 200

## 2019-08-24 MED ORDER — PHENYLEPHRINE 40 MCG/ML (10ML) SYRINGE FOR IV PUSH (FOR BLOOD PRESSURE SUPPORT)
PREFILLED_SYRINGE | INTRAVENOUS | Status: AC
  Filled 2019-08-24: qty 10

## 2019-08-24 MED ORDER — ONDANSETRON HCL 4 MG/2ML IJ SOLN
INTRAMUSCULAR | Status: AC
  Filled 2019-08-24: qty 2

## 2019-08-24 MED ORDER — SODIUM CHLORIDE (PF) 0.9 % IJ SOLN
INTRAMUSCULAR | Status: DC | PRN
  Administered 2019-08-24: 10 mL via INTRAVENOUS

## 2019-08-24 MED ORDER — FENTANYL CITRATE (PF) 100 MCG/2ML IJ SOLN
25.0000 ug | INTRAMUSCULAR | Status: DC | PRN
  Filled 2019-08-24: qty 1

## 2019-08-24 MED ORDER — LIDOCAINE 2% (20 MG/ML) 5 ML SYRINGE
INTRAMUSCULAR | Status: AC
  Filled 2019-08-24: qty 5

## 2019-08-24 MED ORDER — ONDANSETRON HCL 4 MG/2ML IJ SOLN
INTRAMUSCULAR | Status: DC | PRN
  Administered 2019-08-24: 4 mg via INTRAVENOUS

## 2019-08-24 MED ORDER — LIDOCAINE 2% (20 MG/ML) 5 ML SYRINGE
INTRAMUSCULAR | Status: DC | PRN
  Administered 2019-08-24 (×2): 40 mg via INTRAVENOUS

## 2019-08-24 MED ORDER — OXYCODONE HCL 5 MG/5ML PO SOLN
5.0000 mg | Freq: Once | ORAL | Status: AC | PRN
  Filled 2019-08-24: qty 5

## 2019-08-24 MED ORDER — ACETAMINOPHEN 500 MG PO TABS
ORAL_TABLET | ORAL | Status: AC
  Filled 2019-08-24: qty 2

## 2019-08-24 MED ORDER — PROPOFOL 10 MG/ML IV BOLUS
INTRAVENOUS | Status: DC | PRN
  Administered 2019-08-24 (×2): 30 mg via INTRAVENOUS
  Administered 2019-08-24: 140 mg via INTRAVENOUS

## 2019-08-24 MED ORDER — OXYCODONE HCL 5 MG PO TABS
5.0000 mg | ORAL_TABLET | Freq: Once | ORAL | Status: AC | PRN
  Administered 2019-08-24: 5 mg via ORAL
  Filled 2019-08-24: qty 1

## 2019-08-24 MED ORDER — FENTANYL CITRATE (PF) 100 MCG/2ML IJ SOLN
INTRAMUSCULAR | Status: AC
  Filled 2019-08-24: qty 2

## 2019-08-24 MED ORDER — OXYCODONE HCL 5 MG PO TABS
ORAL_TABLET | ORAL | Status: AC
  Filled 2019-08-24: qty 1

## 2019-08-24 MED ORDER — FENTANYL CITRATE (PF) 100 MCG/2ML IJ SOLN
INTRAMUSCULAR | Status: DC | PRN
  Administered 2019-08-24 (×4): 25 ug via INTRAVENOUS
  Administered 2019-08-24: 75 ug via INTRAVENOUS
  Administered 2019-08-24: 25 ug via INTRAVENOUS

## 2019-08-24 MED ORDER — CIPROFLOXACIN IN D5W 400 MG/200ML IV SOLN
INTRAVENOUS | Status: AC
  Filled 2019-08-24: qty 200

## 2019-08-24 MED ORDER — TRAMADOL HCL 50 MG PO TABS: 50.0000 mg | ORAL_TABLET | Freq: Four times a day (QID) | ORAL | 0 refills | Status: AC | PRN

## 2019-08-24 MED ORDER — IOHEXOL 300 MG/ML  SOLN
INTRAMUSCULAR | Status: DC | PRN
  Administered 2019-08-24: 7 mL

## 2019-08-24 MED ORDER — ACETAMINOPHEN 500 MG PO TABS
1000.0000 mg | ORAL_TABLET | Freq: Once | ORAL | Status: AC
  Administered 2019-08-24: 1000 mg via ORAL
  Filled 2019-08-24: qty 2

## 2019-08-24 MED ORDER — BELLADONNA ALKALOIDS-OPIUM 16.2-30 MG RE SUPP
30.0000 mg | Freq: Once | RECTAL | Status: DC
  Filled 2019-08-24: qty 1

## 2019-08-24 MED ORDER — ACETAMINOPHEN 500 MG PO TABS
1000.0000 mg | ORAL_TABLET | Freq: Once | ORAL | Status: DC
  Filled 2019-08-24: qty 2

## 2019-08-24 MED ORDER — MIDAZOLAM HCL 2 MG/2ML IJ SOLN
INTRAMUSCULAR | Status: AC
  Filled 2019-08-24: qty 2

## 2019-08-24 MED ORDER — DEXAMETHASONE SODIUM PHOSPHATE 10 MG/ML IJ SOLN
INTRAMUSCULAR | Status: AC
  Filled 2019-08-24: qty 1

## 2019-08-24 MED ORDER — STERILE WATER FOR INJECTION IJ SOLN
INTRAMUSCULAR | Status: DC | PRN
  Administered 2019-08-24: 14:00:00 3 mL

## 2019-08-24 MED ORDER — GENTAMICIN SULFATE 40 MG/ML IJ SOLN
5.0000 mg/kg | Freq: Once | INTRAVENOUS | Status: AC
  Administered 2019-08-24: 340 mg via INTRAVENOUS
  Filled 2019-08-24: qty 8.5

## 2019-08-24 MED ORDER — PHENYLEPHRINE 40 MCG/ML (10ML) SYRINGE FOR IV PUSH (FOR BLOOD PRESSURE SUPPORT)
PREFILLED_SYRINGE | INTRAVENOUS | Status: DC | PRN
  Administered 2019-08-24: 80 ug via INTRAVENOUS
  Administered 2019-08-24: 120 ug via INTRAVENOUS

## 2019-08-24 MED ORDER — FLEET ENEMA 7-19 GM/118ML RE ENEM
1.0000 | ENEMA | Freq: Once | RECTAL | Status: AC
  Administered 2019-08-24: 1 via RECTAL
  Filled 2019-08-24: qty 1

## 2019-08-24 MED ORDER — MIDAZOLAM HCL 5 MG/5ML IJ SOLN
INTRAMUSCULAR | Status: DC | PRN
  Administered 2019-08-24: 1 mg via INTRAVENOUS

## 2019-08-24 MED ORDER — EPHEDRINE 5 MG/ML INJ
INTRAVENOUS | Status: AC
  Filled 2019-08-24: qty 10

## 2019-08-24 MED ORDER — DEXAMETHASONE SODIUM PHOSPHATE 4 MG/ML IJ SOLN
INTRAMUSCULAR | Status: DC | PRN
  Administered 2019-08-24: 4 mg via INTRAVENOUS

## 2019-08-24 MED ORDER — LACTATED RINGERS IV SOLN
INTRAVENOUS | Status: DC
  Administered 2019-08-24 (×3): via INTRAVENOUS
  Filled 2019-08-24: qty 1000

## 2019-08-24 MED ORDER — BELLADONNA ALKALOIDS-OPIUM 16.2-30 MG RE SUPP
RECTAL | Status: AC
  Filled 2019-08-24: qty 1

## 2019-08-24 MED ORDER — PROMETHAZINE HCL 25 MG/ML IJ SOLN
6.2500 mg | INTRAMUSCULAR | Status: DC | PRN
  Filled 2019-08-24: qty 1

## 2019-08-24 SURGICAL SUPPLY — 34 items
BAG URINE DRAINAGE (UROLOGICAL SUPPLIES) ×4 IMPLANT
BLADE CLIPPER SENSICLIP SURGIC (BLADE) ×4 IMPLANT
CATH COUDE FOLEY 2W 5CC 16FR (CATHETERS) ×1 IMPLANT
CATH FOLEY 2WAY SLVR  5CC 16FR (CATHETERS) ×1
CATH FOLEY 2WAY SLVR 5CC 16FR (CATHETERS) ×3 IMPLANT
CATH ROBINSON RED A/P 16FR (CATHETERS) IMPLANT
CATH ROBINSON RED A/P 20FR (CATHETERS) ×4 IMPLANT
CLOTH BEACON ORANGE TIMEOUT ST (SAFETY) ×3 IMPLANT
CONT SPECI 4OZ STER CLIK (MISCELLANEOUS) ×8 IMPLANT
COVER BACK TABLE 60X90IN (DRAPES) ×4 IMPLANT
COVER MAYO STAND STRL (DRAPES) ×4 IMPLANT
DRSG TEGADERM 4X4.75 (GAUZE/BANDAGES/DRESSINGS) ×7 IMPLANT
DRSG TEGADERM 8X12 (GAUZE/BANDAGES/DRESSINGS) ×7 IMPLANT
GAUZE SPONGE 4X4 12PLY STRL LF (GAUZE/BANDAGES/DRESSINGS) ×1 IMPLANT
GLOVE BIO SURGEON STRL SZ7.5 (GLOVE) ×6 IMPLANT
GLOVE BIO SURGEON STRL SZ8 (GLOVE) IMPLANT
GLOVE SURG ORTHO 8.5 STRL (GLOVE) ×4 IMPLANT
GLOVE SURG SS PI 6.5 STRL IVOR (GLOVE) IMPLANT
GOWN STRL REUS W/TWL LRG LVL3 (GOWN DISPOSABLE) ×5 IMPLANT
GOWN STRL REUS W/TWL XL LVL3 (GOWN DISPOSABLE) ×4 IMPLANT
HOLDER FOLEY CATH W/STRAP (MISCELLANEOUS) IMPLANT
I-Seed AgX100 ×74 IMPLANT
IMPL SPACEOAR SYSTEM 10ML (Spacer) ×3 IMPLANT
IMPLANT SPACEOAR SYSTEM 10ML (Spacer) ×4 IMPLANT
IV SOD CHL 0.9% 1000ML (IV SOLUTION) ×3 IMPLANT
KIT TURNOVER CYSTO (KITS) ×4 IMPLANT
MARKER SKIN DUAL TIP RULER LAB (MISCELLANEOUS) ×4 IMPLANT
PACK CYSTO (CUSTOM PROCEDURE TRAY) ×4 IMPLANT
SURGILUBE 2OZ TUBE FLIPTOP (MISCELLANEOUS) ×4 IMPLANT
SUT BONE WAX W31G (SUTURE) IMPLANT
SYR 10ML LL (SYRINGE) ×4 IMPLANT
TOWEL OR 17X26 10 PK STRL BLUE (TOWEL DISPOSABLE) ×4 IMPLANT
UNDERPAD 30X30 (UNDERPADS AND DIAPERS) ×8 IMPLANT
WATER STERILE IRR 500ML POUR (IV SOLUTION) ×4 IMPLANT

## 2019-08-24 NOTE — Transfer of Care (Signed)
Last Vitals:  Vitals Value Taken Time  BP 139/90 08/24/19 1458  Temp    Pulse 90 08/24/19 1502  Resp 20 08/24/19 1502  SpO2 96 % 08/24/19 1502  Vitals shown include unvalidated device data.  Last Pain:  Vitals:   08/24/19 1210  TempSrc: Oral  PainSc: 0-No pain      Patients Stated Pain Goal: 5 (08/24/19 1210)  Immediate Anesthesia Transfer of Care Note  Patient: Glen Freeman  Procedure(s) Performed: Procedure(s) (LRB): RADIOACTIVE SEED IMPLANT/BRACHYTHERAPY IMPLANT (N/A) SPACE OAR INSTILLATION (N/A) CYSTOSCOPY (N/A)  Patient Location: PACU  Anesthesia Type: General  Level of Consciousness:drowsy  Airway & Oxygen Therapy: Patient Spontanous Breathing and Patient connected to nasal cannula oxygen  Post-op Assessment: Report given to PACU RN and Post -op Vital signs reviewed and stable  Post vital signs: Reviewed and stable  Complications: No apparent anesthesia complications

## 2019-08-24 NOTE — Op Note (Signed)
PATIENT:  Glen Freeman  PRE-OPERATIVE DIAGNOSIS:  Adenocarcinoma of the prostate  POST-OPERATIVE DIAGNOSIS:  Same  PROCEDURE:  1. I-125 radioactive seed implantation 2. Cystoscopy  3. Placement of SpaceOAR  SURGEON:  Ellison Hughs, MD  Radiation oncologist: Tyler Pita, MD  ANESTHESIA:  General  EBL:  Minimal  DRAINS: None  INDICATION: Glen Freeman  Description of procedure: After informed consent the patient was brought to the major OR, placed on the table and administered general anesthesia. He was then moved to the modified lithotomy position with his perineum perpendicular to the floor. His perineum and genitalia were then sterilely prepped. An official timeout was then performed. A 16 French Foley catheter was then placed in the bladder and filled with dilute contrast, a rectal tube was placed in the rectum and the transrectal ultrasound probe was placed in the rectum and affixed to the stand. He was then sterilely draped.  Real time ultrasonography was used along with the seed planning software. This was used to develop the seed plan including the number of needles as well as number of seeds required for complete and adequate coverage. Real-time ultrasonography was then used along with the previously developed plan and the Nucletron device to implant a total of 74 seeds using 22 needles. This proceeded without difficulty or complication.   I then proceeded with placement of SpaceOAR by introducing a needle with the bevel angled inferiorly approximately 2 cm superior to the anus. This was angled downward and under direct ultrasound was placed within the space between the prostatic capsule and rectum. This was confirmed with a small amount of sterile saline injected and this was performed under direct ultrasound. I then attached the SpaceOAR to the needle and injected this in the space between the prostate and rectum with good placement noted.  A Foley catheter was then  removed as well as the transrectal ultrasound probe and rectal probe. Flexible cystoscopy was then performed using the 17 French flexible scope which revealed a normal urethra throughout its length down to the sphincter which appeared intact. The prostatic urethra revealed bilobar hypertrophy but no evidence of obstruction, seeds, spacers or lesions. The bladder was then entered and fully and systematically inspected. The ureteral orifices were noted to be of normal configuration and position. The mucosa revealed no evidence of tumors. There were also no stones identified within the bladder. I noted no seeds or spacers on the floor of the bladder and retroflexion of the scope revealed no seeds protruding from the base of the prostate.  The cystoscope was then removed and the patient was awakened and taken to recovery room in stable and satisfactory condition. He tolerated procedure well and there were no intraoperative complications.   Plan: Follow-up in 1 month for a routine checkup and in 4 months for a PSA.

## 2019-08-24 NOTE — Anesthesia Preprocedure Evaluation (Addendum)
Anesthesia Evaluation  Patient identified by MRN, date of birth, ID band Patient awake    Reviewed: Allergy & Precautions, NPO status , Patient's Chart, lab work & pertinent test results  History of Anesthesia Complications Negative for: history of anesthetic complications  Airway Mallampati: II  TM Distance: >3 FB Neck ROM: Full    Dental no notable dental hx.    Pulmonary neg pulmonary ROS,    Pulmonary exam normal        Cardiovascular hypertension, Pt. on medications Normal cardiovascular exam  ECG: NSR, rate 68. Normal sinus rhythm Left axis deviation   Neuro/Psych negative neurological ROS     GI/Hepatic Neg liver ROS, GERD  Medicated and Controlled,  Endo/Other  negative endocrine ROS  Renal/GU negative Renal ROS     Musculoskeletal negative musculoskeletal ROS (+)   Abdominal   Peds  Hematology negative hematology ROS (+)   Anesthesia Other Findings PROSTATE CANCER  Reproductive/Obstetrics                           Anesthesia Physical Anesthesia Plan  ASA: II  Anesthesia Plan: General   Post-op Pain Management:    Induction: Intravenous  PONV Risk Score and Plan: 2 and Treatment may vary due to age or medical condition, Ondansetron, Dexamethasone and Midazolam  Airway Management Planned: LMA  Additional Equipment: None  Intra-op Plan:   Post-operative Plan: Extubation in OR  Informed Consent: I have reviewed the patients History and Physical, chart, labs and discussed the procedure including the risks, benefits and alternatives for the proposed anesthesia with the patient or authorized representative who has indicated his/her understanding and acceptance.     Dental advisory given  Plan Discussed with: CRNA  Anesthesia Plan Comments:        Anesthesia Quick Evaluation

## 2019-08-24 NOTE — Anesthesia Procedure Notes (Addendum)
Procedure Name: LMA Insertion Date/Time: 08/24/2019 1:25 PM Performed by: Mechele Claude, CRNA Pre-anesthesia Checklist: Patient identified, Emergency Drugs available, Suction available and Patient being monitored Patient Re-evaluated:Patient Re-evaluated prior to induction Oxygen Delivery Method: Circle system utilized Preoxygenation: Pre-oxygenation with 100% oxygen Induction Type: IV induction Ventilation: Mask ventilation without difficulty LMA: LMA inserted LMA Size: 4.0 Number of attempts: 1 Airway Equipment and Method: Bite block Placement Confirmation: positive ETCO2 Tube secured with: Tape Dental Injury: Teeth and Oropharynx as per pre-operative assessment

## 2019-08-24 NOTE — Discharge Instructions (Signed)
Brachytherapy for Prostate Cancer, Care After ° °This sheet gives you information about how to care for yourself after your procedure. Your health care provider may also give you more specific instructions. If you have problems or questions, contact your health care provider. °What can I expect after the procedure? °After the procedure, it is common to have: °· Trouble passing urine. °· Blood in the urine or semen. °· Constipation. °· Frequent feeling of an urgent need to urinate. °· Bruising, swelling, and tenderness of the area behind the scrotum (perineum). °· Bloating and gas. °· Fatigue. °· Burning or pain in the rectum. °· Problems getting or keeping an erection (erectile dysfunction). °· Nausea. °Follow these instructions at home: °Managing pain, stiffness, and swelling °· If directed, apply ice to the affected area: °? Put ice in a plastic bag. °? Place a towel between your skin and the bag. °? Leave the ice on for 20 minutes, 2-3 times a day. °· Try not to sit directly on the area behind the scrotum. A soft cushion can help with discomfort. °Activity °· Do not drive for 24 hours if you were given a medicine to help you relax (sedative). °· Do not drive or use heavy machinery while taking prescription pain medicine. °· Rest as told by your health care provider. °· Most people can return to normal activities a few days or weeks after the procedure. Ask your health care provider what activities are safe for you. °Eating and drinking °· Drink enough fluid to keep your urine clear or pale yellow. °· Eat a healthy, balanced diet. This includes lean proteins, whole grains, and plenty of fruits and vegetables. °General instructions °· Take over-the-counter and prescription medicines only as told by your health care provider. °· Keep all follow-up visits as told by your health care provider. This is important. You may still need additional treatment. °· Do not take baths, swim, or use a hot tub until your health  care provider approves. Shower and wash the area behind the scrotum gently. °· Do not have sex for one week after the treatment, or until your health care provider approves. °· If you have permanent, low-dose brachytherapy implants: °? Limit close contact with children and pregnant women for 2 months or as told by your health care provider. This is important because of the radiation that is still active in the prostate. °? You may set off radioactive sensors, such as airport screenings. Ask your health care provider for a document that explains your treatment. °? You may be instructed to use a condom during sex for the first 2 months after low-dose brachytherapy. °Contact a health care provider if: °· You have a fever or chills. °· You do not have a bowel movement for 3-4 days after the procedure. °· You have diarrhea for 3-4 days after the procedure. °· You develop any new symptoms, such as problems with urinating or erectile dysfunction. °· You have abdomen (abdominal) pain. °· You have more blood in your urine. °Get help right away if: °· You cannot urinate. °· There is excessive bleeding from your rectum. °· You have unusual drainage coming from your rectum. °· You have severe pain in the treated area that does not go away with pain medicine. °· You have severe nausea or vomiting. °Summary °· If you have permanent, low-dose brachytherapy implants, limit close contact with children and pregnant women for 2 months or as told by your health care provider. This is important because of the radiation   that is still active in the prostate.  Talk with your health care provider about your risk of brachytherapy side effects, such as erectile dysfunction or urinary problems. Your health care provider will be able to recommend possible treatment options.  Keep all follow-up visits as told by your health care provider. This is important. You may need additional treatment. This information is not intended to replace  advice given to you by your health care provider. Make sure you discuss any questions you have with your health care provider. Document Released: 11/13/2010 Document Revised: 09/23/2017 Document Reviewed: 11/12/2016 Elsevier Patient Education  [place] Instructions  Activity: Get plenty of rest for the remainder of the day. A responsible adult should stay with you for 24 hours following the procedure.  For the next 24 hours, DO NOT: -Drive a car -Paediatric nurse -Drink alcoholic beverages -Take any medication unless instructed by your physician -Make any legal decisions or sign important papers.  Meals: Start with liquid foods such as gelatin or soup. Progress to regular foods as tolerated. Avoid greasy, spicy, heavy foods. If nausea and/or vomiting occur, drink only clear liquids until the nausea and/or vomiting subsides. Call your physician if vomiting continues.  Special Instructions/Symptoms: Your throat may feel dry or sore from the anesthesia or the breathing tube placed in your throat during surgery. If this causes discomfort, gargle with warm salt water. The discomfort should disappear within 24 hours.  If you had a scopolamine patch placed behind your ear for the management of post- operative nausea and/or vomiting:  1. The medication in the patch is effective for 72 hours, after which it should be removed.  Wrap patch in a tissue and discard in the trash. Wash hands thoroughly with soap and water. 2. You may remove the patch earlier than 72 hours if you experience unpleasant side effects which may include dry mouth, dizziness or visual disturbances. 3. Avoid touching the patch. Wash your hands with soap and water after contact with the patch.

## 2019-08-24 NOTE — H&P (Signed)
Urology Preoperative H&P   Chief Complaint: Prostate cancer  History of Present Illness: Kani Macko is a 68 y.o. male with T1c, Gleason 4+3=7 prostate cancer that was diagnosed on 05/24/19.  He is here today for brachytherapy seed and Space OAR placement as primary treatment of his intermediate risk prostate cancer.  He denies any new or worsening urinary symptoms.  He denies dysuria, hematuria or interval UTIs.    Last PSA- 5.87 (03/2019) SHIM-12 AUASS-3/1   Past Medical History:  Diagnosis Date  . Allergic rhinitis   . Diverticulosis   . GERD (gastroesophageal reflux disease)   . History of kidney stones   . History of thrombophlebitis    2010  superficial lower extremity,  completed 6 months coumadin  . Hypertension    followed by pcp   (08-23-2019 per has never had a stress test)  . Nocturia   . Prostate cancer [facility]) urologist-- dr Shaden Higley/ oncologist-- dr Tammi Klippel   dx 05-24-2019--- Stage T1c,  Gleason 4+3  . Venous insufficiency    Left  leg; with saphenous varicositis,     Past Surgical History:  Procedure Laterality Date  . COLONOSCOPY  last one 05-12-2016  . endovascular laser ablation  2010   of veins in Left  leg per Dr. Kellie Simmering  . KNEE RECONSTRUCTION Right 1970   football injury    Allergies: No Known Allergies  Family History  Problem Relation Age of Onset  . Breast cancer Other        family hx - 1st degree relatvie <50  . Diabetes Other        1st degree relative  . Hypertension Other   . Prostate cancer Other        1st degree relative <50   . Colon cancer Neg Hx     Social History:  reports that he has never smoked. He has never used smokeless tobacco. He reports previous alcohol use. He reports that he does not use drugs.  ROS: A complete review of systems was performed.  All systems are negative except for pertinent findings as noted.  Physical Exam:  Vital signs in last 24 hours: Weight:  [81.6 kg] 81.6 kg (10/29 1722) Constitutional:   Alert and oriented, No acute distress Cardiovascular: Regular rate and rhythm, No JVD Respiratory: Normal respiratory effort, Lungs clear bilaterally GI: Abdomen is soft, nontender, nondistended, no abdominal masses GU: No CVA tenderness Lymphatic: No lymphadenopathy Neurologic: Grossly intact, no focal deficits Psychiatric: Normal mood and affect  Laboratory Data:  No results for input(s): WBC, HGB, HCT, PLT in the last 72 hours.  No results for input(s): NA, K, CL, GLUCOSE, BUN, CALCIUM, CREATININE in the last 72 hours.  Invalid input(s): CO3   No results found for this or any previous visit (from the past 24 hour(s)). Recent Results (from the past 240 hour(s))  Novel Coronavirus, NAA (Hosp order, Send-out to Ref Lab; TAT 18-24 hrs     Status: None   Collection Time: 08/21/19 12:20 PM   Specimen: Nasopharyngeal Swab; Respiratory  Result Value Ref Range Status   SARS-CoV-2, NAA NOT DETECTED NOT DETECTED Final    Comment: (NOTE) This nucleic acid amplification test was developed and its performance characteristics determined by [employer]. Nucleic acid amplification tests include PCR and TMA. This test has not been FDA cleared or approved. This test has been authorized by FDA under an Emergency Use Authorization (EUA). This test is only authorized for the duration of time the declaration that circumstances exist  justifying the authorization of the emergency use of in vitro diagnostic tests for detection of SARS-CoV-2 virus and/or diagnosis of COVID-19 infection under section 564(b)(1) of the Act, 21 U.S.C. [id number]) (1), unless the authorization is terminated or revoked sooner. When diagnostic testing is negative, the possibility of a false negative result should be considered in the context of a patient's recent exposures and the presence of clinical signs and symptoms consistent with COVID-19. An individual without symptoms of COVID- 19 and who is not shedding  SARS-CoV-2 vi rus would expect to have a negative (not detected) result in this assay. Performed At: [facility] [address] [place], [place] [id number] Rush Farmer MD [id number]    [place]  Final    Comment: Performed at [facility], [place] [address] [place], [place] [address]    Renal Function: Recent Labs    08/21/19 1126  CREATININE 1.01   Estimated Creatinine Clearance: 70.6 mL/min (by C-G formula based on SCr of 1.01 mg/dL).  Radiologic Imaging: No results found.  I independently reviewed the above imaging studies.  Assessment and Plan Levester Ristic is a 68 y.o. male with Gleason 4+3 prostate cancer  -The risks, benefits and alternatives of brachytherapy seed and Space OAR placement was discussed with the patient.  Risks include, but are not limited, bleeding, infection, worsening lower urinary tract symptoms, radiation cystitis, radiation proctitis, rectal injury, urinary fistula formation, secondary malignancies, MI, CVA, PE, DVT and the inherent risks of general anesthesia.  He voices understanding and wishes to proceed.     Ellison Hughs, MD 08/24/2019, 11:54 AM  [facility] Pager: [phone]

## 2019-08-25 NOTE — Progress Notes (Signed)
  Radiation Oncology         [phone] ________________________________  Name: [facility] MRN: [id number]  Date: 08/25/2019  DOB: 02/14/1951       Prostate Seed Implant  CC:Velna Hatchet, MD  No ref. provider found  DIAGNOSIS: 68 y.o. gentleman with Stage T1c adenocarcinoma of the prostate with Gleason score of 4+3, and PSA of 5.87    ICD-10-CM   1. Prostate cancer ([place])  C61 DG Chest 2 View    DG Chest 2 View    PROCEDURE: Insertion of radioactive I-125 seeds into the prostate gland.  RADIATION DOSE: 145 Gy, definitive therapy.  TECHNIQUE: [facility] was brought to the operating room with the urologist. He was placed in the dorsolithotomy position. He was catheterized and a rectal tube was inserted. The perineum was shaved, prepped and draped. The ultrasound probe was then introduced into the rectum to see the prostate gland.  TREATMENT DEVICE: A needle grid was attached to the ultrasound probe stand and anchor needles were placed.  3D PLANNING: The prostate was imaged in 3D using a sagittal sweep of the prostate probe. These images were transferred to the planning computer. There, the prostate, urethra and rectum were defined on each axial reconstructed image. Then, the software created an optimized 3D plan and a few seed positions were adjusted. The quality of the plan was reviewed using [facility] information for the target and the following two organs at risk:  Urethra and Rectum.  Then the accepted plan was printed and handed off to the radiation therapist.  Under my supervision, the custom loading of the seeds and spacers was carried out and loaded into sealed vicryl sleeves.  These pre-loaded needles were then placed into the needle holder.Marland Kitchen  PROSTATE VOLUME STUDY:  Using transrectal ultrasound the volume of the prostate was verified to be 57.8 cc.  SPECIAL TREATMENT PROCEDURE/SUPERVISION AND HANDLING: The pre-loaded needles were then delivered under sagittal guidance. A  total of 22 needles were used to deposit 74 seeds in the prostate gland. The individual seed activity was 0.570 mCi.  SpaceOAR:  Yes  COMPLEX SIMULATION: At the end of the procedure, an anterior radiograph of the pelvis was obtained to document seed positioning and count. Cystoscopy was performed to check the urethra and bladder.  MICRODOSIMETRY: At the end of the procedure, the patient was emitting 0.121 mR/hr at 1 meter. Accordingly, he was considered safe for hospital discharge.  PLAN: The patient will return to the radiation oncology clinic for post implant CT dosimetry in three weeks.   ________________________________  Sheral Apley Tammi Klippel, M.D.

## 2019-08-25 NOTE — Anesthesia Postprocedure Evaluation (Signed)
Anesthesia Post Note  Patient: [facility]  Procedure(s) Performed: RADIOACTIVE SEED IMPLANT/BRACHYTHERAPY IMPLANT (N/A Prostate) SPACE OAR INSTILLATION (N/A Rectum) CYSTOSCOPY (N/A Bladder)     Patient location during evaluation: PACU Anesthesia Type: General Level of consciousness: awake and alert and oriented Pain management: pain level controlled Vital Signs Assessment: post-procedure vital signs reviewed and stable Respiratory status: spontaneous breathing, nonlabored ventilation and respiratory function stable Cardiovascular status: blood pressure returned to baseline Postop Assessment: no apparent nausea or vomiting Anesthetic complications: no                 Brennan Bailey

## 2019-08-27 ENCOUNTER — Encounter (HOSPITAL_BASED_OUTPATIENT_CLINIC_OR_DEPARTMENT_OTHER): Payer: Self-pay | Admitting: Urology

## 2019-09-10 ENCOUNTER — Telehealth: Payer: Self-pay | Admitting: *Deleted

## 2019-09-10 NOTE — Telephone Encounter (Signed)
Called patient to inform of post seed appts. and MRI for 09-14-19, spoke with patient's wife- Ruby and lvm for a return call

## 2019-09-10 NOTE — Telephone Encounter (Signed)
Called patient to inform of post seed appts. For 09-13-19 and MRI for 09-14-19 - arrival time- 7:30 am @ [facility], spoke with patient and he is aware of these appts.

## 2019-09-11 ENCOUNTER — Telehealth: Payer: Self-pay | Admitting: *Deleted

## 2019-09-11 NOTE — Telephone Encounter (Signed)
Called patient to inform that post seed appts. for 09-13-19 has been moved to  09-19-19, and his MRI will be on Nov. 27 - arrival time- 2:30 pm @ [facility], spoke with patient's wifeBertram Millard and she is aware of these appts.

## 2019-09-13 ENCOUNTER — Ambulatory Visit: Payer: Medicare Other | Admitting: Urology

## 2019-09-13 ENCOUNTER — Ambulatory Visit: Payer: BC Managed Care – PPO | Admitting: Radiation Oncology

## 2019-09-14 ENCOUNTER — Ambulatory Visit (HOSPITAL_COMMUNITY): Payer: BC Managed Care – PPO

## 2019-09-18 ENCOUNTER — Telehealth: Payer: Self-pay | Admitting: *Deleted

## 2019-09-18 NOTE — Telephone Encounter (Signed)
CALLED PATIENT TO REMIND OF POST SEED APPTS. FOR 09-19-19 AND HIS MRI FOR 09-21-19 @ [facility], SPOKE WITH PATIENT'S WIFE- RUBY AND SHE IS AWARE OF THESE APPTS.

## 2019-09-19 ENCOUNTER — Ambulatory Visit
Admission: RE | Admit: 2019-09-19 | Discharge: 2019-09-19 | Disposition: A | Discharge status: Home / Self Care | Payer: BC Managed Care – PPO | Source: Ambulatory Visit | Attending: Urology | Admitting: Urology

## 2019-09-19 ENCOUNTER — Ambulatory Visit
Admission: RE | Admit: 2019-09-19 | Discharge: 2019-09-19 | Disposition: A | Discharge status: Home / Self Care | Payer: BC Managed Care – PPO | Source: Ambulatory Visit | Attending: Radiation Oncology | Admitting: Radiation Oncology

## 2019-09-19 ENCOUNTER — Other Ambulatory Visit: Payer: Self-pay

## 2019-09-19 DIAGNOSIS — Z79899 Other long term (current) drug therapy: Secondary | ICD-10-CM | POA: Diagnosis not present

## 2019-09-19 DIAGNOSIS — R3911 Hesitancy of micturition: Secondary | ICD-10-CM | POA: Diagnosis not present

## 2019-09-19 DIAGNOSIS — R350 Frequency of micturition: Secondary | ICD-10-CM | POA: Insufficient documentation

## 2019-09-19 DIAGNOSIS — C61 Malignant neoplasm of prostate: Secondary | ICD-10-CM | POA: Insufficient documentation

## 2019-09-19 DIAGNOSIS — Z923 Personal history of irradiation: Secondary | ICD-10-CM | POA: Diagnosis not present

## 2019-09-19 DIAGNOSIS — Z7982 Long term (current) use of aspirin: Secondary | ICD-10-CM | POA: Diagnosis not present

## 2019-09-19 NOTE — Progress Notes (Signed)
Radiation Oncology         [phone] ________________________________  Name: [facility] MRN: [id number]  Date: 09/19/2019  DOB: July 14, 1951  Post-Seed Follow-Up Visit Note  CC: Velna Hatchet, MD  Velna Hatchet, MD  Diagnosis:   68 y.o. gentleman with Stage T1c adenocarcinoma of the prostate with Gleason score of 4+3, and PSA of 5.87    ICD-10-CM   1. Malignant neoplasm of prostate ([place])  C61     Interval Since Last Radiation:  3.5 weeks 08/24/19:  Insertion of radioactive I-125 seeds into the prostate gland; 145 Gy, definitive/boost therapy with placement of SpaceOAR gel.  Narrative:  The patient returns today for routine follow-up.  He is complaining of increased urinary frequency and urinary hesitation symptoms. He filled out a questionnaire regarding urinary function today providing and overall IPSS score of 23 characterizing his symptoms as severe with increased frequency, urgency, weak stream and nocturia 3-4x/night.  He feels that these symptoms are gradually improving and he continued to use Pyridium once daily to help with his stream and dysuria. He feels that he is emptying his bladder well on voiding at this point and is overall pleased with his progress to date.  His pre-implant score was 1. He denies any abdominal pain or bowel symptoms.  ALLERGIES:  has No Known Allergies.  Meds: Current Outpatient Medications  Medication Sig Dispense Refill  . albuterol (VENTOLIN HFA) 108 (90 Base) MCG/ACT inhaler Inhale into the lungs every 4 (four) hours as needed.     Marland Kitchen amLODipine (NORVASC) 5 MG tablet Take 5 mg by mouth at bedtime.     . Ascorbic Acid (VITAMIN C) 1000 MG tablet Take 1,000 mg by mouth daily.    Marland Kitchen aspirin 81 MG EC tablet Take 81 mg by mouth 3 (three) times a week.     . fluticasone (FLONASE) 50 MCG/ACT nasal spray Place 1 spray into both nostrils as needed.  6  . guaiFENesin (COUGH SYRUP PO) Take by mouth as needed.    Marland Kitchen ibuprofen (ADVIL,MOTRIN) 200 MG  tablet Take 200 mg by mouth every 6 (six) hours as needed.    . magnesium 30 MG tablet Take 30 mg by mouth 2 (two) times daily.    . Multiple Vitamin (MULTIVITAMIN) tablet Take 1 tablet by mouth as needed.     Marland Kitchen omeprazole (PRILOSEC) 20 MG capsule Take 20 mg by mouth as needed.    . phenazopyridine (PYRIDIUM) 200 MG tablet Take 1 tablet (200 mg total) by mouth 3 (three) times daily as needed (for pain with urination). 30 tablet 0   No current facility-administered medications for this encounter.     Physical Findings: In general this is a well appearing African American male in no acute distress. He's alert and oriented x4 and appropriate throughout the examination. Cardiopulmonary assessment is negative for acute distress and he exhibits normal effort.   Lab Findings: Lab Results  Component Value Date   WBC 4.5 08/21/2019   HGB 14.1 08/21/2019   HCT 44.4 08/21/2019   MCV 86.2 08/21/2019   PLT 215 08/21/2019    Radiographic Findings:  Patient underwent CT imaging in our clinic for post implant dosimetry. The CT will be reviewed by Dr. Tammi Klippel to confirm there is an adequate distribution of radioactive seeds throughout the prostate gland and ensure that there are no seeds in or near the rectum. His scheduled for prostate MRI 09/21/19 and those images will be fused with his CT images for further evaluation. We  suspect the final radiation plan and dosimetry will show appropriate coverage of the prostate gland. He understands that we will call and inform him of any unexpected findings on further review of his imaging and dosimetry.  Impression/Plan: 68 y.o. gentleman with Stage T1c adenocarcinoma of the prostate with Gleason score of 4+3, and PSA of 5.87. The patient is recovering from the effects of radiation. His urinary symptoms should gradually improve over the next 4-6 months. We talked about this today. He is encouraged by his improvement already and is otherwise pleased with his outcome.  We also talked about long-term follow-up for prostate cancer following seed implant. He understands that ongoing PSA determinations and digital rectal exams will help perform surveillance to rule out disease recurrence. He has a follow up appointment scheduled with Dr. Lovena Neighbours or one of his APPs on 09/25/19. He understands what to expect with his PSA measures. Patient was also educated today about some of the long-term effects from radiation including a small risk for rectal bleeding and possibly erectile dysfunction. We talked about some of the general management approaches to these potential complications. However, I did encourage the patient to contact our office or return at any point if he has questions or concerns related to his previous radiation and prostate cancer.    Nicholos Johns, PA-C

## 2019-09-19 NOTE — Progress Notes (Signed)
  Radiation Oncology         [phone] ________________________________  Name: [facility] MRN: [id number]  Date: 09/19/2019  DOB: 05/10/1951  COMPLEX SIMULATION NOTE  NARRATIVE:  The patient was brought to the [facility] today following prostate seed implantation approximately one month ago.  Identity was confirmed.  All relevant records and images related to the planned course of therapy were reviewed.  Then, the patient was set-up supine.  CT images were obtained.  The CT images were loaded into the planning software.  Then the prostate and rectum were contoured.  Treatment planning then occurred.  The implanted iodine 125 seeds were identified by the physics staff for projection of radiation distribution  I have requested : 3D Simulation  I have requested a DVH of the following structures: Prostate and rectum.    ________________________________  Sheral Apley Tammi Klippel, M.D.

## 2019-09-21 ENCOUNTER — Encounter (HOSPITAL_COMMUNITY): Payer: Self-pay

## 2019-09-21 ENCOUNTER — Ambulatory Visit (HOSPITAL_COMMUNITY)
Admission: RE | Admit: 2019-09-21 | Discharge: 2019-09-21 | Disposition: A | Discharge status: Home / Self Care | Payer: BC Managed Care – PPO | Source: Ambulatory Visit | Attending: Urology | Admitting: Urology

## 2019-09-21 ENCOUNTER — Other Ambulatory Visit: Payer: Self-pay

## 2019-09-21 DIAGNOSIS — C61 Malignant neoplasm of prostate: Secondary | ICD-10-CM

## 2019-09-25 ENCOUNTER — Telehealth: Payer: Self-pay | Admitting: *Deleted

## 2019-09-25 ENCOUNTER — Telehealth: Payer: Self-pay | Admitting: Radiation Oncology

## 2019-09-25 DIAGNOSIS — C61 Malignant neoplasm of prostate: Secondary | ICD-10-CM | POA: Diagnosis not present

## 2019-09-25 NOTE — Telephone Encounter (Signed)
Received voicemail message from patient's wife, [facility], requesting a return call. Phoned Ruby back. She reports her husband went to [facility] last week to have the MRI to confirm SpaceOar placement but he was unable to complete the scan because "the equipment was too small to accommodate him." She reports the only place he has been able to successfully have an MRI is at [facility] on [address]. She understands this RN will inform the providers of this finding and phone back with directions.   Also, she reports concern that her husband isn't sleeping well. She reports he has nocturia x 0-4. She reports some nights he "wakes up at 1 and can't go back to sleep" endorsing this is unrelated to his nocturia. Suggested the following: take tamsulosin with evening meal instead of before bed, hold fluid intake 2 hours before bed and take OTC melatonin per dosage directions on bottle. She verbalized understanding.

## 2019-09-25 NOTE — Telephone Encounter (Signed)
CALLED PATIENT TO INFORM THAT MRI ISN'T NEEDED, SPOKE WITH PATIENT'S WIFE AND SHE IS AWARE OF THIS

## 2019-09-26 ENCOUNTER — Telehealth: Payer: Self-pay | Admitting: Radiation Oncology

## 2019-09-26 NOTE — Telephone Encounter (Signed)
Phoned patient's home. Spoke with wife, Bertram Millard. Explained that per [name] Bruning, PA-C the MRI could be simply cancelled since it has now been more than 72 hours since his CT SIM. Wife expressed great concern stating, "why would he need it then suddenly he wouldn't." Confirmed the patient is not have any bowel complaints. Explained the purpose of SpaceOar, how it breaks down and attempted to reassure her. She verbalized understanding of all reviewed and expressed appreciation for the call.

## 2019-10-09 ENCOUNTER — Ambulatory Visit
Admission: RE | Admit: 2019-10-09 | Discharge: 2019-10-09 | Disposition: A | Discharge status: Home / Self Care | Payer: BC Managed Care – PPO | Source: Ambulatory Visit | Attending: Radiation Oncology | Admitting: Radiation Oncology

## 2019-10-09 ENCOUNTER — Encounter: Payer: Self-pay | Admitting: Radiation Oncology

## 2019-10-09 DIAGNOSIS — C61 Malignant neoplasm of prostate: Secondary | ICD-10-CM | POA: Diagnosis not present

## 2019-10-11 ENCOUNTER — Telehealth: Payer: Self-pay | Admitting: Radiation Oncology

## 2019-10-11 NOTE — Telephone Encounter (Signed)
Received voicemail message from patient's wife, Bertram Millard, concerned about continued blood in stool and urine. Phoned Ruby back for additional details. Noted patient had a seed implant on 08/24/2019. Ruby reports the patient continues to see bright red blood in stool and urine on frequent occasions. She denies that he is having to strain to void or defecate. She reports he frequently has to void and defecate. She denies he complains of dysuria but, reports he continues to take the pyridium prescribed. Ruby understands this RN will inform the providers of these findings and phone her back with direction.

## 2019-10-11 NOTE — Telephone Encounter (Signed)
I would advise that he stop taking the Pyridium at this point to see if that is what they are mistaking for blood since it can have a similar appearance. If he continues with dysuria and/or gross hematuria once he stops the pyridium, he needs to get back in with urology for further evaluation and may need cystoscopy.  When I spoke with him for his 1 month f/u, he mentioned that he had an appointment at [facility] on 09/25/19 but I do not see a note from that visit so I am not sure if he was seen or not and/or if this was discussed at that visit. It would be unusual to have persistent hematuria and rectal bleeding, now almost 2 months out from his procedure.

## 2019-10-15 ENCOUNTER — Telehealth: Payer: Self-pay | Admitting: Radiation Oncology

## 2019-10-15 NOTE — Telephone Encounter (Signed)
Phoned patient's home. Spoke with wife, Bertram Millard. Relayed the following per [name] Bruning, PA-C:  I would advise that he stop taking the Pyridium at this point to see if that is what they are mistaking for blood since it can have a similar appearance. If he continues with dysuria and/or gross hematuria once he stops the pyridium, he needs to get back in with urology for further evaluation and may need cystoscopy.  When I spoke with him for his 1 month f/u, he mentioned that he had an appointment at [facility] on 09/25/19 but I do not see a note from that visit so I am not sure if he was seen or not and/or if this was discussed at that visit. It would be unusual to have persistent hematuria and rectal bleeding, now almost 2 months out from his procedure.  Answered all Ruby's questions to the best of my ability. Ruby verbalized understanding of all reviewed and appreciation for the call back. Ruby understands to call this RN back with further needs.

## 2019-10-25 NOTE — Progress Notes (Signed)
  Radiation Oncology         [phone]) [phone] ________________________________  Name: Juanmiguel Rojano MRN: [id number]  Date: 10/09/2019  DOB: [birth year]  3D Planning Note   Prostate Brachytherapy Post-Implant Dosimetry  Diagnosis: 68 y.o. gentleman with Stage T1c adenocarcinoma of the prostate with Gleason score of 4+3, and PSA of 5.87   Narrative: On a previous date, Keylon Wharff returned following prostate seed implantation for post implant planning. He underwent CT scan complex simulation to delineate the three-dimensional structures of the pelvis and demonstrate the radiation distribution.  Since that time, the seed localization, and complex isodose planning with dose volume histograms have now been completed.  Results:   Prostate Coverage - The dose of radiation delivered to the 90% or more of the prostate gland (D90) was 116.04% of the prescription dose. This exceeds our goal of greater than 90%. Rectal Sparing - The volume of rectal tissue receiving the prescription dose or higher was 0.02 cc. This falls under our thresholds tolerance of 1.0 cc.  Impression: The prostate seed implant appears to show adequate target coverage and appropriate rectal sparing.  Plan:  The patient will continue to follow with urology for ongoing PSA determinations. I would anticipate a high likelihood for local tumor control with minimal risk for rectal morbidity.  ________________________________  Sheral Apley Tammi Klippel, M.D.

## 2019-10-31 DIAGNOSIS — H9319 Tinnitus, unspecified ear: Secondary | ICD-10-CM | POA: Diagnosis not present

## 2019-10-31 DIAGNOSIS — H938X2 Other specified disorders of left ear: Secondary | ICD-10-CM | POA: Diagnosis not present

## 2019-10-31 DIAGNOSIS — H9113 Presbycusis, bilateral: Secondary | ICD-10-CM | POA: Diagnosis not present

## 2019-10-31 DIAGNOSIS — H9313 Tinnitus, bilateral: Secondary | ICD-10-CM | POA: Diagnosis not present

## 2019-10-31 DIAGNOSIS — H903 Sensorineural hearing loss, bilateral: Secondary | ICD-10-CM | POA: Diagnosis not present

## 2019-10-31 DIAGNOSIS — H833X3 Noise effects on inner ear, bilateral: Secondary | ICD-10-CM | POA: Diagnosis not present

## 2020-01-03 DIAGNOSIS — C61 Malignant neoplasm of prostate: Secondary | ICD-10-CM | POA: Diagnosis not present

## 2020-01-10 DIAGNOSIS — N5201 Erectile dysfunction due to arterial insufficiency: Secondary | ICD-10-CM | POA: Diagnosis not present

## 2020-01-10 DIAGNOSIS — C61 Malignant neoplasm of prostate: Secondary | ICD-10-CM | POA: Diagnosis not present

## 2020-01-10 DIAGNOSIS — R3915 Urgency of urination: Secondary | ICD-10-CM | POA: Diagnosis not present

## 2020-01-14 ENCOUNTER — Ambulatory Visit: Payer: BC Managed Care – PPO | Attending: Internal Medicine

## 2020-01-14 DIAGNOSIS — Z23 Encounter for immunization: Secondary | ICD-10-CM

## 2020-01-14 NOTE — Progress Notes (Signed)
   Covid-19 Vaccination Clinic  Name:  Elree Boening    MRN: [id number] DOB: 12-15-1950  01/14/2020  Mr. Brakeman was observed post Covid-19 immunization for 15 minutes without incident. He was provided with Vaccine Information Sheet and instruction to access the V-Safe system.   Mr. Heglund was instructed to call 911 with any severe reactions post vaccine: Marland Kitchen Difficulty breathing  . Swelling of face and throat  . A fast heartbeat  . A bad rash all over body  . Dizziness and weakness   Immunizations Administered    Name Date Dose VIS Date Route   Pfizer COVID-19 Vaccine 01/14/2020 10:16 AM 0.3 mL 10/05/2019 Intramuscular   Manufacturer: [place]   Lot: [id number]   [place]: [id number]

## 2020-02-06 ENCOUNTER — Ambulatory Visit: Payer: BC Managed Care – PPO | Attending: Internal Medicine

## 2020-02-06 DIAGNOSIS — Z23 Encounter for immunization: Secondary | ICD-10-CM

## 2020-02-06 NOTE — Progress Notes (Signed)
   Covid-19 Vaccination Clinic  Name:  Glen Freeman    MRN: [id number] DOB: Nov 28, 1950  02/06/2020  [name] was observed post Covid-19 immunization for 15 minutes without incident. He was provided with Vaccine Information Sheet and instruction to access the V-Safe system.   [name] was instructed to call 911 with any severe reactions post vaccine: Marland Kitchen Difficulty breathing  . Swelling of face and throat  . A fast heartbeat  . A bad rash all over body  . Dizziness and weakness   Immunizations Administered    Name Date Dose VIS Date Route   Pfizer COVID-19 Vaccine 02/06/2020 10:57 AM 0.3 mL 10/05/2019 Intramuscular   Manufacturer: [place]   Lot: [id number]   [place]: [id number]

## 2020-02-18 IMAGING — MR MR ABDOMEN WO/W CM MRCP
12 of 19 series · 24 of 48 positions shown · IV contrast (multihance)
Comparison: CT evaluation 05/29/2019

CLINICAL DATA: History of cystic pancreatic lesion discovered on
recent CT.

EXAM:
MRI ABDOMEN WITHOUT AND WITH CONTRAST (INCLUDING MRCP)
TECHNIQUE: Multiplanar multisequence MR imaging of the abdomen was performed
both before and after the administration of intravenous contrast.
Heavily T2-weighted images of the biliary and pancreatic ducts were
obtained, and three-dimensional MRCP images were rendered by post
processing.
Creatinine was obtained on site at [HOSPITAL] at [HOSPITAL].
Results: Creatinine 1.1 mg/dL.
CONTRAST:  20mL MULTIHANCE GADOBENATE DIMEGLUMINE 529 MG/ML IV SOLN

[Series 3: T2 · coronal · 5.0mm · 1.48mm/px · 1 of 28 slices shown (1 of 3)]
[im 1/28]
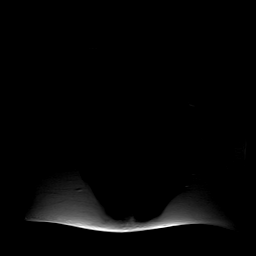

[Series 4: T2 · axial · 5.0mm · 1.48mm/px · 1 of 37 slices shown (2 of 3)]
[im 1/37]
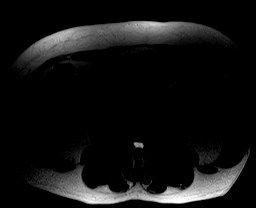

[Series 5: axial in out · axial · 5.5mm · 0.74mm/px · 1 of 68 slices shown]
[im 1/68]
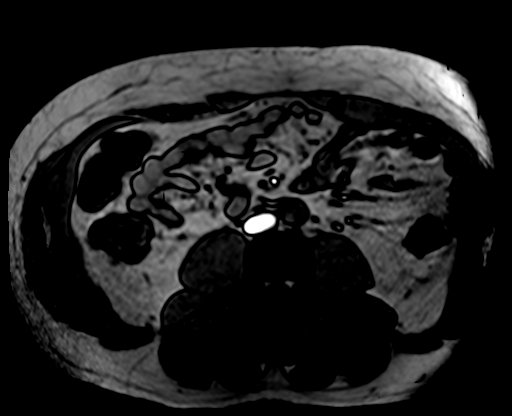

[Series 6: MRCP fat-sat · coronal · 3.0mm · 0.70mm/px · 1 of 25 slices shown]
[im 1/25]
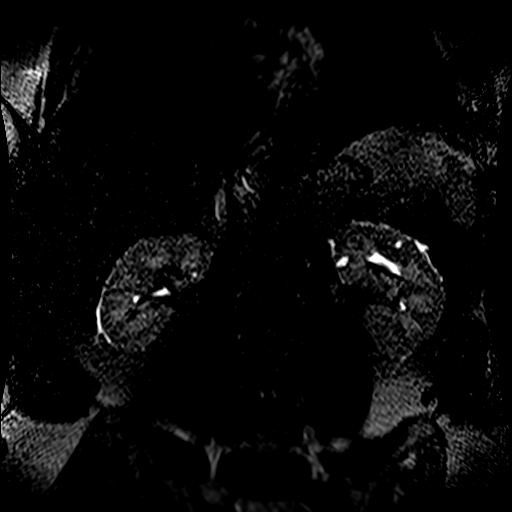

[Series 7: T2 · axial · 5.0mm · 0.74mm/px · 1 of 40 slices shown (3 of 3)]
[im 1/40]
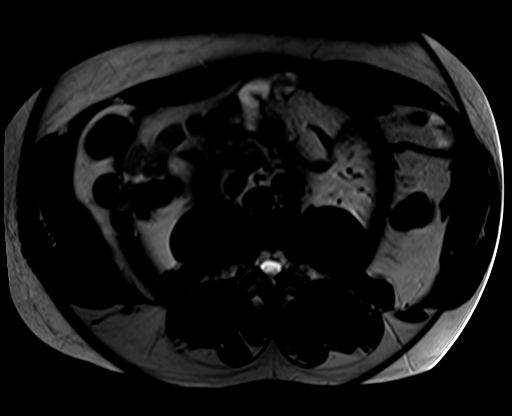

[Series 8: ep2d_diff_b50_500_800_p2_trig · axial · 5.0mm · 1.98mm/px · z∈[-69,+175]mm · 3 of 119 slices shown]
[im 1/119]
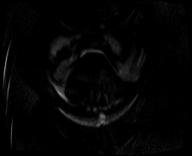
[im 60/119]
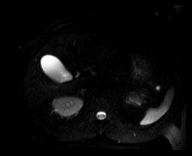
[im 119/119]
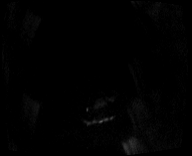

[Series 9: ep2d_[id number]_800_p2_trig_adc · axial · 5.0mm · 1.98mm/px · 1 of 40 slices shown]
[im 1/40]
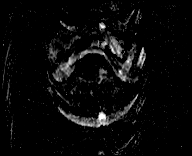

[Series 12: T1 dynamic · axial · non-contrast · 2.0mm · 0.78mm/px · z∈[-58,+164]mm · 3 of 112 slices shown]
[im 1/112]
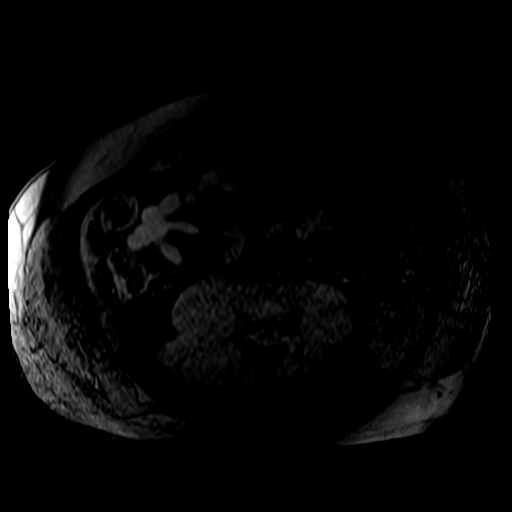
[im 56/112]
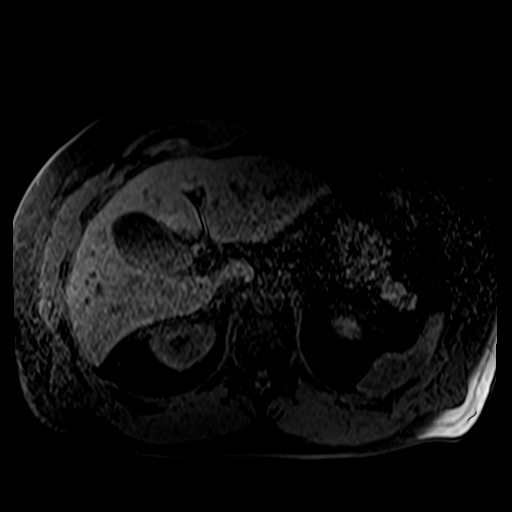
[im 112/112]
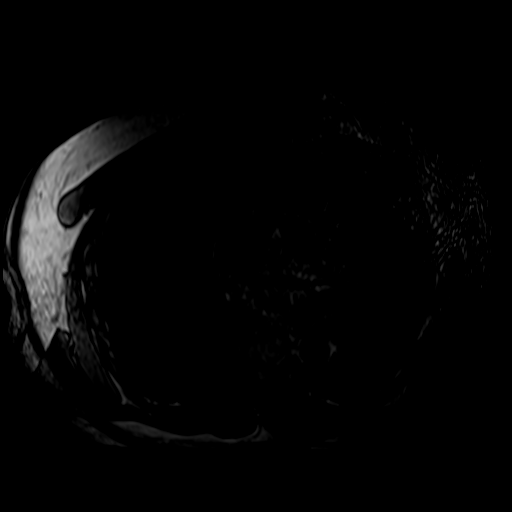

[Series 13: post 25 sec · axial · 2.0mm · 0.78mm/px · z∈[-58,+164]mm · 3 of 112 slices shown]
[im 1/112]
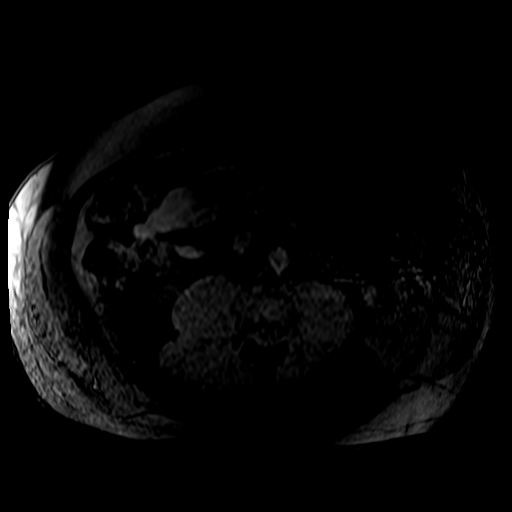
[im 56/112]
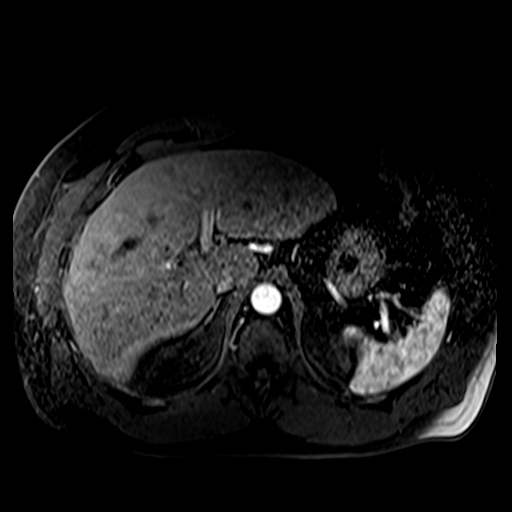
[im 112/112]
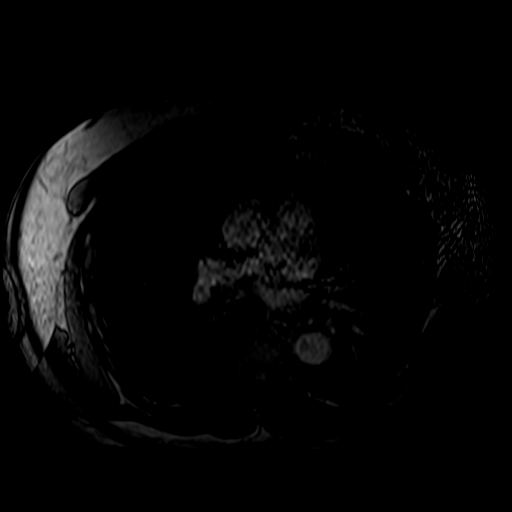

[Series 14: post 25 sec_sub · axial · 2.0mm · 0.78mm/px · z∈[-58,+164]mm · 4 of 112 slices shown]
[im 1/112]
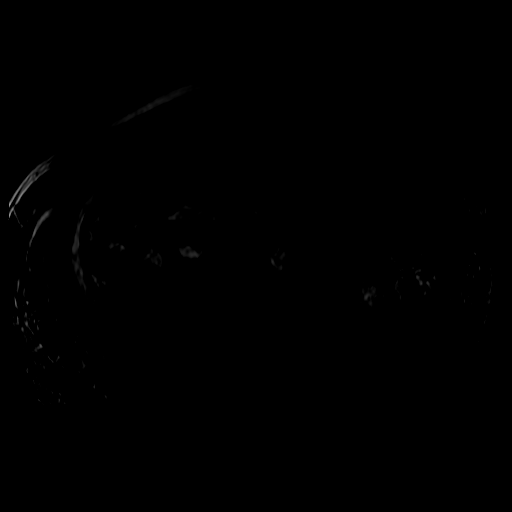
[im 38/112]
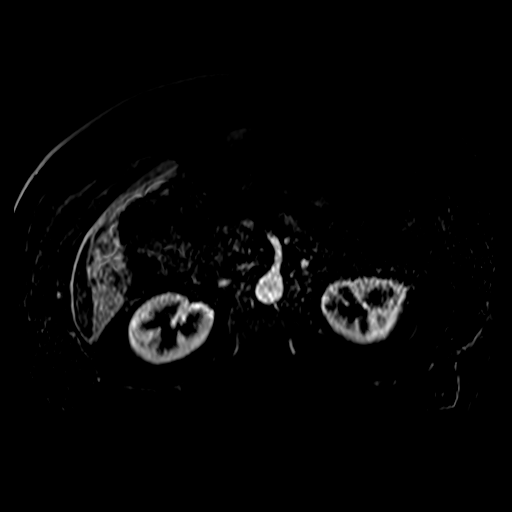
[im 75/112]
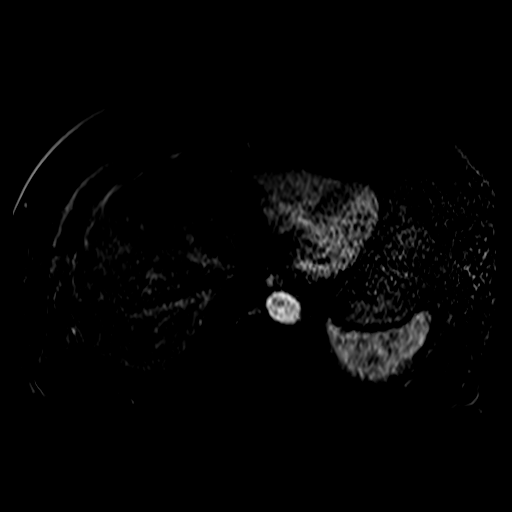
[im 112/112]
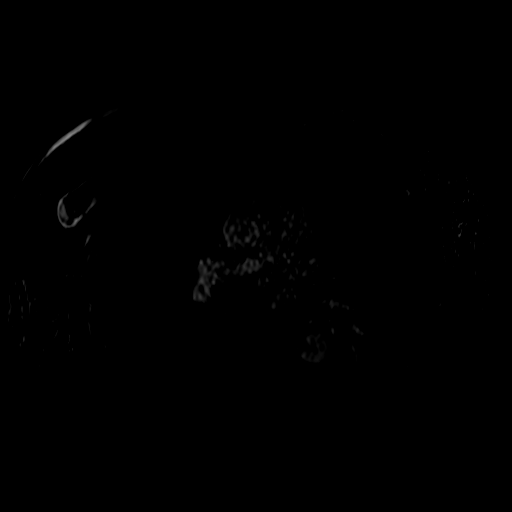

[Series 15: post 45 sec · axial · 2.0mm · 0.78mm/px · z∈[-58,+164]mm · 4 of 112 slices shown]
[im 1/112]
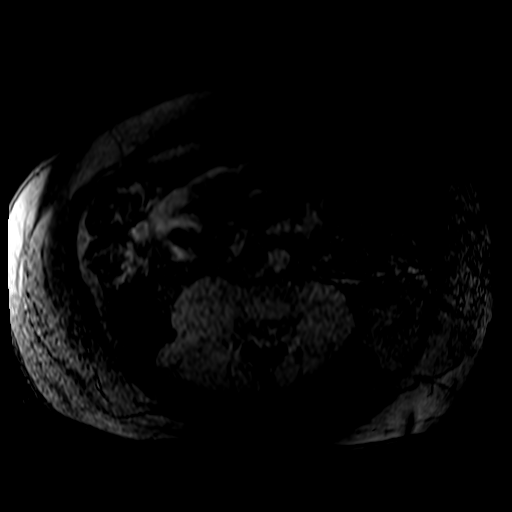
[im 38/112]
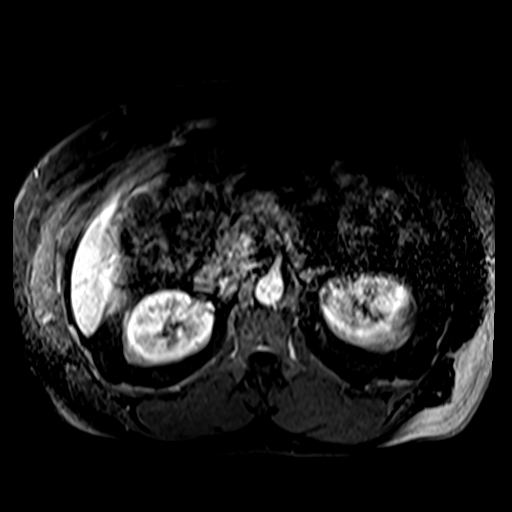
[im 75/112]
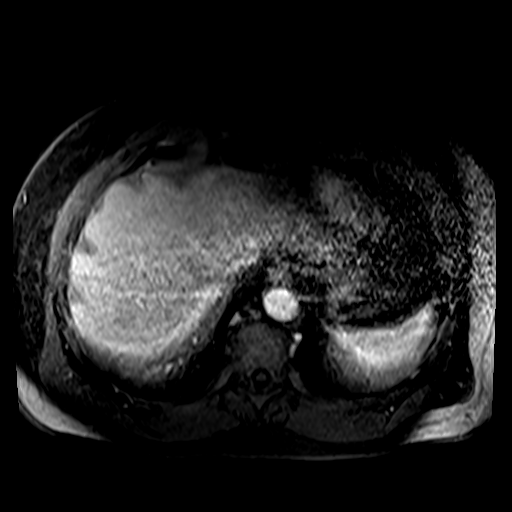
[im 112/112]
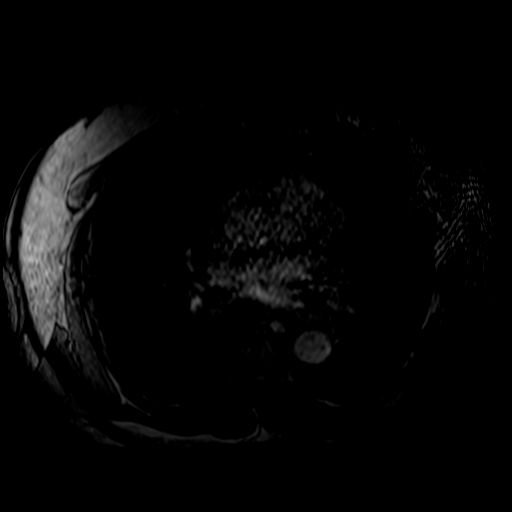

[Series 16: post 45 sec_sub · axial · 2.0mm · 0.78mm/px · 1 of 112 slices shown]
[im 1/112]
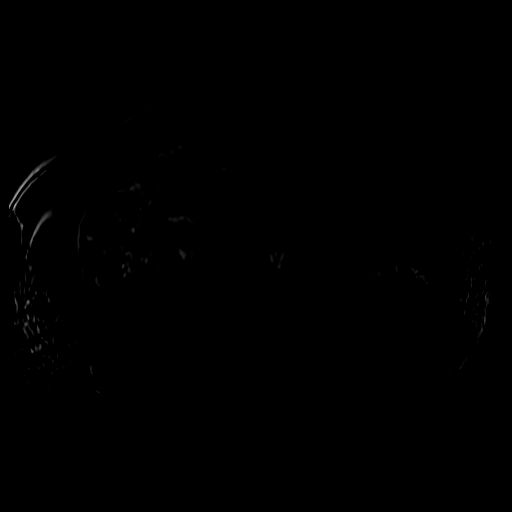

[24 of 48 positions shown; findings below may reference images not displayed]

FINDINGS: Lower chest: Assessment of lower chest is limited but unremarkable
on MR. No signs of pleural or pericardial effusion.

Hepatobiliary: No signs of focal hepatic lesion. No signs of biliary
ductal dilation.

Pancreas: Cystic lesion in the tail of the pancreas measures
approximately 11 x 9 mm. Suggestion but not definitive evidence of
ductal communication. No main duct dilation.

Spleen:  Normal no focal lesion.

Adrenals/Urinary Tract: Adrenal glands are normal. Symmetric
bilateral renal enhancement. No signs of hydronephrosis.

Stomach/Bowel: The gastrointestinal tract is normal to the extent
visualized. Study was not protocoled for bowel evaluation.

Vascular/Lymphatic: Mild presumed poststenotic dilation of the
celiac axis with configuration that suggests median arcuate ligament
impression. Patent abdominal vasculature without significant
atherosclerotic change.

Other:  No signs of ascites.

Musculoskeletal: No suspicious bone lesions identified.
IMPRESSION: Findings of cystic pancreatic lesion likely intraductal papillary
mucinous neoplasm. Suggested but not definitive evidence of
pancreatic ductal dilation on today's study. Recommend follow up pre
and post contrast MRI/MRCP or pancreatic protocol CT in 2 years.
This recommendation follows ACR consensus guidelines: Management of
Incidental Pancreatic Cysts: A White Paper of the ACR Incidental
Findings Committee. [HOSPITAL] 1641;[DATE].

Bilateral renal cysts.

## 2020-02-21 DIAGNOSIS — H25013 Cortical age-related cataract, bilateral: Secondary | ICD-10-CM | POA: Diagnosis not present

## 2020-02-21 DIAGNOSIS — H2513 Age-related nuclear cataract, bilateral: Secondary | ICD-10-CM | POA: Diagnosis not present

## 2020-02-21 DIAGNOSIS — H52203 Unspecified astigmatism, bilateral: Secondary | ICD-10-CM | POA: Diagnosis not present

## 2020-04-15 DIAGNOSIS — Z0000 Encounter for general adult medical examination without abnormal findings: Secondary | ICD-10-CM | POA: Diagnosis not present

## 2020-04-15 DIAGNOSIS — Z125 Encounter for screening for malignant neoplasm of prostate: Secondary | ICD-10-CM | POA: Diagnosis not present

## 2020-04-15 DIAGNOSIS — I10 Essential (primary) hypertension: Secondary | ICD-10-CM | POA: Diagnosis not present

## 2020-04-15 DIAGNOSIS — R7989 Other specified abnormal findings of blood chemistry: Secondary | ICD-10-CM | POA: Diagnosis not present

## 2020-04-22 DIAGNOSIS — H9313 Tinnitus, bilateral: Secondary | ICD-10-CM | POA: Diagnosis not present

## 2020-04-22 DIAGNOSIS — K219 Gastro-esophageal reflux disease without esophagitis: Secondary | ICD-10-CM | POA: Diagnosis not present

## 2020-04-22 DIAGNOSIS — C61 Malignant neoplasm of prostate: Secondary | ICD-10-CM | POA: Diagnosis not present

## 2020-04-22 DIAGNOSIS — I10 Essential (primary) hypertension: Secondary | ICD-10-CM | POA: Diagnosis not present

## 2020-04-22 DIAGNOSIS — Z0000 Encounter for general adult medical examination without abnormal findings: Secondary | ICD-10-CM | POA: Diagnosis not present

## 2020-04-22 DIAGNOSIS — Z86010 Personal history of colonic polyps: Secondary | ICD-10-CM | POA: Diagnosis not present

## 2020-04-22 DIAGNOSIS — Z1331 Encounter for screening for depression: Secondary | ICD-10-CM | POA: Diagnosis not present

## 2020-04-22 DIAGNOSIS — R82998 Other abnormal findings in urine: Secondary | ICD-10-CM | POA: Diagnosis not present

## 2020-04-22 DIAGNOSIS — N400 Enlarged prostate without lower urinary tract symptoms: Secondary | ICD-10-CM | POA: Diagnosis not present

## 2020-07-10 DIAGNOSIS — C61 Malignant neoplasm of prostate: Secondary | ICD-10-CM | POA: Diagnosis not present

## 2020-07-21 ENCOUNTER — Other Ambulatory Visit: Payer: Self-pay | Admitting: General Surgery

## 2020-07-21 DIAGNOSIS — K862 Cyst of pancreas: Secondary | ICD-10-CM

## 2020-08-14 ENCOUNTER — Other Ambulatory Visit: Payer: Self-pay

## 2020-08-14 ENCOUNTER — Ambulatory Visit
Admission: RE | Admit: 2020-08-14 | Discharge: 2020-08-14 | Disposition: A | Discharge status: Home / Self Care | Payer: BC Managed Care – PPO | Source: Ambulatory Visit | Attending: General Surgery | Admitting: General Surgery

## 2020-08-14 DIAGNOSIS — K862 Cyst of pancreas: Secondary | ICD-10-CM | POA: Diagnosis not present

## 2020-08-14 DIAGNOSIS — N281 Cyst of kidney, acquired: Secondary | ICD-10-CM | POA: Diagnosis not present

## 2020-08-14 DIAGNOSIS — K8689 Other specified diseases of pancreas: Secondary | ICD-10-CM | POA: Diagnosis not present

## 2020-08-14 DIAGNOSIS — R935 Abnormal findings on diagnostic imaging of other abdominal regions, including retroperitoneum: Secondary | ICD-10-CM | POA: Diagnosis not present

## 2020-08-14 MED ORDER — GADOBENATE DIMEGLUMINE 529 MG/ML IV SOLN
20.0000 mL | Freq: Once | INTRAVENOUS | Status: AC | PRN
  Administered 2020-08-14: 20 mL via INTRAVENOUS

## 2020-08-18 DIAGNOSIS — R059 Cough, unspecified: Secondary | ICD-10-CM | POA: Diagnosis not present

## 2020-08-18 DIAGNOSIS — K219 Gastro-esophageal reflux disease without esophagitis: Secondary | ICD-10-CM | POA: Diagnosis not present

## 2020-08-18 DIAGNOSIS — Z1152 Encounter for screening for COVID-19: Secondary | ICD-10-CM | POA: Diagnosis not present

## 2020-09-23 DIAGNOSIS — U071 COVID-19: Secondary | ICD-10-CM | POA: Diagnosis not present

## 2021-01-06 DIAGNOSIS — C61 Malignant neoplasm of prostate: Secondary | ICD-10-CM | POA: Diagnosis not present

## 2021-02-26 DIAGNOSIS — H2513 Age-related nuclear cataract, bilateral: Secondary | ICD-10-CM | POA: Diagnosis not present

## 2021-02-26 DIAGNOSIS — H52203 Unspecified astigmatism, bilateral: Secondary | ICD-10-CM | POA: Diagnosis not present

## 2021-02-26 DIAGNOSIS — H25013 Cortical age-related cataract, bilateral: Secondary | ICD-10-CM | POA: Diagnosis not present

## 2021-02-26 DIAGNOSIS — H40013 Open angle with borderline findings, low risk, bilateral: Secondary | ICD-10-CM | POA: Diagnosis not present

## 2021-05-06 DIAGNOSIS — I10 Essential (primary) hypertension: Secondary | ICD-10-CM | POA: Diagnosis not present

## 2021-05-06 DIAGNOSIS — Z125 Encounter for screening for malignant neoplasm of prostate: Secondary | ICD-10-CM | POA: Diagnosis not present

## 2021-05-13 DIAGNOSIS — Z0000 Encounter for general adult medical examination without abnormal findings: Secondary | ICD-10-CM | POA: Diagnosis not present

## 2021-05-13 DIAGNOSIS — C61 Malignant neoplasm of prostate: Secondary | ICD-10-CM | POA: Diagnosis not present

## 2021-05-13 DIAGNOSIS — K219 Gastro-esophageal reflux disease without esophagitis: Secondary | ICD-10-CM | POA: Diagnosis not present

## 2021-05-13 DIAGNOSIS — Z1331 Encounter for screening for depression: Secondary | ICD-10-CM | POA: Diagnosis not present

## 2021-05-13 DIAGNOSIS — N400 Enlarged prostate without lower urinary tract symptoms: Secondary | ICD-10-CM | POA: Diagnosis not present

## 2021-05-13 DIAGNOSIS — Z1339 Encounter for screening examination for other mental health and behavioral disorders: Secondary | ICD-10-CM | POA: Diagnosis not present

## 2021-05-13 DIAGNOSIS — R82998 Other abnormal findings in urine: Secondary | ICD-10-CM | POA: Diagnosis not present

## 2021-05-13 DIAGNOSIS — I10 Essential (primary) hypertension: Secondary | ICD-10-CM | POA: Diagnosis not present

## 2021-05-23 ENCOUNTER — Encounter: Payer: Self-pay | Admitting: Gastroenterology

## 2021-07-25 ENCOUNTER — Emergency Department (HOSPITAL_COMMUNITY)
Admission: EM | Admit: 2021-07-25 | Discharge: 2021-07-25 | Disposition: A | Discharge status: Home / Self Care | Payer: BC Managed Care – PPO | Source: Home / Self Care | Attending: Emergency Medicine | Admitting: Emergency Medicine

## 2021-07-25 ENCOUNTER — Other Ambulatory Visit: Payer: Self-pay

## 2021-07-25 ENCOUNTER — Encounter (HOSPITAL_COMMUNITY): Payer: Self-pay

## 2021-07-25 ENCOUNTER — Emergency Department (HOSPITAL_COMMUNITY): Payer: BC Managed Care – PPO

## 2021-07-25 DIAGNOSIS — Z7982 Long term (current) use of aspirin: Secondary | ICD-10-CM | POA: Insufficient documentation

## 2021-07-25 DIAGNOSIS — I10 Essential (primary) hypertension: Secondary | ICD-10-CM | POA: Insufficient documentation

## 2021-07-25 DIAGNOSIS — Z79899 Other long term (current) drug therapy: Secondary | ICD-10-CM | POA: Insufficient documentation

## 2021-07-25 DIAGNOSIS — K219 Gastro-esophageal reflux disease without esophagitis: Secondary | ICD-10-CM | POA: Insufficient documentation

## 2021-07-25 DIAGNOSIS — K5792 Diverticulitis of intestine, part unspecified, without perforation or abscess without bleeding: Secondary | ICD-10-CM | POA: Insufficient documentation

## 2021-07-25 DIAGNOSIS — Z8546 Personal history of malignant neoplasm of prostate: Secondary | ICD-10-CM | POA: Insufficient documentation

## 2021-07-25 DIAGNOSIS — R109 Unspecified abdominal pain: Secondary | ICD-10-CM | POA: Diagnosis not present

## 2021-07-25 DIAGNOSIS — R112 Nausea with vomiting, unspecified: Secondary | ICD-10-CM | POA: Diagnosis not present

## 2021-07-25 LAB — CBC WITH DIFFERENTIAL/PLATELET
Abs Immature Granulocytes: 0.02 10*3/uL (ref 0.00–0.07)
Basophils Absolute: 0 10*3/uL (ref 0.0–0.1)
Basophils Relative: 0 %
Eosinophils Absolute: 0 10*3/uL (ref 0.0–0.5)
Eosinophils Relative: 0 %
HCT: 40.6 % (ref 39.0–52.0)
Hemoglobin: 13.1 g/dL (ref 13.0–17.0)
Immature Granulocytes: 0 %
Lymphocytes Relative: 13 %
Lymphs Abs: 1.1 10*3/uL (ref 0.7–4.0)
MCH: 27.8 pg (ref 26.0–34.0)
MCHC: 32.3 g/dL (ref 30.0–36.0)
MCV: 86.2 fL (ref 80.0–100.0)
Monocytes Absolute: 0.8 10*3/uL (ref 0.1–1.0)
Monocytes Relative: 9 %
Neutro Abs: 6.4 10*3/uL (ref 1.7–7.7)
Neutrophils Relative %: 78 %
Platelets: 177 10*3/uL (ref 150–400)
RBC: 4.71 MIL/uL (ref 4.22–5.81)
RDW: 14.4 % (ref 11.5–15.5)
WBC: 8.3 10*3/uL (ref 4.0–10.5)
nRBC: 0 % (ref 0.0–0.2)

## 2021-07-25 LAB — COMPREHENSIVE METABOLIC PANEL
ALT: 23 U/L (ref 0–44)
AST: 15 U/L (ref 15–41)
Albumin: 3.5 g/dL (ref 3.5–5.0)
Alkaline Phosphatase: 56 U/L (ref 38–126)
Anion gap: 6 (ref 5–15)
BUN: 13 mg/dL (ref 8–23)
CO2: 26 mmol/L (ref 22–32)
Calcium: 8.8 mg/dL — ABNORMAL LOW (ref 8.9–10.3)
Chloride: 104 mmol/L (ref 98–111)
Creatinine, Ser: 1.04 mg/dL (ref 0.61–1.24)
GFR, Estimated: >60 mL/min (ref >60)
Glucose, Bld: 106 mg/dL — ABNORMAL HIGH (ref 70–99)
Potassium: 3.8 mmol/L (ref 3.5–5.1)
Sodium: 136 mmol/L (ref 135–145)
Total Bilirubin: 1.4 mg/dL — ABNORMAL HIGH (ref 0.3–1.2)
Total Protein: 6.2 g/dL — ABNORMAL LOW (ref 6.5–8.1)

## 2021-07-25 LAB — URINALYSIS, ROUTINE W REFLEX MICROSCOPIC
Bilirubin Urine: NEGATIVE
Glucose, UA: NEGATIVE mg/dL
Hgb urine dipstick: NEGATIVE
Ketones, ur: NEGATIVE mg/dL
Leukocytes,Ua: NEGATIVE
Nitrite: NEGATIVE
Protein, ur: NEGATIVE mg/dL
Specific Gravity, Urine: 1.016 (ref 1.005–1.030)
pH: 8 (ref 5.0–8.0)

## 2021-07-25 LAB — LIPASE, BLOOD: Lipase: 24 U/L (ref 11–51)

## 2021-07-25 MED ORDER — NAPROXEN 375 MG PO TABS: 375.0000 mg | ORAL_TABLET | Freq: Two times a day (BID) | ORAL | 0 refills | Status: DC

## 2021-07-25 MED ORDER — IOHEXOL 350 MG/ML SOLN
80.0000 mL | Freq: Once | INTRAVENOUS | Status: AC | PRN
  Administered 2021-07-25: 80 mL via INTRAVENOUS

## 2021-07-25 MED ORDER — MORPHINE SULFATE (PF) 4 MG/ML IV SOLN
4.0000 mg | Freq: Once | INTRAVENOUS | Status: AC
  Administered 2021-07-25: 4 mg via INTRAVENOUS
  Filled 2021-07-25: qty 1

## 2021-07-25 MED ORDER — HYDROCODONE-ACETAMINOPHEN 5-325 MG PO TABS: 1.0000 | ORAL_TABLET | Freq: Four times a day (QID) | ORAL | 0 refills | Status: DC | PRN

## 2021-07-25 MED ORDER — AMOXICILLIN-POT CLAVULANATE 875-125 MG PO TABS
1.0000 | ORAL_TABLET | Freq: Once | ORAL | Status: AC
  Administered 2021-07-25: 1 via ORAL
  Filled 2021-07-25: qty 1

## 2021-07-25 MED ORDER — ETOMIDATE 2 MG/ML IV SOLN
INTRAVENOUS | Status: AC
  Filled 2021-07-25: qty 20

## 2021-07-25 MED ORDER — ROCURONIUM BROMIDE 10 MG/ML (PF) SYRINGE
PREFILLED_SYRINGE | INTRAVENOUS | Status: AC
  Filled 2021-07-25: qty 10

## 2021-07-25 MED ORDER — IOHEXOL 9 MG/ML PO SOLN
ORAL | Status: AC
  Filled 2021-07-25: qty 1000

## 2021-07-25 MED ORDER — AMOXICILLIN-POT CLAVULANATE 875-125 MG PO TABS: 1.0000 | ORAL_TABLET | Freq: Two times a day (BID) | ORAL | 0 refills | Status: DC

## 2021-07-25 MED ORDER — ONDANSETRON HCL 4 MG/2ML IJ SOLN
4.0000 mg | Freq: Once | INTRAMUSCULAR | Status: AC
  Administered 2021-07-25: 4 mg via INTRAVENOUS
  Filled 2021-07-25: qty 2

## 2021-07-25 MED ORDER — IOHEXOL 9 MG/ML PO SOLN
500.0000 mL | ORAL | Status: AC
  Administered 2021-07-25: 500 mL via ORAL

## 2021-07-25 NOTE — ED Triage Notes (Signed)
Pt from home arrived POV for c/o intermittent LLQ pain since yesterday. Denies NVD, fevers, or any other symptoms. Wife reports pt had been dx with diverticulitis once.

## 2021-07-25 NOTE — Discharge Instructions (Addendum)
Take the antibiotics as prescribed.  Follow-up with your primary care doctor to make sure the symptoms resolved.  Return to the ER for trouble with fevers or increasing pain

## 2021-07-25 NOTE — ED Notes (Signed)
Patient transported to CT 

## 2021-07-25 NOTE — ED Provider Notes (Signed)
[facility] EMERGENCY DEPARTMENT Provider Note   CSN: [id number] Arrival date & time: 07/25/21  0818     History Chief Complaint  Patient presents with   Abdominal Pain    Glen Freeman is a 70 y.o. male.   Abdominal Pain  Patient presents to the ED for evaluation of abdominal pain.  Patient states the symptoms started yesterday.  It is an aching discomfort in the left lower abdomen.  He was able to work last evening and eventually by the time he went home and had resolved.  The pain however returned during the night.  It is tender to touch.  It does increase sometimes when he moves around.  He has not had any issues with his appetite.  No vomiting or diarrhea.  No constipation.  No dysuria.  Patient thinks he might of had diverticulitis once in the past.  Currently the pain is not severe and he does not need anything for pain right now.  Past Medical History:  Diagnosis Date   Allergic rhinitis    Diverticulosis    GERD (gastroesophageal reflux disease)    History of kidney stones    History of thrombophlebitis    2010  superficial lower extremity,  completed 6 months coumadin   Hypertension    followed by pcp   (08-23-2019 per has never had a stress test)   Nocturia    Prostate cancer [facility]) urologist-- dr winter/ oncologist-- dr Tammi Klippel   dx 05-24-2019--- Stage T1c,  Gleason 4+3   Venous insufficiency    Left  leg; with saphenous varicositis,     Patient Active Problem List   Diagnosis Date Noted   Malignant neoplasm of prostate ([place]) 06/19/2019   Popping of left ear 06/22/2016   KNEE PAIN, LEFT, ACUTE 05/02/2010   Essential hypertension 01/13/2010   VIRAL URI 01/13/2010   ALLERGIC RHINITIS 02/14/2009   DVT 11/15/2008   THROMBOSIS, 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 Kellie Simmering   KNEE RECONSTRUCTION Right 1970   football injury   [place] N/A 08/24/2019   Procedure: RADIOACTIVE SEED IMPLANT/BRACHYTHERAPY IMPLANT;  Surgeon: Ceasar Mons, MD;  Location: [facility];  Service: Urology;  Laterality: N/A;  only needs 90 min for all procedures   SPACE OAR INSTILLATION N/A 08/24/2019   Procedure: SPACE OAR INSTILLATION;  Surgeon: Ceasar Mons, MD;  Location[facility];  Service: Urology;  Laterality: N/A;       Family History  Problem Relation Age of Onset   Breast cancer Other        family hx - 1st degree relatvie <50   Diabetes Other        1st degree relative   Hypertension Other    Prostate cancer Other        1st degree relative <50    Colon cancer Neg Hx     Social History   Tobacco Use   Smoking status: Never   Smokeless tobacco: Never  Vaping Use   Vaping Use: Never used  Substance Use Topics   Alcohol use: Not Currently  Alcohol/week: 0.0 standard drinks    Comment: occasional   Drug use: Never    Home Medications Prior to Admission medications   Medication Sig Start Date End Date Taking? Authorizing Provider  amoxicillin-clavulanate (AUGMENTIN) 875-125 MG tablet Take 1 tablet by mouth every 12 (twelve) hours. 07/25/21  Yes Dorie Rank, MD  HYDROcodone-acetaminophen (NORCO/VICODIN) 5-325 MG tablet Take 1 tablet by mouth every 6 (six) hours as needed. 07/25/21  Yes Dorie Rank, MD  naproxen (NAPROSYN) 375 MG tablet Take 1 tablet (375 mg total) by mouth 2 (two) times daily. 07/25/21  Yes Dorie Rank, MD  albuterol (VENTOLIN HFA) 108 (90 Base) MCG/ACT inhaler Inhale into the lungs every 4 (four) hours as needed.  05/06/15   [provider]  amLODipine (NORVASC) 5 MG tablet Take 5  mg by mouth at bedtime.  04/21/19   [provider]  Ascorbic Acid (VITAMIN C) 1000 MG tablet Take 1,000 mg by mouth daily.    [provider]  aspirin 81 MG EC tablet Take 81 mg by mouth 3 (three) times a week.     [provider]  fluticasone (FLONASE) 50 MCG/ACT nasal spray Place 1 spray into both nostrils as needed. 03/08/16   [provider]  guaiFENesin (COUGH SYRUP PO) Take by mouth as needed.    [provider]  ibuprofen (ADVIL,MOTRIN) 200 MG tablet Take 200 mg by mouth every 6 (six) hours as needed.    [provider]  magnesium 30 MG tablet Take 30 mg by mouth 2 (two) times daily.    [provider]  Multiple Vitamin (MULTIVITAMIN) tablet Take 1 tablet by mouth as needed.     [provider]  omeprazole (PRILOSEC) 20 MG capsule Take 20 mg by mouth as needed.    [provider]    Allergies    Patient has no known allergies.  Review of Systems   Review of Systems  Gastrointestinal:  Positive for abdominal pain.  All other systems reviewed and are negative.  Physical Exam Updated Vital Signs BP 133/82   Pulse 78   Temp 99.4 F (37.4 C) (Oral)   Resp 17   SpO2 91%   Physical Exam Vitals and nursing note reviewed.  Constitutional:      General: He is not in acute distress.    Appearance: He is well-developed.  HENT:     Head: Normocephalic and atraumatic.     Right Ear: External ear normal.     Left Ear: External ear normal.  Eyes:     General: No scleral icterus.       Right eye: No discharge.        Left eye: No discharge.     Conjunctiva/sclera: Conjunctivae normal.  Neck:     Trachea: No tracheal deviation.  Cardiovascular:     Rate and Rhythm: Normal rate and regular rhythm.  Pulmonary:     Effort: Pulmonary effort is normal. No respiratory distress.     Breath sounds: Normal breath sounds. No stridor. No wheezing or rales.  Abdominal:     General: Bowel sounds are normal.  There is no distension.     Palpations: Abdomen is soft.     Tenderness: There is abdominal tenderness in the left lower quadrant. There is no guarding or rebound.  Musculoskeletal:        General: No tenderness or deformity.     Cervical back: Neck supple.  Skin:    General: Skin is warm and dry.  Findings: No rash.  Neurological:     General: No focal deficit present.     Mental Status: He is alert.     Cranial Nerves: No cranial nerve deficit (no facial droop, extraocular movements intact, no slurred speech).     Sensory: No sensory deficit.     Motor: No abnormal muscle tone or seizure activity.     Coordination: Coordination normal.  Psychiatric:        Mood and Affect: Mood normal.    ED Results / Procedures / Treatments   Labs (all labs ordered are listed, but only abnormal results are displayed) Labs Reviewed  COMPREHENSIVE METABOLIC PANEL - Abnormal; Notable for the following components:      Result Value   Glucose, Bld 106 (*)    Calcium 8.8 (*)    Total Protein 6.2 (*)    Total Bilirubin 1.4 (*)    All other components within normal limits  LIPASE, BLOOD  CBC WITH DIFFERENTIAL/PLATELET  URINALYSIS, ROUTINE W REFLEX MICROSCOPIC    EKG None  Radiology CT ABDOMEN PELVIS W CONTRAST  Result Date: 07/25/2021 CLINICAL DATA:  Suspected diverticulitis. LEFT LOWER QUADRANT abdominal pain. EXAM: CT ABDOMEN AND PELVIS WITH CONTRAST TECHNIQUE: Multidetector CT imaging of the abdomen and pelvis was performed using the standard protocol following bolus administration of intravenous contrast. CONTRAST:  28mL OMNIPAQUE IOHEXOL 350 MG/ML SOLN COMPARISON:  06/08/2019 FINDINGS: Lower chest: There is minimal atelectasis or scarring within the lingula and lung bases. Hepatobiliary: No focal liver abnormality is seen. No radiopaque gallstones, biliary dilatation, or pericholecystic inflammatory changes. Pancreas: In the pancreatic tail, there is a 0.9 centimeter cyst, similar in  appearance to prior studies. No new pancreatic abnormality. Spleen: Normal in size without focal abnormality. Adrenals/Urinary Tract: Adrenal glands are normal. Small peripheral renal cysts are noted. No suspicious renal mass. No intrarenal stones or hydronephrosis. The ureters are unremarkable. Stomach/Bowel: Stomach and small bowel loops are normal in appearance. The appendix is well seen and normal in appearance. There are extensive diverticula within the region of the sigmoid colon. There is associated inflammatory change in this segment, consistent with acute diverticulitis. There is no evidence for perforation. No abscess. No obstructing mass. Significant stool burden noted. Vascular/Lymphatic: No significant vascular findings are present. No enlarged abdominal or pelvic lymph nodes. Reproductive: Prostatic seeds noted. Other: Small fat containing paraumbilical hernia. Musculoskeletal: Mild degenerative changes in the lumbar spine. IMPRESSION: 1. Acute diverticulitis of the sigmoid colon. No perforation or abscess. 2. Stable appearance of 0.9 centimeter cyst in the pancreatic tail. This has been characterized with MRI. It was recommended that the patient have MRI 2 years from the last study (08/14/2020). 3. Prostatic seeds. 4. Small fat containing paraumbilical hernia. Electronically Signed   By: Nolon Nations M.D.   On: 07/25/2021 13:47    Procedures Procedures   Medications Ordered in ED Medications  amoxicillin-clavulanate (AUGMENTIN) 875-125 MG per tablet 1 tablet (has no administration in time range)  morphine 4 MG/ML injection 4 mg (4 mg Intravenous Given 07/25/21 0859)  ondansetron (ZOFRAN) injection 4 mg (4 mg Intravenous Given 07/25/21 0858)  iohexol (OMNIPAQUE) 9 MG/ML oral solution (  Contrast Given 07/25/21 1049)  iohexol (OMNIPAQUE) 9 MG/ML oral solution 500 mL (500 mLs Oral Contrast Given 07/25/21 1149)  iohexol (OMNIPAQUE) 350 MG/ML injection 80 mL (80 mLs Intravenous Contrast Given  07/25/21 1248)  morphine 4 MG/ML injection 4 mg (4 mg Intravenous Given 07/25/21 1327)    ED Course  I have reviewed the  triage vital signs and the nursing notes.  Pertinent labs & imaging results that were available during my care of the patient were reviewed by me and considered in my medical decision making (see chart for details).    MDM Rules/Calculators/A&P                           Patient presented to the ED with complaints of abdominal pain.  Patient does have history of diverticulitis.  Patient's CBC and metabolic panel unremarkable.  CT scan was performed to evaluate for the possibility of diverticulitis.  CT scan does show uncomplicated diverticulitis.  He has no signs of abscess or perforation.  Will discharge home with a course of antibiotics.  Close outpatient follow-up with primary doctor Final Clinical Impression(s) / ED Diagnoses Final diagnoses:  Diverticulitis    Rx / DC Orders ED Discharge Orders          Ordered    amoxicillin-clavulanate (AUGMENTIN) 875-125 MG tablet  Every 12 hours        07/25/21 1534    HYDROcodone-acetaminophen (NORCO/VICODIN) 5-325 MG tablet  Every 6 hours PRN        07/25/21 1534    naproxen (NAPROSYN) 375 MG tablet  2 times daily        07/25/21 1534             Dorie Rank, MD 07/25/21 1537

## 2021-07-26 ENCOUNTER — Encounter (HOSPITAL_COMMUNITY): Payer: Self-pay | Admitting: Emergency Medicine

## 2021-07-26 ENCOUNTER — Emergency Department (HOSPITAL_COMMUNITY)
Admission: EM | Admit: 2021-07-26 | Discharge: 2021-07-26 | Disposition: A | Discharge status: Home / Self Care | Payer: BC Managed Care – PPO | Source: Home / Self Care | Attending: Emergency Medicine | Admitting: Emergency Medicine

## 2021-07-26 ENCOUNTER — Other Ambulatory Visit: Payer: Self-pay

## 2021-07-26 DIAGNOSIS — R112 Nausea with vomiting, unspecified: Secondary | ICD-10-CM

## 2021-07-26 DIAGNOSIS — Z7982 Long term (current) use of aspirin: Secondary | ICD-10-CM | POA: Insufficient documentation

## 2021-07-26 DIAGNOSIS — Z79899 Other long term (current) drug therapy: Secondary | ICD-10-CM | POA: Insufficient documentation

## 2021-07-26 DIAGNOSIS — Z8546 Personal history of malignant neoplasm of prostate: Secondary | ICD-10-CM | POA: Insufficient documentation

## 2021-07-26 DIAGNOSIS — K5792 Diverticulitis of intestine, part unspecified, without perforation or abscess without bleeding: Secondary | ICD-10-CM | POA: Insufficient documentation

## 2021-07-26 DIAGNOSIS — I10 Essential (primary) hypertension: Secondary | ICD-10-CM | POA: Insufficient documentation

## 2021-07-26 LAB — BASIC METABOLIC PANEL
Anion gap: 10 (ref 5–15)
BUN: 17 mg/dL (ref 8–23)
CO2: 28 mmol/L (ref 22–32)
Calcium: 9.4 mg/dL (ref 8.9–10.3)
Chloride: 97 mmol/L — ABNORMAL LOW (ref 98–111)
Creatinine, Ser: 1.13 mg/dL (ref 0.61–1.24)
GFR, Estimated: >60 mL/min (ref >60)
Glucose, Bld: 126 mg/dL — ABNORMAL HIGH (ref 70–99)
Potassium: 4.4 mmol/L (ref 3.5–5.1)
Sodium: 135 mmol/L (ref 135–145)

## 2021-07-26 LAB — CBC WITH DIFFERENTIAL/PLATELET
Abs Immature Granulocytes: 0.05 10*3/uL (ref 0.00–0.07)
Basophils Absolute: 0 10*3/uL (ref 0.0–0.1)
Basophils Relative: 0 %
Eosinophils Absolute: 0 10*3/uL (ref 0.0–0.5)
Eosinophils Relative: 0 %
HCT: 44.4 % (ref 39.0–52.0)
Hemoglobin: 14.4 g/dL (ref 13.0–17.0)
Immature Granulocytes: 0 %
Lymphocytes Relative: 7 %
Lymphs Abs: 0.8 10*3/uL (ref 0.7–4.0)
MCH: 28 pg (ref 26.0–34.0)
MCHC: 32.4 g/dL (ref 30.0–36.0)
MCV: 86.4 fL (ref 80.0–100.0)
Monocytes Absolute: 0.8 10*3/uL (ref 0.1–1.0)
Monocytes Relative: 7 %
Neutro Abs: 10 10*3/uL — ABNORMAL HIGH (ref 1.7–7.7)
Neutrophils Relative %: 86 %
Platelets: 223 10*3/uL (ref 150–400)
RBC: 5.14 MIL/uL (ref 4.22–5.81)
RDW: 14.4 % (ref 11.5–15.5)
WBC: 11.6 10*3/uL — ABNORMAL HIGH (ref 4.0–10.5)
nRBC: 0 % (ref 0.0–0.2)

## 2021-07-26 MED ORDER — SODIUM CHLORIDE 0.9 % IV SOLN
3.0000 g | Freq: Once | INTRAVENOUS | Status: AC
  Administered 2021-07-26: 3 g via INTRAVENOUS
  Filled 2021-07-26: qty 8

## 2021-07-26 MED ORDER — LACTATED RINGERS IV BOLUS
1000.0000 mL | Freq: Once | INTRAVENOUS | Status: AC
  Administered 2021-07-26: 1000 mL via INTRAVENOUS

## 2021-07-26 MED ORDER — ONDANSETRON 4 MG PO TBDP
4.0000 mg | ORAL_TABLET | Freq: Once | ORAL | Status: AC
  Administered 2021-07-26: 4 mg via ORAL
  Filled 2021-07-26: qty 1

## 2021-07-26 MED ORDER — MIDAZOLAM HCL 2 MG/2ML IJ SOLN
2.0000 mg | Freq: Once | INTRAMUSCULAR | Status: AC
  Administered 2021-07-26: 2 mg via INTRAVENOUS
  Filled 2021-07-26: qty 2

## 2021-07-26 MED ORDER — ALUMINUM & MAGNESIUM HYDROXIDE 200-200 MG/5ML PO SUSP: 10.0000 mL | Freq: Four times a day (QID) | ORAL | 0 refills | Status: DC | PRN

## 2021-07-26 MED ORDER — SUCRALFATE 1 G PO TABS
1.0000 g | ORAL_TABLET | Freq: Once | ORAL | Status: AC
  Administered 2021-07-26: 1 g via ORAL
  Filled 2021-07-26: qty 1

## 2021-07-26 MED ORDER — DIPHENHYDRAMINE HCL 50 MG/ML IJ SOLN
25.0000 mg | Freq: Once | INTRAMUSCULAR | Status: AC
  Administered 2021-07-26: 25 mg via INTRAVENOUS
  Filled 2021-07-26: qty 1

## 2021-07-26 MED ORDER — ALUM & MAG HYDROXIDE-SIMETH 200-200-20 MG/5ML PO SUSP
30.0000 mL | Freq: Once | ORAL | Status: AC
  Administered 2021-07-26: 30 mL via ORAL
  Filled 2021-07-26: qty 30

## 2021-07-26 MED ORDER — ONDANSETRON 4 MG PO TBDP
8.0000 mg | ORAL_TABLET | Freq: Once | ORAL | Status: AC
  Administered 2021-07-26: 8 mg via ORAL
  Filled 2021-07-26: qty 2

## 2021-07-26 MED ORDER — PROMETHAZINE HCL 25 MG RE SUPP: 25.0000 mg | Freq: Four times a day (QID) | RECTAL | 0 refills | Status: DC | PRN

## 2021-07-26 NOTE — ED Notes (Signed)
Pt in bed, pt denies abd pain or nausea, states that he doesn't feel like he is going to vomit.  Pt passed PO challenge.  Pt doesn't reports some acid reflux like feelings, but still denies pain.

## 2021-07-26 NOTE — ED Notes (Signed)
Pt requesting more nausea medication.  PA notified.

## 2021-07-26 NOTE — ED Notes (Signed)
Pt vomited,  md notified 

## 2021-07-26 NOTE — Discharge Instructions (Addendum)
We saw you in the ER for nausea and emesis.  All the results in the ER are normal, labs and imaging. We are not sure what is causing your symptoms. The workup in the ER is not complete, and is limited to screening for life threatening and emergent conditions only, so please see a primary care doctor for further evaluation.  Please return to the ER if you have worsening chest pain, shortness of breath, pain radiating to your jaw, shoulder, or back, sweats or fainting. Otherwise see the Cardiologist or your primary care doctor as requested.

## 2021-07-26 NOTE — ED Notes (Signed)
Discharge instructions discussed with pt. Pt verbalized understanding. Pt stable and ambulatory. No signature pad available. 

## 2021-07-26 NOTE — ED Notes (Signed)
Py tolerated drinking soda well. No emesis. Pt refuses to eat. Dr. Kathrynn Humble notifed.

## 2021-07-26 NOTE — ED Triage Notes (Signed)
Pt seen in ED yesterday for LLQ pain.  Reports vomiting since 10pm last night.  Denies abd pain since being discharged.

## 2021-07-26 NOTE — ED Provider Notes (Signed)
Emergency Medicine Provider Triage Evaluation Note  Glen Freeman , a 70 y.o. male  was evaluated in triage.  Pt complains of vomiting since yesterday.  He was seen evaluated in the emergency department yesterday initially for abdominal pain.  Was ultimately found to have diverticulitis and discharged with pain medication and antibiotics.  Shortly after being home he has had intractable vomiting unable to keep anything down.  Abdominal pain has resolved.  No fever or chills.  Does report associated diarrhea as well.  Review of Systems  Positive:  Negative: See above.  Physical Exam  BP (!) 143/92 (BP Location: Right Arm)   Pulse 91   Temp 99.3 F (37.4 C)   Resp 19   SpO2 98%  Gen:   Awake, no distress   Resp:  Normal effort  MSK:   Moves extremities without difficulty  Other:    Medical Decision Making  Medically screening exam initiated at 10:32 AM.  Appropriate orders placed.  Glen Freeman was informed that the remainder of the evaluation will be completed by another provider, this initial triage assessment does not replace that evaluation, and the importance of remaining in the ED until their evaluation is complete.     Myna Bright [place], PA-C 07/26/21 1037    Dorie Rank, MD 07/27/21 [phone]

## 2021-07-26 NOTE — ED Provider Notes (Signed)
[facility] EMERGENCY DEPARTMENT Provider Note   CSN: [id number] Arrival date & time: 07/26/21  3557     History Chief Complaint  Patient presents with   Vomiting    Glen Freeman is a 70 y.o. male.  HPI     70 year old comes in with chief complaint of vomiting. Patient reports that he was recently diagnosed with diverticulitis.  Although his abdominal pain is better, he has been vomiting now. Patient reports that the vomiting started last night.  He has had about 3 episodes of emesis since then.  The emesis was dark.  Patient does not have any history of acid reflux, peptic ulcer disease and denies any heavy NSAID use or steroid use.  He was prescribed Naprosyn for his abdominal pain.  Additionally patient indicates that he is not on any blood thinners.  No dark stools.  BM have been normal.  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,  Gleason 4+3   Venous insufficiency    Left  leg; with saphenous varicositis,     Patient Active Problem List   Diagnosis Date Noted   Malignant neoplasm of prostate ([place]) 06/19/2019   Popping of left ear 06/22/2016   KNEE PAIN, LEFT, ACUTE 05/02/2010   Essential hypertension 01/13/2010   VIRAL URI 01/13/2010   ALLERGIC RHINITIS 02/14/2009   DVT 11/15/2008   THROMBOSIS, 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 Kellie Simmering   KNEE RECONSTRUCTION Right 1970   football injury   [place] N/A 08/24/2019   Procedure: RADIOACTIVE SEED IMPLANT/BRACHYTHERAPY IMPLANT;  Surgeon: Ceasar Mons, MD;  Location: [facility];  Service: Urology;  Laterality: N/A;  only needs 90 min for all procedures   SPACE OAR INSTILLATION N/A 08/24/2019   Procedure: SPACE OAR INSTILLATION;  Surgeon: Ceasar Mons, MD;  Location: [facility];  Service: Urology;  Laterality: N/A;       Family History  Problem Relation Age of Onset   Breast cancer Other        family hx - 1st degree relatvie <50   Diabetes Other        1st degree relative   Hypertension Other    Prostate cancer Other        1st degree relative <50    Colon cancer Neg Hx     Social History   Tobacco Use   Smoking status: Never   Smokeless tobacco: Never  Vaping Use   Vaping Use: Never used  Substance Use Topics   Alcohol use: Not Currently    Alcohol/week: 0.0 standard drinks    Comment: occasional   Drug  use: Never    Home Medications Prior to Admission medications   Medication Sig Start Date End Date Taking? Authorizing Provider  aluminum-magnesium hydroxide 200-200 MG/5ML suspension Take 10 mLs by mouth every 6 (six) hours as needed for up to 5 days for indigestion. 07/26/21 07/31/21 Yes Varney Biles, MD  promethazine (PHENERGAN) 25 MG suppository Place 1 suppository (25 mg total) rectally every 6 (six) hours as needed for nausea or vomiting. 07/26/21  Yes Piya Mesch, MD  albuterol (VENTOLIN HFA) 108 (90 Base) MCG/ACT inhaler Inhale into the lungs every 4 (four) hours as needed.  05/06/15   [provider]  amLODipine (NORVASC) 5 MG tablet Take 5 mg by mouth at bedtime.  04/21/19   [provider]   amoxicillin-clavulanate (AUGMENTIN) 875-125 MG tablet Take 1 tablet by mouth every 12 (twelve) hours. 07/25/21   Dorie Rank, MD  Ascorbic Acid (VITAMIN C) 1000 MG tablet Take 1,000 mg by mouth daily.    [provider]  aspirin 81 MG EC tablet Take 81 mg by mouth 3 (three) times a week.     [provider]  fluticasone (FLONASE) 50 MCG/ACT nasal spray Place 1 spray into both nostrils as needed. 03/08/16   [provider]  guaiFENesin (COUGH SYRUP PO) Take by mouth as needed.    [provider]  HYDROcodone-acetaminophen (NORCO/VICODIN) 5-325 MG tablet Take 1 tablet by mouth every 6 (six) hours as needed. 07/25/21   Dorie Rank, MD  ibuprofen (ADVIL,MOTRIN) 200 MG tablet Take 200 mg by mouth every 6 (six) hours as needed.    [provider]  magnesium 30 MG tablet Take 30 mg by mouth 2 (two) times daily.    [provider]  Multiple Vitamin (MULTIVITAMIN) tablet Take 1 tablet by mouth as needed.     [provider]  naproxen (NAPROSYN) 375 MG tablet Take 1 tablet (375 mg total) by mouth 2 (two) times daily. 07/25/21   Dorie Rank, MD  omeprazole (PRILOSEC) 20 MG capsule Take 20 mg by mouth as needed.    [provider]    Allergies    Patient has no known allergies.  Review of Systems   Review of Systems  Constitutional:  Positive for activity change.  Respiratory:  Negative for shortness of breath.   Cardiovascular:  Negative for chest pain.  Gastrointestinal:  Positive for nausea and vomiting. Negative for abdominal pain and blood in stool.  Hematological:  Does not bruise/bleed easily.  All other systems reviewed and are negative.  Physical Exam Updated Vital Signs BP (!) 147/85   Pulse 87   Temp 99 F (37.2 C)   Resp 18   SpO2 95%   Physical Exam Vitals and nursing note reviewed.  Constitutional:      Appearance: He is well-developed.  HENT:     Head: Atraumatic.  Cardiovascular:     Rate and Rhythm:  Normal rate.  Pulmonary:     Effort: Pulmonary effort is normal.  Abdominal:     Tenderness: There is no abdominal tenderness.  Musculoskeletal:     Cervical back: Neck supple.  Skin:    General: Skin is warm.  Neurological:     Mental Status: He is alert and oriented to person, place, and time.    ED Results / Procedures / Treatments   Labs (all labs ordered are listed, but only abnormal results are displayed) Labs Reviewed  CBC WITH DIFFERENTIAL/PLATELET - Abnormal; Notable for the following components:  Result Value   WBC 11.6 (*)    Neutro Abs 10.0 (*)    All other components within normal limits  BASIC METABOLIC PANEL - Abnormal; Notable for the following components:   Chloride 97 (*)    Glucose, Bld 126 (*)    All other components within normal limits  CBC    EKG EKG Interpretation  Date/Time:  Sunday July 26 2021 21:26:48 EDT Ventricular Rate:  80 PR Interval:  192 QRS Duration: 91 QT Interval:  359 QTC Calculation: 415 R Axis:   -26 Text Interpretation: Sinus rhythm Borderline left axis deviation No significant change since last tracing Confirmed by Dorie Rank [phone]) on 07/27/2021 7:07:12 AM  Radiology CT ABDOMEN PELVIS W CONTRAST  Result Date: 07/25/2021 CLINICAL DATA:  Suspected diverticulitis. LEFT LOWER QUADRANT abdominal pain. EXAM: CT ABDOMEN AND PELVIS WITH CONTRAST TECHNIQUE: Multidetector CT imaging of the abdomen and pelvis was performed using the standard protocol following bolus administration of intravenous contrast. CONTRAST:  69mL OMNIPAQUE IOHEXOL 350 MG/ML SOLN COMPARISON:  06/08/2019 FINDINGS: Lower chest: There is minimal atelectasis or scarring within the lingula and lung bases. Hepatobiliary: No focal liver abnormality is seen. No radiopaque gallstones, biliary dilatation, or pericholecystic inflammatory changes. Pancreas: In the pancreatic tail, there is a 0.9 centimeter cyst, similar in appearance to prior studies. No new pancreatic  abnormality. Spleen: Normal in size without focal abnormality. Adrenals/Urinary Tract: Adrenal glands are normal. Small peripheral renal cysts are noted. No suspicious renal mass. No intrarenal stones or hydronephrosis. The ureters are unremarkable. Stomach/Bowel: Stomach and small bowel loops are normal in appearance. The appendix is well seen and normal in appearance. There are extensive diverticula within the region of the sigmoid colon. There is associated inflammatory change in this segment, consistent with acute diverticulitis. There is no evidence for perforation. No abscess. No obstructing mass. Significant stool burden noted. Vascular/Lymphatic: No significant vascular findings are present. No enlarged abdominal or pelvic lymph nodes. Reproductive: Prostatic seeds noted. Other: Small fat containing paraumbilical hernia. Musculoskeletal: Mild degenerative changes in the lumbar spine. IMPRESSION: 1. Acute diverticulitis of the sigmoid colon. No perforation or abscess. 2. Stable appearance of 0.9 centimeter cyst in the pancreatic tail. This has been characterized with MRI. It was recommended that the patient have MRI 2 years from the last study (08/14/2020). 3. Prostatic seeds. 4. Small fat containing paraumbilical hernia. Electronically Signed   By: Nolon Nations M.D.   On: 07/25/2021 13:47    Procedures Procedures   Medications Ordered in ED Medications  ondansetron (ZOFRAN-ODT) disintegrating tablet 4 mg (4 mg Oral Given 07/26/21 1048)  ondansetron (ZOFRAN-ODT) disintegrating tablet 4 mg (4 mg Oral Given 07/26/21 1505)  lactated ringers bolus 1,000 mL (0 mLs Intravenous Stopped 07/26/21 1922)  midazolam (VERSED) injection 2 mg (2 mg Intravenous Given 07/26/21 1831)  sucralfate (CARAFATE) tablet 1 g (1 g Oral Given 07/26/21 1821)  diphenhydrAMINE (BENADRYL) injection 25 mg (25 mg Intravenous Given 07/26/21 1829)  Ampicillin-Sulbactam (UNASYN) 3 g in sodium chloride 0.9 % 100 mL IVPB (0 g  Intravenous Stopped 07/26/21 2000)  ondansetron (ZOFRAN-ODT) disintegrating tablet 8 mg (8 mg Oral Given 07/26/21 1819)  alum & mag hydroxide-simeth (MAALOX/MYLANTA) 200-200-20 MG/5ML suspension 30 mL (30 mLs Oral Given 07/26/21 2123)    ED Course  I have reviewed the triage vital signs and the nursing notes.  Pertinent labs & imaging results that were available during my care of the patient were reviewed by me and considered in my medical decision making (see chart  for details).    MDM Rules/Calculators/A&P                           70 year old male comes in with chief complaint of nausea and vomiting.  He was recently diagnosed with diverticulitis and started on Augmentin.  Clinically, it appears that his symptoms could be because of the side effects of the medicine.  He has no abdominal pain and no diarrhea.  Patient reports dark stools but he is hemodynamically stable and his hemoglobin is also within normal limits with no rise in BUN.  No major risk factors for PUD.  Plan is to initiate p.o. challenge.  I was told that patient's p.o. challenge was started in the triage and he had failed.  We will continue to work on oral challenge for now.  We will give him Versed and other agents to control his emesis.  No history of gastroparesis or cyclic vomiting syndrome.  No chest pain or shortness of breath.  Reassessment: Patient reassessed and reports that he continues to be pain-free.  The nausea has now resolved.  However he is started belching.  We will give him some Maalox.  I have ordered an EKG at this time to ensure there is no cardiac event.  Patient continues to have no chest pain or shortness of breath  Reassessment: Continues to have no vomiting, continues to have no nausea.  Oral challenge has passed.  Maalox given and feels better with gas.  Will discharge with strict ER return precautions.  Final Clinical Impression(s) / ED Diagnoses Final diagnoses:  Nausea and vomiting,  unspecified vomiting type  Diverticulitis    Rx / DC Orders ED Discharge Orders          Ordered    aluminum-magnesium hydroxide 200-200 MG/5ML suspension  Every 6 hours PRN        07/26/21 2120    promethazine (PHENERGAN) 25 MG suppository  Every 6 hours PRN        07/26/21 2120             Varney Biles, MD 07/27/21 1036

## 2021-07-27 ENCOUNTER — Encounter (HOSPITAL_COMMUNITY): Payer: Self-pay

## 2021-07-27 ENCOUNTER — Inpatient Hospital Stay (HOSPITAL_COMMUNITY)
Admission: EM | Admit: 2021-07-27 | Discharge: 2021-08-04 | DRG: 389 | Disposition: A | Discharge status: Home / Self Care | Payer: BC Managed Care – PPO | Attending: Internal Medicine | Admitting: Internal Medicine

## 2021-07-27 DIAGNOSIS — I872 Venous insufficiency (chronic) (peripheral): Secondary | ICD-10-CM | POA: Diagnosis present

## 2021-07-27 DIAGNOSIS — J309 Allergic rhinitis, unspecified: Secondary | ICD-10-CM | POA: Diagnosis present

## 2021-07-27 DIAGNOSIS — Z20822 Contact with and (suspected) exposure to covid-19: Secondary | ICD-10-CM | POA: Diagnosis present

## 2021-07-27 DIAGNOSIS — Z8672 Personal history of thrombophlebitis: Secondary | ICD-10-CM

## 2021-07-27 DIAGNOSIS — K5792 Diverticulitis of intestine, part unspecified, without perforation or abscess without bleeding: Secondary | ICD-10-CM

## 2021-07-27 DIAGNOSIS — K56600 Partial intestinal obstruction, unspecified as to cause: Principal | ICD-10-CM | POA: Diagnosis present

## 2021-07-27 DIAGNOSIS — I10 Essential (primary) hypertension: Secondary | ICD-10-CM | POA: Diagnosis present

## 2021-07-27 DIAGNOSIS — K429 Umbilical hernia without obstruction or gangrene: Secondary | ICD-10-CM | POA: Diagnosis present

## 2021-07-27 DIAGNOSIS — K56609 Unspecified intestinal obstruction, unspecified as to partial versus complete obstruction: Secondary | ICD-10-CM

## 2021-07-27 DIAGNOSIS — K5732 Diverticulitis of large intestine without perforation or abscess without bleeding: Secondary | ICD-10-CM | POA: Diagnosis present

## 2021-07-27 DIAGNOSIS — Z8042 Family history of malignant neoplasm of prostate: Secondary | ICD-10-CM

## 2021-07-27 DIAGNOSIS — Z86718 Personal history of other venous thrombosis and embolism: Secondary | ICD-10-CM

## 2021-07-27 DIAGNOSIS — K219 Gastro-esophageal reflux disease without esophagitis: Secondary | ICD-10-CM | POA: Diagnosis present

## 2021-07-27 DIAGNOSIS — Z4659 Encounter for fitting and adjustment of other gastrointestinal appliance and device: Secondary | ICD-10-CM

## 2021-07-27 DIAGNOSIS — Z7982 Long term (current) use of aspirin: Secondary | ICD-10-CM

## 2021-07-27 DIAGNOSIS — Z791 Long term (current) use of non-steroidal anti-inflammatories (NSAID): Secondary | ICD-10-CM

## 2021-07-27 DIAGNOSIS — Z79899 Other long term (current) drug therapy: Secondary | ICD-10-CM

## 2021-07-27 DIAGNOSIS — Z8546 Personal history of malignant neoplasm of prostate: Secondary | ICD-10-CM

## 2021-07-27 LAB — CBC WITH DIFFERENTIAL/PLATELET
Abs Immature Granulocytes: 0.08 10*3/uL — ABNORMAL HIGH (ref 0.00–0.07)
Basophils Absolute: 0 10*3/uL (ref 0.0–0.1)
Basophils Relative: 0 %
Eosinophils Absolute: 0.1 10*3/uL (ref 0.0–0.5)
Eosinophils Relative: 1 %
HCT: 45.1 % (ref 39.0–52.0)
Hemoglobin: 14.6 g/dL (ref 13.0–17.0)
Immature Granulocytes: 1 %
Lymphocytes Relative: 9 %
Lymphs Abs: 0.9 10*3/uL (ref 0.7–4.0)
MCH: 28.1 pg (ref 26.0–34.0)
MCHC: 32.4 g/dL (ref 30.0–36.0)
MCV: 86.9 fL (ref 80.0–100.0)
Monocytes Absolute: 0.9 10*3/uL (ref 0.1–1.0)
Monocytes Relative: 9 %
Neutro Abs: 8.7 10*3/uL — ABNORMAL HIGH (ref 1.7–7.7)
Neutrophils Relative %: 80 %
Platelets: 236 10*3/uL (ref 150–400)
RBC: 5.19 MIL/uL (ref 4.22–5.81)
RDW: 14.3 % (ref 11.5–15.5)
WBC: 10.7 10*3/uL — ABNORMAL HIGH (ref 4.0–10.5)
nRBC: 0 % (ref 0.0–0.2)

## 2021-07-27 LAB — COMPREHENSIVE METABOLIC PANEL
ALT: 19 U/L (ref 0–44)
AST: 16 U/L (ref 15–41)
Albumin: 4.1 g/dL (ref 3.5–5.0)
Alkaline Phosphatase: 51 U/L (ref 38–126)
Anion gap: 10 (ref 5–15)
BUN: 24 mg/dL — ABNORMAL HIGH (ref 8–23)
CO2: 30 mmol/L (ref 22–32)
Calcium: 9.5 mg/dL (ref 8.9–10.3)
Chloride: 95 mmol/L — ABNORMAL LOW (ref 98–111)
Creatinine, Ser: 1.12 mg/dL (ref 0.61–1.24)
GFR, Estimated: >60 mL/min (ref >60)
Glucose, Bld: 126 mg/dL — ABNORMAL HIGH (ref 70–99)
Potassium: 4.6 mmol/L (ref 3.5–5.1)
Sodium: 135 mmol/L (ref 135–145)
Total Bilirubin: 0.9 mg/dL (ref 0.3–1.2)
Total Protein: 7.6 g/dL (ref 6.5–8.1)

## 2021-07-27 LAB — URINALYSIS, ROUTINE W REFLEX MICROSCOPIC
Bacteria, UA: NONE SEEN
Bilirubin Urine: NEGATIVE
Glucose, UA: NEGATIVE mg/dL
Hgb urine dipstick: NEGATIVE
Ketones, ur: NEGATIVE mg/dL
Leukocytes,Ua: NEGATIVE
Nitrite: NEGATIVE
Protein, ur: 100 mg/dL — AB
Specific Gravity, Urine: 1.03 (ref 1.005–1.030)
pH: 5 (ref 5.0–8.0)

## 2021-07-27 MED ORDER — METRONIDAZOLE 500 MG/100ML IV SOLN
500.0000 mg | Freq: Once | INTRAVENOUS | Status: AC
  Administered 2021-07-28: 500 mg via INTRAVENOUS
  Filled 2021-07-27: qty 100

## 2021-07-27 MED ORDER — SODIUM CHLORIDE 0.9 % IV BOLUS
1000.0000 mL | Freq: Once | INTRAVENOUS | Status: AC
  Administered 2021-07-28: 1000 mL via INTRAVENOUS

## 2021-07-27 MED ORDER — SODIUM CHLORIDE 0.9 % IV SOLN
2.0000 g | Freq: Once | INTRAVENOUS | Status: AC
  Administered 2021-07-28: 2 g via INTRAVENOUS
  Filled 2021-07-27: qty 20

## 2021-07-27 MED ORDER — ONDANSETRON HCL 4 MG/2ML IJ SOLN
4.0000 mg | Freq: Once | INTRAMUSCULAR | Status: AC
  Administered 2021-07-28: 4 mg via INTRAVENOUS
  Filled 2021-07-27: qty 2

## 2021-07-27 NOTE — ED Triage Notes (Signed)
Pt arrived via POV, c/o n/v and generalized weakness. Was seen at [facility] for same, dx with diverticulitis. Pt states worsening at home.

## 2021-07-27 NOTE — ED Provider Notes (Signed)
Emergency Medicine Provider Triage Evaluation Note  Glen Freeman , a 70 y.o. male  was evaluated in triage.  Pt complains of persistent nausea and vomiting and generalized weakness.  Recently diagnosed with diverticulitis sent home on antibiotics pain and nausea medication.  Patient has been seen in the ED a second time due to continued vomiting and despite ODT Zofran at home he has not been able to keep down medications including his antibiotics.  Still has some left lower quadrant pain but this does seem to be improving.  Review of Systems  Positive: Nausea, vomiting, abdominal pain Negative: Chest pain  Physical Exam  BP (!) 148/94 (BP Location: Left Arm)   Pulse 88   Temp 98.8 F (37.1 C) (Oral)   Resp 16   SpO2 97%  Gen:   Awake, no distress   Resp:  Normal effort  MSK:   Moves extremities without difficulty  Other:  Mild left lower quadrant tenderness  Medical Decision Making  Medically screening exam initiated at 12:25 PM.  Appropriate orders placed.  Glen Freeman was informed that the remainder of the evaluation will be completed by another provider, this initial triage assessment does not replace that evaluation, and the importance of remaining in the ED until their evaluation is complete.     Jacqlyn Larsen, PA-C 07/27/21 1229    Lacretia Leigh, MD 07/29/21 [phone]

## 2021-07-27 NOTE — ED Notes (Signed)
Pt states he is unable to give a urine sample at this time, but he will let me know when he can give a sample

## 2021-07-27 NOTE — ED Provider Notes (Signed)
[facility] DEPT Provider Note   CSN: [id number] Arrival date & time: 07/27/21  6301     History Chief Complaint  Patient presents with   Abdominal Pain   Emesis    Briar Witherspoon is a 70 y.o. male.  The history is provided by the patient and medical records.  Abdominal Pain Associated symptoms: vomiting   Emesis Associated symptoms: abdominal pain   Octavia Mottola is a 70 y.o. male who presents to the Emergency Department complaining of vomiting.  He presents to the ED for evaluation of N/V that started on Saturday night.  Had about 5 episodes of nonbloody emesis.    No fever.  No diarrhea.   Able to pass flatus. Last BM today.  No abdominal pain.  Urinating without difficulty.  This is his third ED visit.  On first visit had CT scan and labs performed and was dx with acute uncomplicated diverticulitis. On his initial visit he had abdominal pain. He was prescribed augmentin but has been unable to tolerate them due to persistent vomiting.  He was then seen 10/2 for vomiting (pain had resolved).  He was treated with IVF, antiemetics and felt better.  After discharge he developed recurrent vomiting.  He was unable to fill the prescription for phenergan. No prior similar symptoms. No prior abdominal surgeries.    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,  Gleason 4+3   Venous insufficiency    Left  leg; with saphenous varicositis,     Patient Active Problem List   Diagnosis Date Noted   Malignant neoplasm of prostate ([place]) 06/19/2019   Popping of left ear  06/22/2016   KNEE PAIN, LEFT, ACUTE 05/02/2010   Essential hypertension 01/13/2010   VIRAL URI 01/13/2010   ALLERGIC RHINITIS 02/14/2009   DVT 11/15/2008   THROMBOSIS, 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 Kellie Simmering   KNEE RECONSTRUCTION Right 1970   football injury   [place] N/A 08/24/2019   Procedure: RADIOACTIVE SEED IMPLANT/BRACHYTHERAPY IMPLANT;  Surgeon: Ceasar Mons, MD;  Location: [facility];  Service: Urology;  Laterality: N/A;  only needs 90 min for all procedures   SPACE OAR INSTILLATION N/A 08/24/2019   Procedure: SPACE OAR INSTILLATION;  Surgeon: Ceasar Mons, MD;  Location: [facility];  Service: Urology;  Laterality: N/A;       Family History  Problem Relation Age of Onset   Breast cancer Other        family hx - 1st degree relatvie <50   Diabetes Other        1st degree relative   Hypertension Other  Prostate cancer Other        1st degree relative <50    Colon cancer Neg Hx     Social History   Tobacco Use   Smoking status: Never   Smokeless tobacco: Never  Vaping Use   Vaping Use: Never used  Substance Use Topics   Alcohol use: Not Currently    Alcohol/week: 0.0 standard drinks    Comment: occasional   Drug use: Never    Home Medications Prior to Admission medications   Medication Sig Start Date End Date Taking? Authorizing Provider  albuterol (VENTOLIN HFA) 108 (90 Base) MCG/ACT inhaler Inhale into the lungs every 4 (four) hours as needed.  05/06/15   [provider]  aluminum-magnesium hydroxide 200-200  MG/5ML suspension Take 10 mLs by mouth every 6 (six) hours as needed for up to 5 days for indigestion. 07/26/21 07/31/21  Varney Biles, MD  amLODipine (NORVASC) 5 MG tablet Take 5 mg by mouth at bedtime.  04/21/19   [provider]  amoxicillin-clavulanate (AUGMENTIN) 875-125 MG tablet Take 1 tablet by mouth every 12 (twelve) hours. 07/25/21   Dorie Rank, MD  Ascorbic Acid (VITAMIN C) 1000 MG tablet Take 1,000 mg by mouth daily.    [provider]  aspirin 81 MG EC tablet Take 81 mg by mouth 3 (three) times a week.     [provider]  fluticasone (FLONASE) 50 MCG/ACT nasal spray Place 1 spray into both nostrils as needed. 03/08/16   [provider]  guaiFENesin (COUGH SYRUP PO) Take by mouth as needed.    [provider]  HYDROcodone-acetaminophen (NORCO/VICODIN) 5-325 MG tablet Take 1 tablet by mouth every 6 (six) hours as needed. 07/25/21   Dorie Rank, MD  ibuprofen (ADVIL,MOTRIN) 200 MG tablet Take 200 mg by mouth every 6 (six) hours as needed.    [provider]  magnesium 30 MG tablet Take 30 mg by mouth 2 (two) times daily.    [provider]  Multiple Vitamin (MULTIVITAMIN) tablet Take 1 tablet by mouth as needed.     [provider]  naproxen (NAPROSYN) 375 MG tablet Take 1 tablet (375 mg total) by mouth 2 (two) times daily. 07/25/21   Dorie Rank, MD  omeprazole (PRILOSEC) 20 MG capsule Take 20 mg by mouth as needed.    [provider]  promethazine (PHENERGAN) 25 MG suppository Place 1 suppository (25 mg total) rectally every 6 (six) hours as needed for nausea or vomiting. 07/26/21   Varney Biles, MD    Allergies    Patient has no known allergies.  Review of Systems   Review of Systems  Gastrointestinal:  Positive for abdominal pain and vomiting.  All other systems reviewed and are negative.  Physical Exam Updated Vital Signs BP (!) 163/92   Pulse 75   Temp 99.6 F (37.6 C) (Oral)   Resp 18   SpO2  95%   Physical Exam Vitals and nursing note reviewed.  Constitutional:      Appearance: He is well-developed.  HENT:     Head: Normocephalic and atraumatic.  Cardiovascular:     Rate and Rhythm: Normal rate and regular rhythm.  Pulmonary:     Effort: Pulmonary effort is normal.     Breath sounds: Normal breath sounds.  Abdominal:     Palpations: Abdomen is soft.     Tenderness: There is no abdominal tenderness. There is no guarding or rebound.     Comments: Mild distension  Musculoskeletal:  General: No tenderness.  Skin:    General: Skin is warm and dry.  Neurological:     Mental Status: He is alert and oriented to person, place, and time.  Psychiatric:        Behavior: Behavior normal.    ED Results / Procedures / Treatments   Labs (all labs ordered are listed, but only abnormal results are displayed) Labs Reviewed  COMPREHENSIVE METABOLIC PANEL - Abnormal; Notable for the following components:      Result Value   Chloride 95 (*)    Glucose, Bld 126 (*)    BUN 24 (*)    All other components within normal limits  CBC WITH DIFFERENTIAL/PLATELET - Abnormal; Notable for the following components:   WBC 10.7 (*)    Neutro Abs 8.7 (*)    Abs Immature Granulocytes 0.08 (*)    All other components within normal limits  URINALYSIS, ROUTINE W REFLEX MICROSCOPIC - Abnormal; Notable for the following components:   APPearance HAZY (*)    Protein, ur 100 (*)    All other components within normal limits  RESP PANEL BY RT-PCR (FLU A&B, COVID) ARPGX2    EKG None  Radiology CT Abdomen Pelvis W Contrast  Result Date: 07/28/2021 CLINICAL DATA:  Nausea, vomiting and weakness. EXAM: CT ABDOMEN AND PELVIS WITH CONTRAST TECHNIQUE: Multidetector CT imaging of the abdomen and pelvis was performed using the standard protocol following bolus administration of intravenous contrast. CONTRAST:  57mL OMNIPAQUE IOHEXOL 350 MG/ML SOLN COMPARISON:  July 25, 2021 FINDINGS: Lower chest:  Mild to moderate severity areas of atelectasis are noted within the posterior aspect of the bilateral lung bases. Hepatobiliary: No focal liver abnormality is seen. No gallstones, gallbladder wall thickening, or biliary dilatation. Heterogeneous increased attenuation is seen within the dependent portion of the gallbladder lumen. Pancreas: Unremarkable. No pancreatic ductal dilatation or surrounding inflammatory changes. Spleen: Normal in size without focal abnormality. Adrenals/Urinary Tract: Adrenal glands are unremarkable. Kidneys are normal, without renal calculi, focal lesion, or hydronephrosis. Bladder is unremarkable. Stomach/Bowel: Stomach is within normal limits. Numerous dilated small bowel loops are seen within the abdomen and pelvis. A transition zone is noted within the left lower quadrant/left hemipelvis (axial CT images 55 through 62, CT series 2). This is along the expected region of the proximal ileum. Noninflamed diverticula are noted within the descending and sigmoid colon. Vascular/Lymphatic: No significant vascular findings are present. No enlarged abdominal or pelvic lymph nodes. 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 Other: A very small fat containing umbilical hernia is noted. No abdominopelvic ascites. Musculoskeletal: Degenerative changes are seen within the lumbar spine. IMPRESSION: 1. Small-bowel obstruction with a transition zone noted within the left lower quadrant/left hemipelvis. 2. Colonic diverticulosis. 3. Heterogeneous increased attenuation within the gallbladder lumen which may represent retained contrast versus gallbladder sludge. Electronically Signed   By: Virgina Norfolk M.D.   On: 07/28/2021 01:16   DG Abd Acute W/Chest  Result Date: 07/28/2021 CLINICAL DATA:  Nausea and vomiting with generalized weakness. EXAM: DG ABDOMEN ACUTE WITH 1 VIEW CHEST COMPARISON:  None. FINDINGS: Multiple dilated small bowel loops are seen throughout the  abdomen. There is no evidence of free air. Radiopaque contrast is seen throughout portions of the large bowel which is normal in caliber. No radiopaque calculi are seen. Heart size and mediastinal contours are within normal limits. A trace amount of atelectasis is seen within the left lung base. IMPRESSION: 1. Findings consistent with a partial small bowel obstruction versus ileus. 2.  Trace amount of left basilar atelectasis. Electronically Signed   By: Virgina Norfolk M.D.   On: 07/28/2021 00:24    Procedures Procedures   Medications Ordered in ED Medications  sodium chloride (PF) 0.9 % injection (has no administration in time range)  morphine 4 MG/ML injection 4 mg (has no administration in time range)  sodium chloride 0.9 % bolus 1,000 mL (1,000 mLs Intravenous New Bag/Given 07/28/21 0019)  ondansetron (ZOFRAN) injection 4 mg (4 mg Intravenous Given 07/28/21 0020)  cefTRIAXone (ROCEPHIN) 2 g in sodium chloride 0.9 % 100 mL IVPB (2 g Intravenous New Bag/Given 07/28/21 0021)    And  metroNIDAZOLE (FLAGYL) IVPB 500 mg (500 mg Intravenous New Bag/Given 07/28/21 0022)  iohexol (OMNIPAQUE) 350 MG/ML injection 80 mL (80 mLs Intravenous Contrast Given 07/28/21 0057)    ED Course  I have reviewed the triage vital signs and the nursing notes.  Pertinent labs & imaging results that were available during my care of the patient were reviewed by me and considered in my medical decision making (see chart for details).    MDM Rules/Calculators/A&P                          patient with recent diagnosis of diverticulitis here for evaluation of persistent vomiting for the last 48 hours. He has no tenderness on examination. He does have distention. Labs are similar when compared to priors. Acute abdominal series is concerning for obstruction. CT abdomen pelvis was obtained, which confirms small bowel obstruction with transition point in the left lower quadrant. Patient does not have current vomiting but did  develop pain during his ED stay. Plan to admit for ongoing treatment for bowel obstruction. Medicine consulted for admission.  Final Clinical Impression(s) / ED Diagnoses Final diagnoses:  SBO (small bowel obstruction) ([place])    Rx / DC Orders ED Discharge Orders     None        Quintella Reichert, MD 07/28/21 [phone]

## 2021-07-28 ENCOUNTER — Emergency Department (HOSPITAL_COMMUNITY): Payer: BC Managed Care – PPO

## 2021-07-28 ENCOUNTER — Encounter (HOSPITAL_COMMUNITY): Payer: Self-pay

## 2021-07-28 ENCOUNTER — Inpatient Hospital Stay (HOSPITAL_COMMUNITY): Payer: BC Managed Care – PPO

## 2021-07-28 ENCOUNTER — Other Ambulatory Visit: Payer: Self-pay

## 2021-07-28 DIAGNOSIS — Z20822 Contact with and (suspected) exposure to covid-19: Secondary | ICD-10-CM | POA: Diagnosis present

## 2021-07-28 DIAGNOSIS — Z8672 Personal history of thrombophlebitis: Secondary | ICD-10-CM | POA: Diagnosis not present

## 2021-07-28 DIAGNOSIS — I10 Essential (primary) hypertension: Secondary | ICD-10-CM

## 2021-07-28 DIAGNOSIS — K56609 Unspecified intestinal obstruction, unspecified as to partial versus complete obstruction: Secondary | ICD-10-CM | POA: Diagnosis not present

## 2021-07-28 DIAGNOSIS — K429 Umbilical hernia without obstruction or gangrene: Secondary | ICD-10-CM | POA: Diagnosis present

## 2021-07-28 DIAGNOSIS — I872 Venous insufficiency (chronic) (peripheral): Secondary | ICD-10-CM | POA: Diagnosis present

## 2021-07-28 DIAGNOSIS — Z4682 Encounter for fitting and adjustment of non-vascular catheter: Secondary | ICD-10-CM | POA: Diagnosis not present

## 2021-07-28 DIAGNOSIS — J9811 Atelectasis: Secondary | ICD-10-CM | POA: Diagnosis not present

## 2021-07-28 DIAGNOSIS — K219 Gastro-esophageal reflux disease without esophagitis: Secondary | ICD-10-CM | POA: Diagnosis present

## 2021-07-28 DIAGNOSIS — J309 Allergic rhinitis, unspecified: Secondary | ICD-10-CM | POA: Diagnosis present

## 2021-07-28 DIAGNOSIS — Z79899 Other long term (current) drug therapy: Secondary | ICD-10-CM | POA: Diagnosis not present

## 2021-07-28 DIAGNOSIS — K5792 Diverticulitis of intestine, part unspecified, without perforation or abscess without bleeding: Secondary | ICD-10-CM

## 2021-07-28 DIAGNOSIS — Z8546 Personal history of malignant neoplasm of prostate: Secondary | ICD-10-CM | POA: Diagnosis not present

## 2021-07-28 DIAGNOSIS — K5669 Other partial intestinal obstruction: Secondary | ICD-10-CM | POA: Diagnosis not present

## 2021-07-28 DIAGNOSIS — K5939 Other megacolon: Secondary | ICD-10-CM | POA: Diagnosis not present

## 2021-07-28 DIAGNOSIS — R531 Weakness: Secondary | ICD-10-CM | POA: Diagnosis not present

## 2021-07-28 DIAGNOSIS — Z86718 Personal history of other venous thrombosis and embolism: Secondary | ICD-10-CM | POA: Diagnosis not present

## 2021-07-28 DIAGNOSIS — K6389 Other specified diseases of intestine: Secondary | ICD-10-CM | POA: Diagnosis not present

## 2021-07-28 DIAGNOSIS — R112 Nausea with vomiting, unspecified: Secondary | ICD-10-CM | POA: Diagnosis not present

## 2021-07-28 DIAGNOSIS — K5732 Diverticulitis of large intestine without perforation or abscess without bleeding: Secondary | ICD-10-CM | POA: Diagnosis present

## 2021-07-28 DIAGNOSIS — Z8042 Family history of malignant neoplasm of prostate: Secondary | ICD-10-CM | POA: Diagnosis not present

## 2021-07-28 DIAGNOSIS — K56600 Partial intestinal obstruction, unspecified as to cause: Secondary | ICD-10-CM | POA: Diagnosis present

## 2021-07-28 DIAGNOSIS — R1110 Vomiting, unspecified: Secondary | ICD-10-CM | POA: Diagnosis not present

## 2021-07-28 DIAGNOSIS — R109 Unspecified abdominal pain: Secondary | ICD-10-CM | POA: Diagnosis not present

## 2021-07-28 DIAGNOSIS — Z7982 Long term (current) use of aspirin: Secondary | ICD-10-CM | POA: Diagnosis not present

## 2021-07-28 DIAGNOSIS — Z791 Long term (current) use of non-steroidal anti-inflammatories (NSAID): Secondary | ICD-10-CM | POA: Diagnosis not present

## 2021-07-28 LAB — CBC
HCT: 42.7 % (ref 39.0–52.0)
Hemoglobin: 14 g/dL (ref 13.0–17.0)
MCH: 28 pg (ref 26.0–34.0)
MCHC: 32.8 g/dL (ref 30.0–36.0)
MCV: 85.4 fL (ref 80.0–100.0)
Platelets: 213 10*3/uL (ref 150–400)
RBC: 5 MIL/uL (ref 4.22–5.81)
RDW: 14.2 % (ref 11.5–15.5)
WBC: 8.6 10*3/uL (ref 4.0–10.5)
nRBC: 0 % (ref 0.0–0.2)

## 2021-07-28 LAB — BASIC METABOLIC PANEL
Anion gap: 11 (ref 5–15)
BUN: 29 mg/dL — ABNORMAL HIGH (ref 8–23)
CO2: 27 mmol/L (ref 22–32)
Calcium: 9.4 mg/dL (ref 8.9–10.3)
Chloride: 101 mmol/L (ref 98–111)
Creatinine, Ser: 1 mg/dL (ref 0.61–1.24)
GFR, Estimated: >60 mL/min (ref >60)
Glucose, Bld: 151 mg/dL — ABNORMAL HIGH (ref 70–99)
Potassium: 4.1 mmol/L (ref 3.5–5.1)
Sodium: 139 mmol/L (ref 135–145)

## 2021-07-28 LAB — RESP PANEL BY RT-PCR (FLU A&B, COVID) ARPGX2
Influenza A by PCR: NEGATIVE
Influenza B by PCR: NEGATIVE
SARS Coronavirus 2 by RT PCR: NEGATIVE

## 2021-07-28 MED ORDER — ONDANSETRON HCL 4 MG PO TABS: 4.0000 mg | ORAL_TABLET | Freq: Four times a day (QID) | ORAL | Status: DC | PRN

## 2021-07-28 MED ORDER — MORPHINE SULFATE (PF) 2 MG/ML IV SOLN
2.0000 mg | INTRAVENOUS | Status: DC | PRN
  Administered 2021-07-28: 4 mg via INTRAVENOUS
  Administered 2021-07-29 – 2021-08-01 (×6): 2 mg via INTRAVENOUS
  Filled 2021-07-28 (×4): qty 1
  Filled 2021-07-28: qty 2
  Filled 2021-07-28 (×2): qty 1

## 2021-07-28 MED ORDER — METRONIDAZOLE 500 MG/100ML IV SOLN
500.0000 mg | Freq: Two times a day (BID) | INTRAVENOUS | Status: DC
  Administered 2021-07-28 – 2021-08-03 (×13): 500 mg via INTRAVENOUS
  Filled 2021-07-28 (×13): qty 100

## 2021-07-28 MED ORDER — IOHEXOL 350 MG/ML SOLN
80.0000 mL | Freq: Once | INTRAVENOUS | Status: AC | PRN
  Administered 2021-07-28: 80 mL via INTRAVENOUS

## 2021-07-28 MED ORDER — SODIUM CHLORIDE 0.9 % IV SOLN
2.0000 g | INTRAVENOUS | Status: DC
  Administered 2021-07-29 – 2021-08-03 (×7): 2 g via INTRAVENOUS
  Filled 2021-07-28 (×7): qty 20

## 2021-07-28 MED ORDER — PHENOL 1.4 % MT LIQD
1.0000 | OROMUCOSAL | Status: DC | PRN
  Administered 2021-07-28: 1 via OROMUCOSAL
  Filled 2021-07-28: qty 177

## 2021-07-28 MED ORDER — ENOXAPARIN SODIUM 40 MG/0.4ML IJ SOSY
40.0000 mg | PREFILLED_SYRINGE | INTRAMUSCULAR | Status: DC
  Administered 2021-07-28 – 2021-08-03 (×7): 40 mg via SUBCUTANEOUS
  Filled 2021-07-28 (×7): qty 0.4

## 2021-07-28 MED ORDER — LACTATED RINGERS IV SOLN: INTRAVENOUS | Status: DC

## 2021-07-28 MED ORDER — SODIUM CHLORIDE 0.9 % IV SOLN
12.5000 mg | Freq: Four times a day (QID) | INTRAVENOUS | Status: DC | PRN
  Administered 2021-07-28 – 2021-08-02 (×8): 12.5 mg via INTRAVENOUS
  Filled 2021-07-28 (×4): qty 12.5
  Filled 2021-07-28 (×2): qty 0.5
  Filled 2021-07-28 (×2): qty 12.5
  Filled 2021-07-28: qty 0.5

## 2021-07-28 MED ORDER — MORPHINE SULFATE (PF) 4 MG/ML IV SOLN
4.0000 mg | Freq: Once | INTRAVENOUS | Status: AC
  Administered 2021-07-28: 4 mg via INTRAVENOUS
  Filled 2021-07-28: qty 1

## 2021-07-28 MED ORDER — ONDANSETRON HCL 4 MG/2ML IJ SOLN
4.0000 mg | Freq: Four times a day (QID) | INTRAMUSCULAR | Status: DC | PRN
  Administered 2021-07-28 – 2021-08-01 (×5): 4 mg via INTRAVENOUS
  Filled 2021-07-28 (×6): qty 2

## 2021-07-28 MED ORDER — SODIUM CHLORIDE (PF) 0.9 % IJ SOLN
INTRAMUSCULAR | Status: AC
  Filled 2021-07-28: qty 50

## 2021-07-28 NOTE — Progress Notes (Signed)
Same day note  Patient seen and examined at bedside.  Patient was admitted to the hospital for nausea vomiting abdominal pain.  At the time of my evaluation, patient complains of vomiting yesterday but not today.  Passing gas.  No overt pain at this time.  Physical examination reveals average built male, alert awake and communicative, not in distress.  Abdomen mildly distended.  Bowel sounds heard.  Laboratory data and imaging was reviewed  Assessment and Plan.  Partial small bowel obstruction with a recent diagnosis of uncomplicated diverticulitis. Continue conservative treatment with n.p.o. IV fluids.  Unable to continue NG tube but is not actively vomiting.  Spoke with general surgery for consultation.  Continue Rocephin and Flagyl.    History of hypertension.  Hold p.o. medication.  As needed antihypertensives for now.    No Charge  Signed,  Delila Pereyra, MD [facility]

## 2021-07-28 NOTE — H&P (Addendum)
History and Physical    Glen Freeman PJA:[id number] DOB: 15-Feb-1951 DOA: 07/27/2021  PCP: Velna Hatchet, MD  Patient coming from: Home  I have personally briefly reviewed patient's old medical records in [place]  Chief Complaint: Abd pain, N/V  HPI: Glen Freeman is a 70 y.o. male with medical history significant of HTN.  Pt presented to ED for N/V, onset Sat night.  Seen in ED on 12/2 initially, seemed to be doing better with nausea control and discharged after being diagnosed with uncomplicated diverticulitis.  Symptoms reoccurred today, severe.  Last BM earlier today.  Now having N/V and abd pain.   ED Course: CT scan reveals SBO with transition point in LLQ.   Review of Systems: As per HPI, otherwise all review of systems negative.  Past Medical History:  Diagnosis Date   Allergic rhinitis    Diverticulosis    GERD (gastroesophageal reflux disease)    History of kidney stones    History of thrombophlebitis    2010  superficial lower extremity,  completed 6 months coumadin   Hypertension    followed by pcp   (08-23-2019 per has never had a stress test)   Nocturia    Prostate cancer [facility]) urologist-- dr winter/ oncologist-- dr Tammi Klippel   dx 05-24-2019--- Stage T1c,  Gleason 4+3   Venous insufficiency    Left  leg; with saphenous varicositis,     Past Surgical History:  Procedure Laterality Date   COLONOSCOPY  last one 05-12-2016   CYSTOSCOPY N/A 08/24/2019   Procedure: CYSTOSCOPY;  Surgeon: Ceasar Mons, MD;  Location: [facility];  Service: Urology;  Laterality: N/A;   endovascular laser ablation  2010   of veins in Left  leg per Dr. Kellie Simmering   KNEE RECONSTRUCTION Right 1970   football injury   [place] N/A 08/24/2019   Procedure: RADIOACTIVE SEED IMPLANT/BRACHYTHERAPY IMPLANT;  Surgeon: Ceasar Mons, MD;  Location: [facility];  Service: Urology;  Laterality: N/A;  only needs  90 min for all procedures   SPACE OAR INSTILLATION N/A 08/24/2019   Procedure: SPACE OAR INSTILLATION;  Surgeon: Ceasar Mons, MD;  Location: [facility];  Service: Urology;  Laterality: N/A;     reports that he has never smoked. He has never used smokeless tobacco. He reports that he does not currently use alcohol. He reports that he does not use drugs.  No Known Allergies  Family History  Problem Relation Age of Onset   Breast cancer Other        family hx - 1st degree relatvie <50   Diabetes Other        1st degree relative   Hypertension Other    Prostate cancer Other        1st degree relative <50    Colon cancer Neg Hx      Prior to Admission medications   Medication Sig Start Date End Date Taking? Authorizing Provider  albuterol (VENTOLIN HFA) 108 (90 Base) MCG/ACT inhaler Inhale into the lungs every 4 (four) hours as needed.  05/06/15   [provider]  aluminum-magnesium hydroxide 200-200 MG/5ML suspension Take 10 mLs by mouth every 6 (six) hours as needed for up to 5 days for indigestion. 07/26/21 07/31/21  Varney Biles, MD  amLODipine (NORVASC) 5 MG tablet Take 5 mg by mouth at bedtime.  04/21/19   [provider]  amoxicillin-clavulanate (AUGMENTIN) 875-125 MG tablet Take 1 tablet by mouth every 12 (  twelve) hours. 07/25/21   Dorie Rank, MD  Ascorbic Acid (VITAMIN C) 1000 MG tablet Take 1,000 mg by mouth daily.    [provider]  aspirin 81 MG EC tablet Take 81 mg by mouth 3 (three) times a week.     [provider]  fluticasone (FLONASE) 50 MCG/ACT nasal spray Place 1 spray into both nostrils as needed. 03/08/16   [provider]  guaiFENesin (COUGH SYRUP PO) Take by mouth as needed.    [provider]  HYDROcodone-acetaminophen (NORCO/VICODIN) 5-325 MG tablet Take 1 tablet by mouth every 6 (six) hours as needed. 07/25/21   Dorie Rank, MD  ibuprofen (ADVIL,MOTRIN) 200 MG tablet Take 200 mg by  mouth every 6 (six) hours as needed.    [provider]  magnesium 30 MG tablet Take 30 mg by mouth 2 (two) times daily.    [provider]  Multiple Vitamin (MULTIVITAMIN) tablet Take 1 tablet by mouth as needed.     [provider]  naproxen (NAPROSYN) 375 MG tablet Take 1 tablet (375 mg total) by mouth 2 (two) times daily. 07/25/21   Dorie Rank, MD  omeprazole (PRILOSEC) 20 MG capsule Take 20 mg by mouth as needed.    [provider]  promethazine (PHENERGAN) 25 MG suppository Place 1 suppository (25 mg total) rectally every 6 (six) hours as needed for nausea or vomiting. 07/26/21   Varney Biles, MD    Physical Exam: Vitals:   07/27/21 1823 07/27/21 2325 07/28/21 0030 07/28/21 0130  BP: (!) 156/101 (!) 164/94 (!) 163/92 (!) 165/99  Pulse: 88 84 75 78  Resp: 14 18 18 20   Temp: 99.6 F (37.6 C)     TempSrc: Oral     SpO2: 95% 97% 95% 95%    Constitutional: NAD, calm, comfortable Eyes: PERRL, lids and conjunctivae normal ENMT: Mucous membranes are moist. Posterior pharynx clear of any exudate or lesions.Normal dentition.  Neck: normal, supple, no masses, no thyromegaly Respiratory: clear to auscultation bilaterally, no wheezing, no crackles. Normal respiratory effort. No accessory muscle use.  Cardiovascular: Regular rate and rhythm, no murmurs / rubs / gallops. No extremity edema. 2+ pedal pulses. No carotid bruits.  Abdomen: Mild distention Musculoskeletal: no clubbing / cyanosis. No joint deformity upper and lower extremities. Good ROM, no contractures. Normal muscle tone.  Skin: no rashes, lesions, ulcers. No induration Neurologic: CN 2-12 grossly intact. Sensation intact, DTR normal. Strength 5/5 in all 4.  Psychiatric: Normal judgment and insight. Alert and oriented x 3. Normal mood.    Labs on Admission: I have personally reviewed following labs and imaging studies  CBC: Recent Labs  Lab 07/25/21 0854 07/26/21 1053 07/27/21 1252   WBC 8.3 11.6* 10.7*  NEUTROABS 6.4 10.0* 8.7*  HGB 13.1 14.4 14.6  HCT 40.6 44.4 45.1  MCV 86.2 86.4 86.9  PLT 177 223 875   Basic Metabolic Panel: Recent Labs  Lab 07/25/21 0854 07/26/21 1053 07/27/21 1252  NA 136 135 135  K 3.8 4.4 4.6  CL 104 97* 95*  CO2 26 28 30   GLUCOSE 106* 126* 126*  BUN 13 17 24*  CREATININE 1.04 1.13 1.12  CALCIUM 8.8* 9.4 9.5   GFR: CrCl cannot be calculated (Unknown ideal weight.). Liver Function Tests: Recent Labs  Lab 07/25/21 0854 07/27/21 1252  AST 15 16  ALT 23 19  ALKPHOS 56 51  BILITOT 1.4* 0.9  PROT 6.2* 7.6  ALBUMIN 3.5 4.1   Recent Labs  Lab 07/25/21  0277  LIPASE 24   No results for input(s): AMMONIA in the last 168 hours. Coagulation Profile: No results for input(s): INR, PROTIME in the last 168 hours. Cardiac Enzymes: No results for input(s): CKTOTAL, CKMB, CKMBINDEX, TROPONINI in the last 168 hours. BNP (last 3 results) No results for input(s): PROBNP in the last 8760 hours. HbA1C: No results for input(s): HGBA1C in the last 72 hours. CBG: No results for input(s): GLUCAP in the last 168 hours. Lipid Profile: No results for input(s): CHOL, HDL, LDLCALC, TRIG, CHOLHDL, LDLDIRECT in the last 72 hours. Thyroid Function Tests: No results for input(s): TSH, T4TOTAL, FREET4, T3FREE, THYROIDAB in the last 72 hours. Anemia Panel: No results for input(s): VITAMINB12, FOLATE, FERRITIN, TIBC, IRON, RETICCTPCT in the last 72 hours. Urine analysis:    Component Value Date/Time   COLORURINE YELLOW 07/27/2021 2344   APPEARANCEUR HAZY (A) 07/27/2021 2344   LABSPEC 1.030 07/27/2021 2344   PHURINE 5.0 07/27/2021 2344   GLUCOSEU NEGATIVE 07/27/2021 2344   HGBUR NEGATIVE 07/27/2021 2344   BILIRUBINUR NEGATIVE 07/27/2021 2344   BILIRUBINUR neg 02/13/2015 1029   KETONESUR NEGATIVE 07/27/2021 2344   PROTEINUR 100 (A) 07/27/2021 2344   UROBILINOGEN 1.0 02/13/2015 1029   NITRITE NEGATIVE 07/27/2021 2344   LEUKOCYTESUR NEGATIVE  07/27/2021 2344    Radiological Exams on Admission: CT Abdomen Pelvis W Contrast  Result Date: 07/28/2021 CLINICAL DATA:  Nausea, vomiting and weakness. EXAM: CT ABDOMEN AND PELVIS WITH CONTRAST TECHNIQUE: Multidetector CT imaging of the abdomen and pelvis was performed using the standard protocol following bolus administration of intravenous contrast. CONTRAST:  61mL OMNIPAQUE IOHEXOL 350 MG/ML SOLN COMPARISON:  July 25, 2021 FINDINGS: Lower chest: Mild to moderate severity areas of atelectasis are noted within the posterior aspect of the bilateral lung bases. Hepatobiliary: No focal liver abnormality is seen. No gallstones, gallbladder wall thickening, or biliary dilatation. Heterogeneous increased attenuation is seen within the dependent portion of the gallbladder lumen. Pancreas: Unremarkable. No pancreatic ductal dilatation or surrounding inflammatory changes. Spleen: Normal in size without focal abnormality. Adrenals/Urinary Tract: Adrenal glands are unremarkable. Kidneys are normal, without renal calculi, focal lesion, or hydronephrosis. Bladder is unremarkable. Stomach/Bowel: Stomach is within normal limits. Numerous dilated small bowel loops are seen within the abdomen and pelvis. A transition zone is noted within the left lower quadrant/left hemipelvis (axial CT images 55 through 62, CT series 2). This is along the expected region of the proximal ileum. Noninflamed diverticula are noted within the descending and sigmoid colon. Vascular/Lymphatic: No significant vascular findings are present. No enlarged abdominal or pelvic lymph nodes. 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 Other: A very small fat containing umbilical hernia is noted. No abdominopelvic ascites. Musculoskeletal: Degenerative changes are seen within the lumbar spine. IMPRESSION: 1. Small-bowel obstruction with a transition zone noted within the left lower quadrant/left hemipelvis. 2. Colonic  diverticulosis. 3. Heterogeneous increased attenuation within the gallbladder lumen which may represent retained contrast versus gallbladder sludge. Electronically Signed   By: Virgina Norfolk M.D.   On: 07/28/2021 01:16   DG Abd Acute W/Chest  Result Date: 07/28/2021 CLINICAL DATA:  Nausea and vomiting with generalized weakness. EXAM: DG ABDOMEN ACUTE WITH 1 VIEW CHEST COMPARISON:  None. FINDINGS: Multiple dilated small bowel loops are seen throughout the abdomen. There is no evidence of free air. Radiopaque contrast is seen throughout portions of the large bowel which is normal in caliber. No radiopaque calculi are seen. Heart size and mediastinal contours are within normal limits. A trace amount  of atelectasis is seen within the left lung base. IMPRESSION: 1. Findings consistent with a partial small bowel obstruction versus ileus. 2. Trace amount of left basilar atelectasis. Electronically Signed   By: Virgina Norfolk M.D.   On: 07/28/2021 00:24    EKG: Independently reviewed.  Assessment/Plan Principal Problem:   SBO (small bowel obstruction) (HCC) Active Problems:   Essential hypertension   Diverticulitis with obstruction (HCC)    SBO, apparently from diverticulitis based on prior CT scan - NPO IVF NGT attempted by ED RNs, unable to place in either nare. Will see if gen surg can get this in later this AM Morphine PRN pain Call general surgery in AM Diverticulitis - Rocephin + Flagyl Complicated by SBO No perf nor abscess at this point HTN - Hold PO meds for the moment Add PRN if needed  DVT prophylaxis: Lovenox (start 10am tomorrow) Code Status: Full Family Communication: Wife at bedside Disposition Plan: Home after SBO and diverticulitis resolved Consults called: None Admission status: Admit to inpatient  Severity of Illness: The appropriate patient status for this patient is INPATIENT. Inpatient status is judged to be reasonable and necessary in order to provide the  required intensity of service to ensure the patient's safety. The patient's presenting symptoms, physical exam findings, and initial radiographic and laboratory data in the context of their chronic comorbidities is felt to place them at high risk for further clinical deterioration. Furthermore, it is not anticipated that the patient will be medically stable for discharge from the hospital within 2 midnights of admission. The following factors support the patient status of inpatient.   IP status for diverticulitis complicated by: 1) SBO 2) failed outpt   * I certify that at the point of admission it is my clinical judgment that the patient will require inpatient hospital care spanning beyond 2 midnights from the point of admission due to high intensity of service, high risk for further deterioration and high frequency of surveillance required.*   Murtaza Shell M. DO [facility]  How to contact the [facility] Attending or Consulting provider [place] or covering provider during after hours [place], for this patient?  Check the care team in [facility] and look for a) attending/consulting [facility] provider listed and b) the [facility] team listed Log into [email or web address]  Amion Physician Scheduling and messaging for groups and whole hospitals  On call and physician scheduling software for group practices, residents, hospitalists and other medical providers for call, clinic, rotation and shift schedules. On[employer] is a hospital-wide system for scheduling doctors and paging doctors on call. EasyPlot is for scientific plotting and data analysis.  [email or web address]  and use [place]'s universal password to access. If you do not have the password, please contact the hospital operator.  Locate the [facility] provider you are looking for under [facility] and page to a number that you can be directly reached. If you still have difficulty reaching the provider, please page the [facility] (Director on Call) for the Hospitalists  listed on amion for assistance.  07/28/2021, 2:57 AM

## 2021-07-28 NOTE — ED Notes (Signed)
This RN and Natosha, RN, attempted to insert an NG tube in each nare. Pt was not able to tolerate the NG tube. Alcario Drought, DO, made aware.

## 2021-07-28 NOTE — Consult Note (Signed)
Consult Note  Glen Freeman 21-Apr-1951  [id number].    Requesting MD: Dr. Louanne Belton Chief Complaint/Reason for Consult: SBO  HPI:  70 yo male with medical history significant for HTN, history of DVT, history of kidney stones who presented to the ED on 10/2 secondary to nausea/emesis beginning Saturday night (3 days ago). He was seen in the ED on 10/1 as well for abdominal pain and diagnosed with diverticulitis and started on PO abx and discharged home. On second presentation to ED patient was diagnosed with SBO and admitted to the hospitalist service  Work up in ED 10/3 significant for CT 10/4 showing SBO with transition point in LLQ, possible gallbladder sludge. WBC initially 10.7 now 8.6.   RN attempted with difficulty NGT placement but patient did not tolerate and attempt terminated.  Today patient is drowsy. He states abdominal pain, bloating, nausea/emesis have all improved. He is passing flatus but also has hiccoughs. He denies other complaints and ROS otherwise as below  Substance use: none Allergies: none Blood thinners: none Past Surgeries: no prior abdominal surgeries  Wife Ruby is bedside   ROS: Review of Systems  Constitutional:  Negative for chills and fever.  Respiratory:  Negative for cough, shortness of breath and wheezing.   Cardiovascular:  Negative for chest pain and leg swelling.  Gastrointestinal:  Positive for abdominal pain, nausea and vomiting. Negative for constipation and diarrhea.  Genitourinary: Negative.    Family History  Problem Relation Age of Onset   Breast cancer Other        family hx - 1st degree relatvie <50   Diabetes Other        1st degree relative   Hypertension Other    Prostate cancer Other        1st degree relative <50    Colon cancer Neg Hx     Past Medical History:  Diagnosis Date   Allergic rhinitis    Diverticulosis    GERD (gastroesophageal reflux disease)    History of kidney stones    History of  thrombophlebitis    2010  superficial lower extremity,  completed 6 months coumadin   Hypertension    followed by pcp   (08-23-2019 per has never had a stress test)   Nocturia    Prostate cancer [facility]) urologist-- dr winter/ oncologist-- dr Tammi Klippel   dx 05-24-2019--- Stage T1c,  Gleason 4+3   Venous insufficiency    Left  leg; with saphenous varicositis,     Past Surgical History:  Procedure Laterality Date   COLONOSCOPY  last one 05-12-2016   CYSTOSCOPY N/A 08/24/2019   Procedure: CYSTOSCOPY;  Surgeon: Ceasar Mons, MD;  Location: [facility];  Service: Urology;  Laterality: N/A;   endovascular laser ablation  2010   of veins in Left  leg per Dr. Kellie Simmering   KNEE RECONSTRUCTION Right 1970   football injury   [place] N/A 08/24/2019   Procedure: RADIOACTIVE SEED IMPLANT/BRACHYTHERAPY IMPLANT;  Surgeon: Ceasar Mons, MD;  Location: [facility];  Service: Urology;  Laterality: N/A;  only needs 90 min for all procedures   SPACE OAR INSTILLATION N/A 08/24/2019   Procedure: SPACE OAR INSTILLATION;  Surgeon: Ceasar Mons, MD;  Location: [facility];  Service: Urology;  Laterality: N/A;    Social History:  reports that he has never smoked. He has never used smokeless tobacco. He reports that he does not currently use alcohol. He reports that  he does not use drugs.  Allergies: No Known Allergies  Medications Prior to Admission  Medication Sig Dispense Refill   Ascorbic Acid (VITAMIN C) 1000 MG tablet Take 1,000 mg by mouth daily.     aspirin 81 MG EC tablet Take 81 mg by mouth 3 (three) times a week.      ibuprofen (ADVIL,MOTRIN) 200 MG tablet Take 200 mg by mouth every 6 (six) hours as needed for mild pain.     magnesium 30 MG tablet Take 30 mg by mouth 2 (two) times daily.     Multiple Vitamin (MULTIVITAMIN) tablet Take 1 tablet by mouth daily.     omeprazole (PRILOSEC) 20 MG capsule Take 20  mg by mouth daily as needed (for acid reflux).     aluminum-magnesium hydroxide 200-200 MG/5ML suspension Take 10 mLs by mouth every 6 (six) hours as needed for up to 5 days for indigestion. 200 mL 0   amLODipine (NORVASC) 5 MG tablet Take 5 mg by mouth at bedtime.      amoxicillin-clavulanate (AUGMENTIN) 875-125 MG tablet Take 1 tablet by mouth every 12 (twelve) hours. 20 tablet 0   HYDROcodone-acetaminophen (NORCO/VICODIN) 5-325 MG tablet Take 1 tablet by mouth every 6 (six) hours as needed. (Patient taking differently: Take 1 tablet by mouth every 6 (six) hours as needed for moderate pain.) 12 tablet 0   naproxen (NAPROSYN) 375 MG tablet Take 1 tablet (375 mg total) by mouth 2 (two) times daily. 20 tablet 0   promethazine (PHENERGAN) 25 MG suppository Place 1 suppository (25 mg total) rectally every 6 (six) hours as needed for nausea or vomiting. 12 each 0   tamsulosin (FLOMAX) 0.4 MG CAPS capsule Take 0.4 mg by mouth at bedtime.      Blood pressure (!) 152/92, pulse 99, temperature 98.6 F (37 C), temperature source Oral, resp. rate 18, SpO2 94 %. Physical Exam:  General: pleasant, WD, male who is laying in bed in NAD. drowsy HEENT: head is normocephalic, atraumatic.  Sclera are noninjected.  Pupils equal and round.  Ears and nose without any masses or lesions.  Mouth is pink and moist Heart: regular, rate, and rhythm.  Normal s1,s2. No obvious murmurs, gallops, or rubs noted.  Palpable radial and pedal pulses bilaterally Lungs: CTAB, no wheezes, rhonchi, or rales noted.  Respiratory effort nonlabored Abd: soft, NT, no masses, hernias, or organomegaly. Hypoactive bowel sounds. Mild distension MS: all 4 extremities are symmetrical with no cyanosis, clubbing, or edema. Skin: warm and dry with no masses, lesions, or rashes Neuro: Cranial nerves 2-12 grossly intact, sensation is normal throughout Psych: A&Ox3 with an appropriate affect.   Results for orders placed or performed during the  hospital encounter of 07/27/21 (from the past 48 hour(s))  Comprehensive metabolic panel     Status: Abnormal   Collection Time: 07/27/21 12:52 PM  Result Value Ref Range   Sodium 135 135 - 145 mmol/L   Potassium 4.6 3.5 - 5.1 mmol/L   Chloride 95 (L) 98 - 111 mmol/L   CO2 30 22 - 32 mmol/L   Glucose, Bld 126 (H) 70 - 99 mg/dL    Comment: Glucose reference range applies only to samples taken after fasting for at least 8 hours.   BUN 24 (H) 8 - 23 mg/dL   Creatinine, Ser 1.12 0.61 - 1.24 mg/dL   Calcium 9.5 8.9 - 10.3 mg/dL   Total Protein 7.6 6.5 - 8.1 g/dL   Albumin 4.1 3.5 - 5.0 g/dL  AST 16 15 - 41 U/L   ALT 19 0 - 44 U/L   Alkaline Phosphatase 51 38 - 126 U/L   Total Bilirubin 0.9 0.3 - 1.2 mg/dL   GFR, Estimated >60 >60 mL/min    Comment: (NOTE) Calculated using the CKD-EPI Creatinine Equation (2021)    Anion gap 10 5 - 15    Comment: Performed at [facility], [place] [address], [place], [place] [address]  CBC with Differential     Status: Abnormal   Collection Time: 07/27/21 12:52 PM  Result Value Ref Range   WBC 10.7 (H) 4.0 - 10.5 K/uL   RBC 5.19 4.22 - 5.81 MIL/uL   Hemoglobin 14.6 13.0 - 17.0 g/dL   HCT 45.1 39.0 - 52.0 %   MCV 86.9 80.0 - 100.0 fL   MCH 28.1 26.0 - 34.0 pg   MCHC 32.4 30.0 - 36.0 g/dL   RDW 14.3 11.5 - 15.5 %   Platelets 236 150 - 400 K/uL   nRBC 0.0 0.0 - 0.2 %   Neutrophils Relative % 80 %   Neutro Abs 8.7 (H) 1.7 - 7.7 K/uL   Lymphocytes Relative 9 %   Lymphs Abs 0.9 0.7 - 4.0 K/uL   Monocytes Relative 9 %   Monocytes Absolute 0.9 0.1 - 1.0 K/uL   Eosinophils Relative 1 %   Eosinophils Absolute 0.1 0.0 - 0.5 K/uL   Basophils Relative 0 %   Basophils Absolute 0.0 0.0 - 0.1 K/uL   Immature Granulocytes 1 %   Abs Immature Granulocytes 0.08 (H) 0.00 - 0.07 K/uL    Comment: Performed at [facility], [place] [address], [place], [place] [address]  Urinalysis, Routine w reflex microscopic Urine, Clean Catch      Status: Abnormal   Collection Time: 07/27/21 11:44 PM  Result Value Ref Range   Color, Urine YELLOW YELLOW   APPearance HAZY (A) CLEAR   Specific Gravity, Urine 1.030 1.005 - 1.030   pH 5.0 5.0 - 8.0   Glucose, UA NEGATIVE NEGATIVE mg/dL   Hgb urine dipstick NEGATIVE NEGATIVE   Bilirubin Urine NEGATIVE NEGATIVE   Ketones, ur NEGATIVE NEGATIVE mg/dL   Protein, ur 100 (A) NEGATIVE mg/dL   Nitrite NEGATIVE NEGATIVE   Leukocytes,Ua NEGATIVE NEGATIVE   RBC / HPF 6-10 0 - 5 RBC/hpf   WBC, UA 6-10 0 - 5 WBC/hpf   Bacteria, UA NONE SEEN NONE SEEN   Squamous Epithelial / LPF 0-5 0 - 5   Mucus PRESENT     Comment: Performed at [facility], [place] [address] [place], [place] [address]  Resp Panel by RT-PCR (Flu A&B, Covid) Nasopharyngeal Swab     Status: None   Collection Time: 07/28/21  1:49 AM   Specimen: Nasopharyngeal Swab; Nasopharyngeal(NP) swabs in vial transport medium  Result Value Ref Range   SARS Coronavirus 2 by RT PCR NEGATIVE NEGATIVE    Comment: (NOTE) SARS-CoV-2 target nucleic acids are NOT DETECTED.  The SARS-CoV-2 RNA is generally detectable in upper respiratory specimens during the acute phase of infection. The lowest concentration of SARS-CoV-2 viral copies this assay can detect is 138 copies/mL. A negative result does not preclude SARS-Cov-2 infection and should not be used as the sole basis for treatment or other patient management decisions. A negative result may occur with  improper specimen collection/handling, submission of specimen other than nasopharyngeal swab, presence of viral mutation(s) within the areas targeted by this assay, and inadequate number of  viral copies(<138 copies/mL). A negative result must be combined with clinical observations, patient history, and epidemiological information. The expected result is Negative.  Fact Sheet for Patients:  EntrepreneurPulse.com.au  Fact Sheet for Healthcare Providers:   IncredibleEmployment.be  This test is no t yet approved or cleared by the [place] FDA and  has been authorized for detection and/or diagnosis of SARS-CoV-2 by FDA under an Emergency Use Authorization (EUA). This EUA will remain  in effect (meaning this test can be used) for the duration of the COVID-19 declaration under Section 564(b)(1) of the Act, 21 U.S.C.section 360bbb-3(b)(1), unless the authorization is terminated  or revoked sooner.       Influenza A by PCR NEGATIVE NEGATIVE   Influenza B by PCR NEGATIVE NEGATIVE    Comment: (NOTE) The Xpert Xpress SARS-CoV-2/FLU/RSV plus assay is intended as an aid in the diagnosis of influenza from Nasopharyngeal swab specimens and should not be used as a sole basis for treatment. Nasal washings and aspirates are unacceptable for Xpert Xpress SARS-CoV-2/FLU/RSV testing.  Fact Sheet for Patients: EntrepreneurPulse.com.au  Fact Sheet for Healthcare Providers: IncredibleEmployment.be  This test is not yet approved or cleared by the [place] FDA and has been authorized for detection and/or diagnosis of SARS-CoV-2 by FDA under an Emergency Use Authorization (EUA). This EUA will remain in effect (meaning this test can be used) for the duration of the COVID-19 declaration under Section 564(b)(1) of the Act, 21 U.S.C. section 360bbb-3(b)(1), unless the authorization is terminated or revoked.  Performed at [facility], [place] [address], [place], [place] [address]   CBC     Status: None   Collection Time: 07/28/21  5:37 AM  Result Value Ref Range   WBC 8.6 4.0 - 10.5 K/uL   RBC 5.00 4.22 - 5.81 MIL/uL   Hemoglobin 14.0 13.0 - 17.0 g/dL   HCT 42.7 39.0 - 52.0 %   MCV 85.4 80.0 - 100.0 fL   MCH 28.0 26.0 - 34.0 pg   MCHC 32.8 30.0 - 36.0 g/dL   RDW 14.2 11.5 - 15.5 %   Platelets 213 150 - 400 K/uL   nRBC 0.0 0.0 - 0.2 %    Comment: Performed at [facility], [place] [address], [place], [place] [address]  Basic metabolic panel     Status: Abnormal   Collection Time: 07/28/21  5:37 AM  Result Value Ref Range   Sodium 139 135 - 145 mmol/L   Potassium 4.1 3.5 - 5.1 mmol/L   Chloride 101 98 - 111 mmol/L   CO2 27 22 - 32 mmol/L   Glucose, Bld 151 (H) 70 - 99 mg/dL    Comment: Glucose reference range applies only to samples taken after fasting for at least 8 hours.   BUN 29 (H) 8 - 23 mg/dL   Creatinine, Ser 1.00 0.61 - 1.24 mg/dL   Calcium 9.4 8.9 - 10.3 mg/dL   GFR, Estimated >60 >60 mL/min    Comment: (NOTE) Calculated using the CKD-EPI Creatinine Equation (2021)    Anion gap 11 5 - 15    Comment: Performed at [facility], [place] [address] [place], [place] [address]   CT Abdomen Pelvis W Contrast  Result Date: 07/28/2021 CLINICAL DATA:  Nausea, vomiting and weakness. EXAM: CT ABDOMEN AND PELVIS WITH CONTRAST TECHNIQUE: Multidetector CT imaging of the abdomen and pelvis was performed using the standard protocol following bolus administration of intravenous contrast. CONTRAST:  75mL OMNIPAQUE IOHEXOL 350 MG/ML  SOLN COMPARISON:  July 25, 2021 FINDINGS: Lower chest: Mild to moderate severity areas of atelectasis are noted within the posterior aspect of the bilateral lung bases. Hepatobiliary: No focal liver abnormality is seen. No gallstones, gallbladder wall thickening, or biliary dilatation. Heterogeneous increased attenuation is seen within the dependent portion of the gallbladder lumen. Pancreas: Unremarkable. No pancreatic ductal dilatation or surrounding inflammatory changes. Spleen: Normal in size without focal abnormality. Adrenals/Urinary Tract: Adrenal glands are unremarkable. Kidneys are normal, without renal calculi, focal lesion, or hydronephrosis. Bladder is unremarkable. Stomach/Bowel: Stomach is within normal limits. Numerous dilated small bowel loops are seen within the abdomen and pelvis. A  transition zone is noted within the left lower quadrant/left hemipelvis (axial CT images 55 through 62, CT series 2). This is along the expected region of the proximal ileum. Noninflamed diverticula are noted within the descending and sigmoid colon. Vascular/Lymphatic: No significant vascular findings are present. No enlarged abdominal or pelvic lymph nodes. 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 Other: A very small fat containing umbilical hernia is noted. No abdominopelvic ascites. Musculoskeletal: Degenerative changes are seen within the lumbar spine. IMPRESSION: 1. Small-bowel obstruction with a transition zone noted within the left lower quadrant/left hemipelvis. 2. Colonic diverticulosis. 3. Heterogeneous increased attenuation within the gallbladder lumen which may represent retained contrast versus gallbladder sludge. Electronically Signed   By: Virgina Norfolk M.D.   On: 07/28/2021 01:16   DG Abd Acute W/Chest  Result Date: 07/28/2021 CLINICAL DATA:  Nausea and vomiting with generalized weakness. EXAM: DG ABDOMEN ACUTE WITH 1 VIEW CHEST COMPARISON:  None. FINDINGS: Multiple dilated small bowel loops are seen throughout the abdomen. There is no evidence of free air. Radiopaque contrast is seen throughout portions of the large bowel which is normal in caliber. No radiopaque calculi are seen. Heart size and mediastinal contours are within normal limits. A trace amount of atelectasis is seen within the left lung base. IMPRESSION: 1. Findings consistent with a partial small bowel obstruction versus ileus. 2. Trace amount of left basilar atelectasis. Electronically Signed   By: Virgina Norfolk M.D.   On: 07/28/2021 00:24      Assessment/Plan Diverticulitis SBO w/o h/o prior abd sx - continue abx for diverticulitis - WBC now normal and no pain on exam today - CT showing SBO with transition point in LLQ, possible gallbladder sludge.  - No current indication for  emergency surgery - reattempt NGT placement today and start protocol. If unsuccessful will consider consult to IR - continue bowel rest - Keep K > 4 and Mg > 2 for bowel function - Mobilize for bowel function - Hopefully patient will improve with conservative management. If patient fails to improve with conservative management, he may require exploratory surgery during admission  FEN: NPO ID: [email or web address] VTE: [email or web address]  HTN  Winferd Humphrey, [facility] 07/28/2021, 10:03 AM Please see Amion for pager number during day hours 7:00am-4:30pm

## 2021-07-29 ENCOUNTER — Inpatient Hospital Stay (HOSPITAL_COMMUNITY): Payer: BC Managed Care – PPO

## 2021-07-29 DIAGNOSIS — K56609 Unspecified intestinal obstruction, unspecified as to partial versus complete obstruction: Secondary | ICD-10-CM | POA: Diagnosis not present

## 2021-07-29 DIAGNOSIS — K5792 Diverticulitis of intestine, part unspecified, without perforation or abscess without bleeding: Secondary | ICD-10-CM | POA: Diagnosis not present

## 2021-07-29 DIAGNOSIS — I10 Essential (primary) hypertension: Secondary | ICD-10-CM | POA: Diagnosis not present

## 2021-07-29 MED ORDER — SODIUM CHLORIDE 0.9 % IV SOLN: INTRAVENOUS | Status: DC

## 2021-07-29 MED ORDER — DIATRIZOATE MEGLUMINE & SODIUM 66-10 % PO SOLN
90.0000 mL | Freq: Once | ORAL | Status: AC
  Administered 2021-07-29: 90 mL via NASOGASTRIC
  Filled 2021-07-29: qty 90

## 2021-07-29 NOTE — Hospital Course (Addendum)
Glen Freeman is a 69 y.o. M with hx HTN not on meds, prosCA, remote DVT who presented with 2-3 days progressive abdominal pain and vomiting.    Initially presented 2 days prior to admission to the ER with new onset severe left-sided abdominal pain, and vomiting.  At that time nausea was controlled, CT showed only uncomplicated diverticulitis and he was discharged with antibiotics.  The pain returned so he came back to the ER the next day with worsening abdominal pain and vomiting, CT now showed small bowel obstruction with transition point in the left lower quadrant.      10/5 admitted and NG placed, large amount of output 10/6 had BM, Gen Surg clamped tube

## 2021-07-29 NOTE — Progress Notes (Signed)
  Progress Note    Glen Freeman   KCM:[id number]  DOB: October 11, 1951  DOA: 07/27/2021     1 Date of Service: 07/29/2021    Brief summary: Glen Freeman is a 70 y.o. M with HTN who presented with 2-3 days progressive abdominal pain and vomiting.    Initially presented 2 days prior to admission to the ER with new onset severe left-sided abdominal pain, and vomiting.  At that time nausea was controlled, CT showed only uncomplicated diverticulitis and he was discharged with antibiotics.  The pain returned so he came back to the ER the next day with worsening abdominal pain and vomiting, CT now showed small bowel obstruction with transition point in the left lower quadrant.             Subjective:  No fever, confusion.  No chest pain, dyspnea.  Actually not much abdominal pain.  Feels better since putting in the NG tube.  No bowel movement.  Hospital Problems * SBO (small bowel obstruction) (HCC) NG tube placed in the ER  - Small bowel obstruction x-ray Gastrografin protocol -Consult surgery, appreciate cares - Zofran for nausea, morphine for pain -IV fluids - N.p.o.  Diverticulitis with obstruction (HCC) No fever - Continue Rocephin and Flagyl  Essential hypertension  Not on meds at home     Objective Vital signs were reviewed and unremarkable.  Vitals:   07/28/21 1750 07/28/21 2018 07/29/21 0518 07/29/21 1327  BP: (!) 145/80 (!) 154/83 135/77 136/87  Pulse: 84 84 75 79  Resp: 17 18 18 18   Temp: 99.3 F (37.4 C) 99.8 F (37.7 C) 98.1 F (36.7 C) 98.5 F (36.9 C)  TempSrc: Oral Oral Oral Oral  SpO2: 98% 94% 96% 96%  Weight:      Height:       102.9 kg  Exam General appearance: Adult male, well-nourished, no acute distress, NG tube in place, lying in bed     HEENT:    Skin:  Cardiac: RRR, no murmurs, no lower extremity edema Respiratory: Normal respiratory rate and rhythm, lungs clear without rales or wheezes. Abdomen: Abdomen soft with some voluntary guarding,  no focal tenderness, maybe a little bit of tenderness in the left lower quadrant, large amount of bilious output in his canister MSK:  Neuro: Awake and alert, extraocular movements intact, moves all extremities and normal strength and coordination, speech fluent Psych: Attention normal, affect appropriate, judgment insight appear normal    Labs / Other Information My review of labs, imaging, notes and other tests is significant for Normal electrolytes and renal function, normal hemogram     Time spent: 25 minutes [facility] 07/29/2021, 2:29 PM

## 2021-07-29 NOTE — Progress Notes (Signed)
Progress Note     Subjective: Almost 2L out of NG yesterday/overnight. Abdomen feels less bloated and no nausea or emesis. Passing flatus. No BM.   Objective: Vital signs in last 24 hours: Temp:  [98.1 F (36.7 C)-99.8 F (37.7 C)] 98.1 F (36.7 C) (10/05 0518) Pulse Rate:  [75-94] 75 (10/05 0518) Resp:  [17-18] 18 (10/05 0518) BP: (135-154)/(77-84) 135/77 (10/05 0518) SpO2:  [94 %-98 %] 96 % (10/05 0518) Weight:  [102.9 kg] 102.9 kg (10/04 1033) Last BM Date: 07/27/21  Intake/Output from previous day: 10/04 0701 - 10/05 0700 In: 2344 [I.V.:1997; IV Piggyback:347] Out: 3900 [Urine:1700; Emesis/NG OVFIEP:3295] Intake/Output this shift: No intake/output data recorded.  PE: General: pleasant, WD, male who is sitting up in bed in NAD HEENT: head is normocephalic, atraumatic. Mouth is pink and moist Heart: regular, rate, and rhythm. Palpable radial  pulses bilaterally Lungs: CTAB.  Respiratory effort nonlabored Abd: soft, NT, mildly distended, hypoactive BS. NGT without dark bilious output MSK: all 4 extremities are symmetrical with no cyanosis, clubbing, or edema. No calf TTP bilaterally Skin: warm and dry with no masses, lesions, or rashes Psych: A&Ox3 with an appropriate affect.    Lab Results:  Recent Labs    07/27/21 1252 07/28/21 0537  WBC 10.7* 8.6  HGB 14.6 14.0  HCT 45.1 42.7  PLT 236 213   BMET Recent Labs    07/27/21 1252 07/28/21 0537  NA 135 139  K 4.6 4.1  CL 95* 101  CO2 30 27  GLUCOSE 126* 151*  BUN 24* 29*  CREATININE 1.12 1.00  CALCIUM 9.5 9.4   PT/INR No results for input(s): LABPROT, INR in the last 72 hours. CMP     Component Value Date/Time   NA 139 07/28/2021 0537   K 4.1 07/28/2021 0537   CL 101 07/28/2021 0537   CO2 27 07/28/2021 0537   GLUCOSE 151 (H) 07/28/2021 0537   BUN 29 (H) 07/28/2021 0537   CREATININE 1.00 07/28/2021 0537   CALCIUM 9.4 07/28/2021 0537   PROT 7.6 07/27/2021 1252   ALBUMIN 4.1 07/27/2021 1252    AST 16 07/27/2021 1252   ALT 19 07/27/2021 1252   ALKPHOS 51 07/27/2021 1252   BILITOT 0.9 07/27/2021 1252   GFRNONAA >60 07/28/2021 0537   GFRAA >60 08/21/2019 1126   Lipase     Component Value Date/Time   LIPASE 24 07/25/2021 0854       Studies/Results: CT Abdomen Pelvis W Contrast  Result Date: 07/28/2021 CLINICAL DATA:  Nausea, vomiting and weakness. EXAM: CT ABDOMEN AND PELVIS WITH CONTRAST TECHNIQUE: Multidetector CT imaging of the abdomen and pelvis was performed using the standard protocol following bolus administration of intravenous contrast. CONTRAST:  25mL OMNIPAQUE IOHEXOL 350 MG/ML SOLN COMPARISON:  July 25, 2021 FINDINGS: Lower chest: Mild to moderate severity areas of atelectasis are noted within the posterior aspect of the bilateral lung bases. Hepatobiliary: No focal liver abnormality is seen. No gallstones, gallbladder wall thickening, or biliary dilatation. Heterogeneous increased attenuation is seen within the dependent portion of the gallbladder lumen. Pancreas: Unremarkable. No pancreatic ductal dilatation or surrounding inflammatory changes. Spleen: Normal in size without focal abnormality. Adrenals/Urinary Tract: Adrenal glands are unremarkable. Kidneys are normal, without renal calculi, focal lesion, or hydronephrosis. Bladder is unremarkable. Stomach/Bowel: Stomach is within normal limits. Numerous dilated small bowel loops are seen within the abdomen and pelvis. A transition zone is noted within the left lower quadrant/left hemipelvis (axial CT images 55 through 62, CT series 2).  This is along the expected region of the proximal ileum. Noninflamed diverticula are noted within the descending and sigmoid colon. Vascular/Lymphatic: No significant vascular findings are present. No enlarged abdominal or pelvic lymph nodes. 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 Other: A very small fat containing umbilical hernia is noted. No abdominopelvic  ascites. Musculoskeletal: Degenerative changes are seen within the lumbar spine. IMPRESSION: 1. Small-bowel obstruction with a transition zone noted within the left lower quadrant/left hemipelvis. 2. Colonic diverticulosis. 3. Heterogeneous increased attenuation within the gallbladder lumen which may represent retained contrast versus gallbladder sludge. Electronically Signed   By: Virgina Norfolk M.D.   On: 07/28/2021 01:16   DG Abd Acute W/Chest  Result Date: 07/28/2021 CLINICAL DATA:  Nausea and vomiting with generalized weakness. EXAM: DG ABDOMEN ACUTE WITH 1 VIEW CHEST COMPARISON:  None. FINDINGS: Multiple dilated small bowel loops are seen throughout the abdomen. There is no evidence of free air. Radiopaque contrast is seen throughout portions of the large bowel which is normal in caliber. No radiopaque calculi are seen. Heart size and mediastinal contours are within normal limits. A trace amount of atelectasis is seen within the left lung base. IMPRESSION: 1. Findings consistent with a partial small bowel obstruction versus ileus. 2. Trace amount of left basilar atelectasis. Electronically Signed   By: Virgina Norfolk M.D.   On: 07/28/2021 00:24   DG Abd Portable 1V  Result Date: 07/28/2021 CLINICAL DATA:  NG tube placement EXAM: PORTABLE ABDOMEN - 1 VIEW COMPARISON:  Same day radiograph FINDINGS: Nasogastric tube tip and side port overlie the stomach. Persistent multiple dilated loops of small bowel. No acute osseous abnormality. Bibasilar atelectasis. IMPRESSION: Nasogastric tube tip and side port overlie the stomach. Small bowel obstruction. Electronically Signed   By: Maurine Simmering M.D.   On: 07/28/2021 13:14    Anti-infectives: Anti-infectives (From admission, onward)    Start     Dose/Rate Route Frequency Ordered Stop   07/29/21 0000  cefTRIAXone (ROCEPHIN) 2 g in sodium chloride 0.9 % 100 mL IVPB        2 g 200 mL/hr over 30 Minutes Intravenous Every 24 hours 07/28/21 0259      07/28/21 1200  metroNIDAZOLE (FLAGYL) IVPB 500 mg        500 mg 100 mL/hr over 60 Minutes Intravenous Every 12 hours 07/28/21 0259     07/28/21 0000  cefTRIAXone (ROCEPHIN) 2 g in sodium chloride 0.9 % 100 mL IVPB       See Hyperspace for full Linked Orders Report.   2 g 200 mL/hr over 30 Minutes Intravenous  Once 07/27/21 2355 07/28/21 0141   07/28/21 0000  metroNIDAZOLE (FLAGYL) IVPB 500 mg       See Hyperspace for full Linked Orders Report.   500 mg 100 mL/hr over 60 Minutes Intravenous  Once 07/27/21 2355 07/28/21 0122        Assessment/Plan  Diverticulitis SBO w/o h/o prior abd sx - continue abx for diverticulitis - WBC normalized and no pain on exam  - CT showing SBO with transition point in LLQ, possible gallbladder sludge.  - NG placed yesterday with high output. start protocol this am - continue bowel rest - Keep K > 4 and Mg > 2 for bowel function - Mobilize for bowel function - Hopefully patient will improve with conservative management. If patient fails to improve with conservative management, he may require exploratory surgery during admission   FEN: NPO, NGT ID: ceftriaxone/metronidazole VTE: lovenox   HTN  LOS: 1 day    [facility] 07/29/2021, 8:15 AM Please see Amion for pager number during day hours 7:00am-4:30pm

## 2021-07-29 NOTE — Assessment & Plan Note (Signed)
Not on meds at home

## 2021-07-29 NOTE — Assessment & Plan Note (Signed)
NG tube placed in the ER  - Small bowel obstruction x-ray Gastrografin protocol -Consult surgery, appreciate cares - Zofran for nausea, morphine for pain -IV fluids - N.p.o.

## 2021-07-29 NOTE — Assessment & Plan Note (Signed)
No fever - Continue Rocephin and Flagyl

## 2021-07-30 ENCOUNTER — Inpatient Hospital Stay (HOSPITAL_COMMUNITY): Payer: BC Managed Care – PPO

## 2021-07-30 DIAGNOSIS — K56609 Unspecified intestinal obstruction, unspecified as to partial versus complete obstruction: Secondary | ICD-10-CM | POA: Diagnosis not present

## 2021-07-30 DIAGNOSIS — K5792 Diverticulitis of intestine, part unspecified, without perforation or abscess without bleeding: Secondary | ICD-10-CM | POA: Diagnosis not present

## 2021-07-30 DIAGNOSIS — I10 Essential (primary) hypertension: Secondary | ICD-10-CM | POA: Diagnosis not present

## 2021-07-30 LAB — BASIC METABOLIC PANEL
Anion gap: 6 (ref 5–15)
BUN: 24 mg/dL — ABNORMAL HIGH (ref 8–23)
CO2: 30 mmol/L (ref 22–32)
Calcium: 8.7 mg/dL — ABNORMAL LOW (ref 8.9–10.3)
Chloride: 104 mmol/L (ref 98–111)
Creatinine, Ser: 0.91 mg/dL (ref 0.61–1.24)
GFR, Estimated: >60 mL/min (ref >60)
Glucose, Bld: 104 mg/dL — ABNORMAL HIGH (ref 70–99)
Potassium: 4.2 mmol/L (ref 3.5–5.1)
Sodium: 140 mmol/L (ref 135–145)

## 2021-07-30 LAB — CBC
HCT: 39.6 % (ref 39.0–52.0)
Hemoglobin: 12.8 g/dL — ABNORMAL LOW (ref 13.0–17.0)
MCH: 27.8 pg (ref 26.0–34.0)
MCHC: 32.3 g/dL (ref 30.0–36.0)
MCV: 85.9 fL (ref 80.0–100.0)
Platelets: 204 10*3/uL (ref 150–400)
RBC: 4.61 MIL/uL (ref 4.22–5.81)
RDW: 14.2 % (ref 11.5–15.5)
WBC: 7.4 10*3/uL (ref 4.0–10.5)
nRBC: 0 % (ref 0.0–0.2)

## 2021-07-30 MED ORDER — OXYCODONE HCL 5 MG PO TABS
5.0000 mg | ORAL_TABLET | Freq: Four times a day (QID) | ORAL | Status: DC | PRN
Start: 2021-07-30 — End: 2021-08-04
  Filled 2021-07-30: qty 1

## 2021-07-30 NOTE — Progress Notes (Signed)
Progress Note     Subjective: NG output slowing down.  Passing flatus.   Objective: Vital signs in last 24 hours: Temp:  [98.5 F (36.9 C)-100.3 F (37.9 C)] 98.6 F (37 C) (10/06 0510) Pulse Rate:  [74-86] 74 (10/06 0510) Resp:  [18] 18 (10/06 0510) BP: (136-162)/(87-95) 162/93 (10/06 0510) SpO2:  [96 %-100 %] 96 % (10/06 0510) Last BM Date: 07/27/21  Intake/Output from previous day: 10/05 0701 - 10/06 0700 In: 2243 [P.O.:120; I.V.:2018; IV Piggyback:105] Out: 2075 [Urine:1525; Emesis/NG output:550] Intake/Output this shift: No intake/output data recorded.  PE: General: pleasant male who is sitting up in bed in NAD Abd: soft, NT, mildly distended, hypoactive BS. NGT with min dark bilious output MSK: all 4 extremities are symmetrical with no cyanosis, clubbing, or edema. No calf TTP bilaterally Skin: warm and dry with no masses, lesions, or rashes Psych: A&Ox3 with an appropriate affect.    Lab Results:  Recent Labs    07/28/21 0537 07/30/21 0441  WBC 8.6 7.4  HGB 14.0 12.8*  HCT 42.7 39.6  PLT 213 204    BMET Recent Labs    07/28/21 0537 07/30/21 0441  NA 139 140  K 4.1 4.2  CL 101 104  CO2 27 30  GLUCOSE 151* 104*  BUN 29* 24*  CREATININE 1.00 0.91  CALCIUM 9.4 8.7*    PT/INR No results for input(s): LABPROT, INR in the last 72 hours. CMP     Component Value Date/Time   NA 140 07/30/2021 0441   K 4.2 07/30/2021 0441   CL 104 07/30/2021 0441   CO2 30 07/30/2021 0441   GLUCOSE 104 (H) 07/30/2021 0441   BUN 24 (H) 07/30/2021 0441   CREATININE 0.91 07/30/2021 0441   CALCIUM 8.7 (L) 07/30/2021 0441   PROT 7.6 07/27/2021 1252   ALBUMIN 4.1 07/27/2021 1252   AST 16 07/27/2021 1252   ALT 19 07/27/2021 1252   ALKPHOS 51 07/27/2021 1252   BILITOT 0.9 07/27/2021 1252   GFRNONAA >60 07/30/2021 0441   GFRAA >60 08/21/2019 1126   Lipase     Component Value Date/Time   LIPASE 24 07/25/2021 0854       Studies/Results: DG Abd Portable  1V-Small Bowel Obstruction Protocol-initial, 8 hr delay  Result Date: 07/29/2021 CLINICAL DATA:  8 hour delay SBO EXAM: PORTABLE ABDOMEN - 1 VIEW COMPARISON:  07/28/2021, CT 07/28/2021 FINDINGS: Esophageal tube tip and side port overlie the proximal stomach. Small amount of contrast in the gastric fundus. Persistent dilatation of small bowel measuring up to 4.1 cm, slightly increased and consistent with bowel obstruction. There is contrast in the colon but this was present on the radiograph from yesterday and likely reflects residual contrast from CT performed on October 1st. IMPRESSION: 1. Persistent/slight worsening of small bowel distension consistent with obstruction 2. There is contrast in the colon, however this is felt to be from the previously performed CT on October 1st. Electronically Signed   By: Donavan Foil M.D.   On: 07/29/2021 18:28   DG Abd Portable 1V  Result Date: 07/28/2021 CLINICAL DATA:  NG tube placement EXAM: PORTABLE ABDOMEN - 1 VIEW COMPARISON:  Same day radiograph FINDINGS: Nasogastric tube tip and side port overlie the stomach. Persistent multiple dilated loops of small bowel. No acute osseous abnormality. Bibasilar atelectasis. IMPRESSION: Nasogastric tube tip and side port overlie the stomach. Small bowel obstruction. Electronically Signed   By: Maurine Simmering M.D.   On: 07/28/2021 13:14    Anti-infectives: Anti-infectives (  From admission, onward)    Start     Dose/Rate Route Frequency Ordered Stop   07/29/21 0000  cefTRIAXone (ROCEPHIN) 2 g in sodium chloride 0.9 % 100 mL IVPB        2 g 200 mL/hr over 30 Minutes Intravenous Every 24 hours 07/28/21 0259     07/28/21 1200  metroNIDAZOLE (FLAGYL) IVPB 500 mg        500 mg 100 mL/hr over 60 Minutes Intravenous Every 12 hours 07/28/21 0259     07/28/21 0000  cefTRIAXone (ROCEPHIN) 2 g in sodium chloride 0.9 % 100 mL IVPB       See Hyperspace for full Linked Orders Report.   2 g 200 mL/hr over 30 Minutes Intravenous  Once  07/27/21 2355 07/28/21 0141   07/28/21 0000  metroNIDAZOLE (FLAGYL) IVPB 500 mg       See Hyperspace for full Linked Orders Report.   500 mg 100 mL/hr over 60 Minutes Intravenous  Once 07/27/21 2355 07/28/21 0122        Assessment/Plan  Diverticulitis SBO w/o h/o prior abd sx - continue abx for diverticulitis - WBC normalized and no pain on exam  - CT showing SBO with transition point in LLQ, possible gallbladder sludge.  - NG in place with min output now - continue bowel rest, will clamp NG today and d/c this afternoon if pt continues to improve - Keep K > 4 and Mg > 2 for bowel function - Ambulate for bowel function - patient appears to be improving   FEN: NPO, NGT ID: ceftriaxone/metronidazole VTE: lovenox   HTN     LOS: 2 days    Rosario Adie, MD [facility] 07/30/2021, 9:20 AM Please see Amion for pager number during day hours 7:00am-4:30pm

## 2021-07-30 NOTE — Assessment & Plan Note (Signed)
NG tube placed in the ER  Symptoms improving patient did have a bowel movement today.  - General surgery have clamped his tube - Continue IV fluids until consistent taking p.o.'s - Continue Zofran for nausea, morphine for pain

## 2021-07-30 NOTE — Progress Notes (Signed)
  Progress Note    Bailee Thall   MBW:[id number]  DOB: [birth year]  DOA: 07/27/2021     2 Date of Service: 07/30/2021    Brief summary: Mr. Leinweber is a 70 y.o. M with hx HTN not on meds, prosCA, remote DVT who presented with 2-3 days progressive abdominal pain and vomiting.    Initially presented 2 days prior to admission to the ER with new onset severe left-sided abdominal pain, and vomiting.  At that time nausea was controlled, CT showed only uncomplicated diverticulitis and he was discharged with antibiotics.  The pain returned so he came back to the ER the next day with worsening abdominal pain and vomiting, CT now showed small bowel obstruction with transition point in the left lower quadrant.      10/5 admitted and NG placed, large amount of output 10/6 had BM, Gen Surg clamped tube       Subjective:  His abdominal pain is mild, he has had no fever, he had 1 bowel movement overnight.  No confusion.  Hospital Problems * SBO (small bowel obstruction) (HCC) NG tube placed in the ER  Symptoms improving patient did have a bowel movement today.  - General surgery have clamped his tube - Continue IV fluids until consistent taking p.o.'s - Continue Zofran for nausea, morphine for pain   Diverticulitis with obstruction (HCC) T-max 100.3.  Symptoms improving. - Continue Rocephin and Flagyl  Essential hypertension  Not on meds at home     Objective Vital signs were reviewed and unremarkable.  Vitals:   07/29/21 1327 07/29/21 2030 07/30/21 0510 07/30/21 1325  BP: 136/87 (!) 154/95 (!) 162/93 (!) 145/81  Pulse: 79 86 74 72  Resp: 18 18 18 18   Temp: 98.5 F (36.9 C) 100.3 F (37.9 C) 98.6 F (37 C) 98.8 F (37.1 C)  TempSrc: Oral Oral Oral Oral  SpO2: 96% 100% 96% 96%  Weight:      Height:       102.9 kg  Exam General appearance: Adult male, lying in bed, no acute distress     HEENT:    Skin:  Cardiac: RRR, no murmurs, no lower extremity edema, JVP  normal, Respiratory: Tory rate and rhythm, lungs clear to rales or wheezes Abdomen: Mild tenderness in the left lower quadrant, no rebound, no rigidity, no guarding.  No hepatosplenomegaly. MSK: Normal muscle bulk and tone Neuro: Awake and alert, extraocular movements intact, moves all extremities with generalized weakness but symmetric strength Psych: Attention normal, affect appropriate, judgment insight appear normal     Labs / Other Information My review of labs, imaging, notes and other tests is significant for Normal metabolic panel, renal function and hemogram x-rays show persistent bowel obstruction, some Gastrografin in the colon, possibly from the prior CT, possibly from resolution of the bowel obstruction.     Time spent: 25 minutes [facility] 07/30/2021, 8:20 PM

## 2021-07-30 NOTE — Assessment & Plan Note (Signed)
T-max 100.3.  Symptoms improving. - Continue Rocephin and Flagyl

## 2021-07-30 NOTE — Assessment & Plan Note (Signed)
Not on meds at home

## 2021-07-31 ENCOUNTER — Inpatient Hospital Stay (HOSPITAL_COMMUNITY): Payer: BC Managed Care – PPO

## 2021-07-31 DIAGNOSIS — K56609 Unspecified intestinal obstruction, unspecified as to partial versus complete obstruction: Secondary | ICD-10-CM | POA: Diagnosis not present

## 2021-07-31 DIAGNOSIS — I10 Essential (primary) hypertension: Secondary | ICD-10-CM | POA: Diagnosis not present

## 2021-07-31 DIAGNOSIS — K5792 Diverticulitis of intestine, part unspecified, without perforation or abscess without bleeding: Secondary | ICD-10-CM | POA: Diagnosis not present

## 2021-07-31 MED ORDER — SODIUM CHLORIDE 0.9 % IV SOLN: INTRAVENOUS | Status: DC

## 2021-07-31 NOTE — Progress Notes (Addendum)
Progress Note     Subjective: Pt states he vomiting after drinking clears last night.  Feels better this am.     Objective: Vital signs in last 24 hours: Temp:  [98.7 F (37.1 C)-98.8 F (37.1 C)] 98.7 F (37.1 C) (10/06 2055) Pulse Rate:  [65-74] 65 (10/07 0502) Resp:  [16-18] 16 (10/07 0502) BP: (136-161)/(75-89) 136/75 (10/07 0502) SpO2:  [95 %-96 %] 95 % (10/07 0502) Last BM Date: 07/30/21  Intake/Output from previous day: 10/06 0701 - 10/07 0700 In: 2020.4 [P.O.:120; I.V.:1450; IV Piggyback:450.4] Out: 650 [Urine:650] Intake/Output this shift: No intake/output data recorded.  PE: General: pleasant male who is sitting up in bed in NAD Abd: soft, NT, min distended Skin: warm and dry with no masses, lesions, or rashes Psych: A&Ox3 with an appropriate affect.    Lab Results:  Recent Labs    07/30/21 0441  WBC 7.4  HGB 12.8*  HCT 39.6  PLT 204    BMET Recent Labs    07/30/21 0441  NA 140  K 4.2  CL 104  CO2 30  GLUCOSE 104*  BUN 24*  CREATININE 0.91  CALCIUM 8.7*    PT/INR No results for input(s): LABPROT, INR in the last 72 hours. CMP     Component Value Date/Time   NA 140 07/30/2021 0441   K 4.2 07/30/2021 0441   CL 104 07/30/2021 0441   CO2 30 07/30/2021 0441   GLUCOSE 104 (H) 07/30/2021 0441   BUN 24 (H) 07/30/2021 0441   CREATININE 0.91 07/30/2021 0441   CALCIUM 8.7 (L) 07/30/2021 0441   PROT 7.6 07/27/2021 1252   ALBUMIN 4.1 07/27/2021 1252   AST 16 07/27/2021 1252   ALT 19 07/27/2021 1252   ALKPHOS 51 07/27/2021 1252   BILITOT 0.9 07/27/2021 1252   GFRNONAA >60 07/30/2021 0441   GFRAA >60 08/21/2019 1126   Lipase     Component Value Date/Time   LIPASE 24 07/25/2021 0854       Studies/Results: DG Abd Portable 1V  Result Date: 07/30/2021 CLINICAL DATA:  Abdominal pain and discomfort. Possible small bowel obstruction. EXAM: PORTABLE ABDOMEN - 1 VIEW COMPARISON:  Radiography yesterday.  CT 07/28/2021. FINDINGS:  Nasogastric tube in the stomach. Slightly less small bowel distension, but with persistent dilated fluid and air-filled loops consistent with a degree of ongoing small bowel obstruction. Previously administered contrast has reached the colon. IMPRESSION: Evidence of continued partial small bowel obstruction. There may be slightly less intestinal gas than was seen yesterday. Electronically Signed   By: Nelson Chimes M.D.   On: 07/30/2021 13:10   DG Abd Portable 1V-Small Bowel Obstruction Protocol-initial, 8 hr delay  Result Date: 07/29/2021 CLINICAL DATA:  8 hour delay SBO EXAM: PORTABLE ABDOMEN - 1 VIEW COMPARISON:  07/28/2021, CT 07/28/2021 FINDINGS: Esophageal tube tip and side port overlie the proximal stomach. Small amount of contrast in the gastric fundus. Persistent dilatation of small bowel measuring up to 4.1 cm, slightly increased and consistent with bowel obstruction. There is contrast in the colon but this was present on the radiograph from yesterday and likely reflects residual contrast from CT performed on October 1st. IMPRESSION: 1. Persistent/slight worsening of small bowel distension consistent with obstruction 2. There is contrast in the colon, however this is felt to be from the previously performed CT on October 1st. Electronically Signed   By: Donavan Foil M.D.   On: 07/29/2021 18:28    Anti-infectives: Anti-infectives (From admission, onward)    Start  Dose/Rate Route Frequency Ordered Stop   07/29/21 0000  cefTRIAXone (ROCEPHIN) 2 g in sodium chloride 0.9 % 100 mL IVPB        2 g 200 mL/hr over 30 Minutes Intravenous Every 24 hours 07/28/21 0259     07/28/21 1200  metroNIDAZOLE (FLAGYL) IVPB 500 mg        500 mg 100 mL/hr over 60 Minutes Intravenous Every 12 hours 07/28/21 0259     07/28/21 0000  cefTRIAXone (ROCEPHIN) 2 g in sodium chloride 0.9 % 100 mL IVPB       See Hyperspace for full Linked Orders Report.   2 g 200 mL/hr over 30 Minutes Intravenous  Once 07/27/21 2355  07/28/21 0141   07/28/21 0000  metroNIDAZOLE (FLAGYL) IVPB 500 mg       See Hyperspace for full Linked Orders Report.   500 mg 100 mL/hr over 60 Minutes Intravenous  Once 07/27/21 2355 07/28/21 0122        Assessment/Plan  Diverticulitis SBO w/o h/o prior abd sx - continue abx for diverticulitis - WBC normalized and no pain on exam  - CT shows SBO with transition point in LLQ, possible gallbladder sludge.  - NG out but didn't tolerate clears despite having multiple BM's - AXR this AM reviewed, appears to be improving, cont clears as tolerated - Keep K > 4 and Mg > 2 for bowel function - Ambulate for bowel function    FEN: clears ID: ceftriaxone/metronidazole VTE: lovenox   HTN     LOS: 3 days    Rosario Adie, MD [facility] 07/31/2021, 8:11 AM Please see Amion for pager number during day hours 7:00am-4:30pm

## 2021-07-31 NOTE — Progress Notes (Signed)
Patient ID: Glen Freeman, male   DOB: 1951-09-02, 70 y.o.   MRN: [id number]  PROGRESS NOTE    Climmie Buelow  OEU:[id number] DOB: 1951/04/29 DOA: 07/27/2021 PCP: Velna Hatchet, MD   Brief Narrative:  70 year old male with history of hypertension not on meds, breast cancer, remote DVT presented with progressive liver worsening abdominal pain and vomiting.  He had presented 2 days prior to admission to the ED with abdominal pain and vomiting and CT had showed uncomplicated diverticulitis for which she was discharged with antibiotics.  He returned to the ED with worsening abdominal pain and vomiting.  CT now showed small bowel obstruction with transition point in the left lower quadrant.  General surgery was consulted.  Patient was started on IV antibiotics, IV fluids and had NG tube placed.  Assessment & Plan:   Small bowel obstruction -Present on admission.  Treated conservatively with IV fluids, pain management, NGT.  General surgery following.  NG tube was removed on 07/30/2021.  Patient was started on clear liquid diet but patient had multiple episodes of vomiting overnight despite having bowel movements.  Follow abdominal x-ray this morning. -Encourage ambulation.  Recent diagnosis of acute diverticulitis -Continue Rocephin and Flagyl.  Essential hypertension -Blood pressure intermittently elevated.  Not on antihypertensives at home.   DVT prophylaxis: Lovenox Code Status: Full Family Communication: Wife at bedside Disposition Plan: Status is: Inpatient  Remains inpatient appropriate because:Inpatient level of care appropriate due to severity of illness  Dispo: The patient is from: Home              Anticipated d/c is to: Home in 1 to 2 days once clinically improved and cleared by general surgery              Patient currently is not medically stable to d/c.   Difficult to place patient No  Consultants: General surgery  Procedures: None  Antimicrobials:  None   Subjective: Patient seen and examined at bedside.  Had multiple episodes of vomiting and loose bowel movements overnight.  Has not eaten anything this morning.  No overnight fever, worsening shortness of breath reported.  Objective: Vitals:   07/30/21 0510 07/30/21 1325 07/30/21 2055 07/31/21 0502  BP: (!) 162/93 (!) 145/81 (!) 161/89 136/75  Pulse: 74 72 74 65  Resp: 18 18 16 16   Temp: 98.6 F (37 C) 98.8 F (37.1 C) 98.7 F (37.1 C)   TempSrc: Oral Oral Oral   SpO2: 96% 96% 95% 95%  Weight:      Height:        Intake/Output Summary (Last 24 hours) at 07/31/2021 1041 Last data filed at 07/31/2021 1000 Gross per 24 hour  Intake 2020.36 ml  Output 750 ml  Net 1270.36 ml   Filed Weights   07/28/21 1033  Weight: 102.9 kg    Examination:  General exam: Appears calm and comfortable.  On room air. Respiratory system: Bilateral decreased breath sounds at bases Cardiovascular system: S1 & S2 heard, Rate controlled Gastrointestinal system: Abdomen is slightly distended, soft and nontender.  Bowel sounds are sluggish.   Extremities: No cyanosis, clubbing, edema    Data Reviewed: I have personally reviewed following labs and imaging studies  CBC: Recent Labs  Lab 07/25/21 0854 07/26/21 1053 07/27/21 1252 07/28/21 0537 07/30/21 0441  WBC 8.3 11.6* 10.7* 8.6 7.4  NEUTROABS 6.4 10.0* 8.7*  --   --   HGB 13.1 14.4 14.6 14.0 12.8*  HCT 40.6 44.4 45.1 42.7 39.6  MCV 86.2 86.4  86.9 85.4 85.9  PLT 177 223 236 213 675   Basic Metabolic Panel: Recent Labs  Lab 07/25/21 0854 07/26/21 1053 07/27/21 1252 07/28/21 0537 07/30/21 0441  NA 136 135 135 139 140  K 3.8 4.4 4.6 4.1 4.2  CL 104 97* 95* 101 104  CO2 26 28 30 27 30   GLUCOSE 106* 126* 126* 151* 104*  BUN 13 17 24* 29* 24*  CREATININE 1.04 1.13 1.12 1.00 0.91  CALCIUM 8.8* 9.4 9.5 9.4 8.7*   GFR: Estimated Creatinine Clearance: 85.1 mL/min (by C-G formula based on SCr of 0.91 mg/dL). Liver Function  Tests: Recent Labs  Lab 07/25/21 0854 07/27/21 1252  AST 15 16  ALT 23 19  ALKPHOS 56 51  BILITOT 1.4* 0.9  PROT 6.2* 7.6  ALBUMIN 3.5 4.1   Recent Labs  Lab 07/25/21 0854  LIPASE 24   No results for input(s): AMMONIA in the last 168 hours. Coagulation Profile: No results for input(s): INR, PROTIME in the last 168 hours. Cardiac Enzymes: No results for input(s): CKTOTAL, CKMB, CKMBINDEX, TROPONINI in the last 168 hours. BNP (last 3 results) No results for input(s): PROBNP in the last 8760 hours. HbA1C: No results for input(s): HGBA1C in the last 72 hours. CBG: No results for input(s): GLUCAP in the last 168 hours. Lipid Profile: No results for input(s): CHOL, HDL, LDLCALC, TRIG, CHOLHDL, LDLDIRECT in the last 72 hours. Thyroid Function Tests: No results for input(s): TSH, T4TOTAL, FREET4, T3FREE, THYROIDAB in the last 72 hours. Anemia Panel: No results for input(s): VITAMINB12, FOLATE, FERRITIN, TIBC, IRON, RETICCTPCT in the last 72 hours. Sepsis Labs: No results for input(s): PROCALCITON, LATICACIDVEN in the last 168 hours.  Recent Results (from the past 240 hour(s))  Resp Panel by RT-PCR (Flu A&B, Covid) Nasopharyngeal Swab     Status: None   Collection Time: 07/28/21  1:49 AM   Specimen: Nasopharyngeal Swab; Nasopharyngeal(NP) swabs in vial transport medium  Result Value Ref Range Status   SARS Coronavirus 2 by RT PCR NEGATIVE NEGATIVE Final    Comment: (NOTE) SARS-CoV-2 target nucleic acids are NOT DETECTED.  The SARS-CoV-2 RNA is generally detectable in upper respiratory specimens during the acute phase of infection. The lowest concentration of SARS-CoV-2 viral copies this assay can detect is 138 copies/mL. A negative result does not preclude SARS-Cov-2 infection and should not be used as the sole basis for treatment or other patient management decisions. A negative result may occur with  improper specimen collection/handling, submission of specimen  other than nasopharyngeal swab, presence of viral mutation(s) within the areas targeted by this assay, and inadequate number of viral copies(<138 copies/mL). A negative result must be combined with clinical observations, patient history, and epidemiological information. The expected result is Negative.  Fact Sheet for Patients:  EntrepreneurPulse.com.au  Fact Sheet for Healthcare Providers:  IncredibleEmployment.be  This test is no t yet approved or cleared by the [place] FDA and  has been authorized for detection and/or diagnosis of SARS-CoV-2 by FDA under an Emergency Use Authorization (EUA). This EUA will remain  in effect (meaning this test can be used) for the duration of the COVID-19 declaration under Section 564(b)(1) of the Act, 21 U.S.C.section 360bbb-3(b)(1), unless the authorization is terminated  or revoked sooner.       Influenza A by PCR NEGATIVE NEGATIVE Final   Influenza B by PCR NEGATIVE NEGATIVE Final    Comment: (NOTE) The Xpert Xpress SARS-CoV-2/FLU/RSV plus assay is intended as an aid in the diagnosis of  influenza from Nasopharyngeal swab specimens and should not be used as a sole basis for treatment. Nasal washings and aspirates are unacceptable for Xpert Xpress SARS-CoV-2/FLU/RSV testing.  Fact Sheet for Patients: EntrepreneurPulse.com.au  Fact Sheet for Healthcare Providers: IncredibleEmployment.be  This test is not yet approved or cleared by the [place] FDA and has been authorized for detection and/or diagnosis of SARS-CoV-2 by FDA under an Emergency Use Authorization (EUA). This EUA will remain in effect (meaning this test can be used) for the duration of the COVID-19 declaration under Section 564(b)(1) of the Act, 21 U.S.C. section 360bbb-3(b)(1), unless the authorization is terminated or revoked.  Performed at [facility], [place] [address] [place], [place] [address]          Radiology Studies: DG Abd Portable 1V  Result Date: 07/30/2021 CLINICAL DATA:  Abdominal pain and discomfort. Possible small bowel obstruction. EXAM: PORTABLE ABDOMEN - 1 VIEW COMPARISON:  Radiography yesterday.  CT 07/28/2021. FINDINGS: Nasogastric tube in the stomach. Slightly less small bowel distension, but with persistent dilated fluid and air-filled loops consistent with a degree of ongoing small bowel obstruction. Previously administered contrast has reached the colon. IMPRESSION: Evidence of continued partial small bowel obstruction. There may be slightly less intestinal gas than was seen yesterday. Electronically Signed   By: Nelson Chimes M.D.   On: 07/30/2021 13:10   DG Abd Portable 1V-Small Bowel Obstruction Protocol-initial, 8 hr delay  Result Date: 07/29/2021 CLINICAL DATA:  8 hour delay SBO EXAM: PORTABLE ABDOMEN - 1 VIEW COMPARISON:  07/28/2021, CT 07/28/2021 FINDINGS: Esophageal tube tip and side port overlie the proximal stomach. Small amount of contrast in the gastric fundus. Persistent dilatation of small bowel measuring up to 4.1 cm, slightly increased and consistent with bowel obstruction. There is contrast in the colon but this was present on the radiograph from yesterday and likely reflects residual contrast from CT performed on October 1st. IMPRESSION: 1. Persistent/slight worsening of small bowel distension consistent with obstruction 2. There is contrast in the colon, however this is felt to be from the previously performed CT on October 1st. Electronically Signed   By: Donavan Foil M.D.   On: 07/29/2021 18:28        Scheduled Meds:  enoxaparin (LOVENOX) injection  40 mg Subcutaneous Q24H   Continuous Infusions:  cefTRIAXone (ROCEPHIN)  IV 2 g (07/31/21 0110)   metronidazole 500 mg (07/31/21 0008)   promethazine (PHENERGAN) injection (IM or IVPB) 12.5 mg (07/31/21 0105)          Aline August, MD [facility] 07/31/2021, 10:41 AM '

## 2021-08-01 DIAGNOSIS — K56609 Unspecified intestinal obstruction, unspecified as to partial versus complete obstruction: Secondary | ICD-10-CM | POA: Diagnosis not present

## 2021-08-01 DIAGNOSIS — K5792 Diverticulitis of intestine, part unspecified, without perforation or abscess without bleeding: Secondary | ICD-10-CM | POA: Diagnosis not present

## 2021-08-01 DIAGNOSIS — I10 Essential (primary) hypertension: Secondary | ICD-10-CM | POA: Diagnosis not present

## 2021-08-01 LAB — BASIC METABOLIC PANEL
Anion gap: 10 (ref 5–15)
BUN: 18 mg/dL (ref 8–23)
CO2: 24 mmol/L (ref 22–32)
Calcium: 8.6 mg/dL — ABNORMAL LOW (ref 8.9–10.3)
Chloride: 104 mmol/L (ref 98–111)
Creatinine, Ser: 0.94 mg/dL (ref 0.61–1.24)
GFR, Estimated: >60 mL/min (ref >60)
Glucose, Bld: 89 mg/dL (ref 70–99)
Potassium: 3.9 mmol/L (ref 3.5–5.1)
Sodium: 138 mmol/L (ref 135–145)

## 2021-08-01 LAB — CBC WITH DIFFERENTIAL/PLATELET
Abs Immature Granulocytes: 0.11 10*3/uL — ABNORMAL HIGH (ref 0.00–0.07)
Basophils Absolute: 0 10*3/uL (ref 0.0–0.1)
Basophils Relative: 0 %
Eosinophils Absolute: 0.1 10*3/uL (ref 0.0–0.5)
Eosinophils Relative: 1 %
HCT: 40.2 % (ref 39.0–52.0)
Hemoglobin: 12.9 g/dL — ABNORMAL LOW (ref 13.0–17.0)
Immature Granulocytes: 1 %
Lymphocytes Relative: 15 %
Lymphs Abs: 1.4 10*3/uL (ref 0.7–4.0)
MCH: 27.6 pg (ref 26.0–34.0)
MCHC: 32.1 g/dL (ref 30.0–36.0)
MCV: 85.9 fL (ref 80.0–100.0)
Monocytes Absolute: 1 10*3/uL (ref 0.1–1.0)
Monocytes Relative: 11 %
Neutro Abs: 6.4 10*3/uL (ref 1.7–7.7)
Neutrophils Relative %: 72 %
Platelets: 232 10*3/uL (ref 150–400)
RBC: 4.68 MIL/uL (ref 4.22–5.81)
RDW: 14 % (ref 11.5–15.5)
WBC: 9 10*3/uL (ref 4.0–10.5)
nRBC: 0 % (ref 0.0–0.2)

## 2021-08-01 LAB — MAGNESIUM: Magnesium: 2.3 mg/dL (ref 1.7–2.4)

## 2021-08-01 MED ORDER — AMLODIPINE BESYLATE 5 MG PO TABS
5.0000 mg | ORAL_TABLET | Freq: Every day | ORAL | Status: DC
Start: 2021-08-01 — End: 2021-08-04
  Administered 2021-08-01 – 2021-08-04 (×4): 5 mg via ORAL
  Filled 2021-08-01 (×4): qty 1

## 2021-08-01 NOTE — Progress Notes (Signed)
Patient ID: Glen Freeman, male   DOB: 1951/02/11, 70 y.o.   MRN: [id number]  PROGRESS NOTE    Glen Freeman  OVZ:[id number] DOB: 05/29/1951 DOA: 07/27/2021 PCP: Velna Hatchet, MD   Brief Narrative:  70 year old male with history of hypertension not on meds, breast cancer, remote DVT presented with progressive liver worsening abdominal pain and vomiting.  He had presented 2 days prior to admission to the ED with abdominal pain and vomiting and CT had showed uncomplicated diverticulitis for which she was discharged with antibiotics.  He returned to the ED with worsening abdominal pain and vomiting.  CT now showed small bowel obstruction with transition point in the left lower quadrant.  General surgery was consulted.  Patient was started on IV antibiotics, IV fluids and had NG tube placed.  Assessment & Plan:   Small bowel obstruction -Present on admission.  Treated conservatively with IV fluids, pain management, NGT.  General surgery following.  NG tube was removed on 07/30/2021.   -Abdominal x-ray on 07/31/2021 showed persistent partial small bowel obstruction with several loops of dilated small bowel.  Still on clear liquid diet.  Diet advancement as per general surgery. -Encourage ambulation.  Recent diagnosis of acute diverticulitis -Continue Rocephin and Flagyl.  Essential hypertension -Blood pressure intermittently elevated.  Not on antihypertensives at home.   DVT prophylaxis: Lovenox Code Status: Full Family Communication: Wife at bedside on 08/01/2021 Disposition Plan: Status is: Inpatient  Remains inpatient appropriate because:Inpatient level of care appropriate due to severity of illness  Dispo: The patient is from: Home              Anticipated d/c is to: Home in 1 to 2 days once clinically improved and cleared by general surgery              Patient currently is not medically stable to d/c.   Difficult to place patient No  Consultants: General surgery  Procedures:  None  Antimicrobials: None   Subjective: Patient seen and examined at bedside.  Still has some intermittent nausea but no vomiting.  Denies worsening abdominal pain, fever, chest pain or shortness of breath. Had bowel movements yesterday. Objective: Vitals:   07/31/21 0502 07/31/21 1342 07/31/21 2100 08/01/21 0529  BP: 136/75 (!) 148/83 (!) 175/87 (!) 161/92  Pulse: 65 66 (!) 59 72  Resp: 16 18 18 17   Temp:  97.9 F (36.6 C) 98.6 F (37 C) 98.3 F (36.8 C)  TempSrc:  Oral Oral Oral  SpO2: 95% 98% 100% 97%  Weight:      Height:        Intake/Output Summary (Last 24 hours) at 08/01/2021 0733 Last data filed at 08/01/2021 0200 Gross per 24 hour  Intake 1593.93 ml  Output 500 ml  Net 1093.93 ml    Filed Weights   07/28/21 1033  Weight: 102.9 kg    Examination:  General exam: No distress.  Still on room air.   Respiratory system: Decreased breath sounds at bases bilaterally Cardiovascular system: Intermittently bradycardic; S1-S2 heard gastrointestinal system: Abdomen is distended mildly, soft and nontender.  Sluggish bowel sounds  extremities: No edema or clubbing  Data Reviewed: I have personally reviewed following labs and imaging studies  CBC: Recent Labs  Lab 07/25/21 0854 07/26/21 1053 07/27/21 1252 07/28/21 0537 07/30/21 0441 08/01/21 0505  WBC 8.3 11.6* 10.7* 8.6 7.4 9.0  NEUTROABS 6.4 10.0* 8.7*  --   --  6.4  HGB 13.1 14.4 14.6 14.0 12.8* 12.9*  HCT 40.6 44.4  45.1 42.7 39.6 40.2  MCV 86.2 86.4 86.9 85.4 85.9 85.9  PLT 177 223 236 213 204 222    Basic Metabolic Panel: Recent Labs  Lab 07/26/21 1053 07/27/21 1252 07/28/21 0537 07/30/21 0441 08/01/21 0505  NA 135 135 139 140 138  K 4.4 4.6 4.1 4.2 3.9  CL 97* 95* 101 104 104  CO2 28 30 27 30 24   GLUCOSE 126* 126* 151* 104* 89  BUN 17 24* 29* 24* 18  CREATININE 1.13 1.12 1.00 0.91 0.94  CALCIUM 9.4 9.5 9.4 8.7* 8.6*  MG  --   --   --   --  2.3    GFR: Estimated Creatinine Clearance:  82.4 mL/min (by C-G formula based on SCr of 0.94 mg/dL). Liver Function Tests: Recent Labs  Lab 07/25/21 0854 07/27/21 1252  AST 15 16  ALT 23 19  ALKPHOS 56 51  BILITOT 1.4* 0.9  PROT 6.2* 7.6  ALBUMIN 3.5 4.1    Recent Labs  Lab 07/25/21 0854  LIPASE 24    No results for input(s): AMMONIA in the last 168 hours. Coagulation Profile: No results for input(s): INR, PROTIME in the last 168 hours. Cardiac Enzymes: No results for input(s): CKTOTAL, CKMB, CKMBINDEX, TROPONINI in the last 168 hours. BNP (last 3 results) No results for input(s): PROBNP in the last 8760 hours. HbA1C: No results for input(s): HGBA1C in the last 72 hours. CBG: No results for input(s): GLUCAP in the last 168 hours. Lipid Profile: No results for input(s): CHOL, HDL, LDLCALC, TRIG, CHOLHDL, LDLDIRECT in the last 72 hours. Thyroid Function Tests: No results for input(s): TSH, T4TOTAL, FREET4, T3FREE, THYROIDAB in the last 72 hours. Anemia Panel: No results for input(s): VITAMINB12, FOLATE, FERRITIN, TIBC, IRON, RETICCTPCT in the last 72 hours. Sepsis Labs: No results for input(s): PROCALCITON, LATICACIDVEN in the last 168 hours.  Recent Results (from the past 240 hour(s))  Resp Panel by RT-PCR (Flu A&B, Covid) Nasopharyngeal Swab     Status: None   Collection Time: 07/28/21  1:49 AM   Specimen: Nasopharyngeal Swab; Nasopharyngeal(NP) swabs in vial transport medium  Result Value Ref Range Status   SARS Coronavirus 2 by RT PCR NEGATIVE NEGATIVE Final    Comment: (NOTE) SARS-CoV-2 target nucleic acids are NOT DETECTED.  The SARS-CoV-2 RNA is generally detectable in upper respiratory specimens during the acute phase of infection. The lowest concentration of SARS-CoV-2 viral copies this assay can detect is 138 copies/mL. A negative result does not preclude SARS-Cov-2 infection and should not be used as the sole basis for treatment or other patient management decisions. A negative result may occur  with  improper specimen collection/handling, submission of specimen other than nasopharyngeal swab, presence of viral mutation(s) within the areas targeted by this assay, and inadequate number of viral copies(<138 copies/mL). A negative result must be combined with clinical observations, patient history, and epidemiological information. The expected result is Negative.  Fact Sheet for Patients:  EntrepreneurPulse.com.au  Fact Sheet for Healthcare Providers:  IncredibleEmployment.be  This test is no t yet approved or cleared by the [place] FDA and  has been authorized for detection and/or diagnosis of SARS-CoV-2 by FDA under an Emergency Use Authorization (EUA). This EUA will remain  in effect (meaning this test can be used) for the duration of the COVID-19 declaration under Section 564(b)(1) of the Act, 21 U.S.C.section 360bbb-3(b)(1), unless the authorization is terminated  or revoked sooner.       Influenza A by PCR NEGATIVE NEGATIVE Final  Influenza B by PCR NEGATIVE NEGATIVE Final    Comment: (NOTE) The Xpert Xpress SARS-CoV-2/FLU/RSV plus assay is intended as an aid in the diagnosis of influenza from Nasopharyngeal swab specimens and should not be used as a sole basis for treatment. Nasal washings and aspirates are unacceptable for Xpert Xpress SARS-CoV-2/FLU/RSV testing.  Fact Sheet for Patients: EntrepreneurPulse.com.au  Fact Sheet for Healthcare Providers: IncredibleEmployment.be  This test is not yet approved or cleared by the [place] FDA and has been authorized for detection and/or diagnosis of SARS-CoV-2 by FDA under an Emergency Use Authorization (EUA). This EUA will remain in effect (meaning this test can be used) for the duration of the COVID-19 declaration under Section 564(b)(1) of the Act, 21 U.S.C. section 360bbb-3(b)(1), unless the authorization is terminated  or revoked.  Performed at [facility], [place] [address], [place], [place] [address]           Radiology Studies: DG Abd Portable 1V  Result Date: 07/31/2021 CLINICAL DATA:  Small bowel obstruction. EXAM: PORTABLE ABDOMEN - 1 VIEW COMPARISON:  Radiograph 07/30/2021 FINDINGS: Interval removal of enteric tube. Several dilated small bowel loops measuring up to 4.4 cm are visualized in the right and mid abdomen. Residual oral contrast is noted within the transverse and descending colon as well as the rectum. No free air given the limits of these supine images. Prostate brachytherapy seeds. IMPRESSION: Persistent partial small bowel obstruction with several loops of dilated small bowel. Electronically Signed   By: Ileana Roup M.D.   On: 07/31/2021 11:54   DG Abd Portable 1V  Result Date: 07/30/2021 CLINICAL DATA:  Abdominal pain and discomfort. Possible small bowel obstruction. EXAM: PORTABLE ABDOMEN - 1 VIEW COMPARISON:  Radiography yesterday.  CT 07/28/2021. FINDINGS: Nasogastric tube in the stomach. Slightly less small bowel distension, but with persistent dilated fluid and air-filled loops consistent with a degree of ongoing small bowel obstruction. Previously administered contrast has reached the colon. IMPRESSION: Evidence of continued partial small bowel obstruction. There may be slightly less intestinal gas than was seen yesterday. Electronically Signed   By: Nelson Chimes M.D.   On: 07/30/2021 13:10        Scheduled Meds:  enoxaparin (LOVENOX) injection  40 mg Subcutaneous Q24H   Continuous Infusions:  sodium chloride 75 mL/hr at 07/31/21 2346   cefTRIAXone (ROCEPHIN)  IV 2 g (07/31/21 2352)   metronidazole 500 mg (08/01/21 0049)   promethazine (PHENERGAN) injection (IM or IVPB) 12.5 mg (08/01/21 0219)          Aline August, MD [facility] 08/01/2021, 7:33 AM '

## 2021-08-01 NOTE — Progress Notes (Signed)
Progress Note     Subjective: Tolerating liquids. Some nausea but no further emesis.     Objective: Vital signs in last 24 hours: Temp:  [97.9 F (36.6 C)-98.6 F (37 C)] 98.3 F (36.8 C) (10/08 0529) Pulse Rate:  [59-72] 72 (10/08 0529) Resp:  [17-18] 17 (10/08 0529) BP: (148-175)/(83-92) 161/92 (10/08 0529) SpO2:  [97 %-100 %] 97 % (10/08 0529) Last BM Date: 07/31/21  Intake/Output from previous day: 10/07 0701 - 10/08 0700 In: 1593.9 [P.O.:240; I.V.:1003.9; IV Piggyback:350] Out: 500 [Urine:500] Intake/Output this shift: No intake/output data recorded.  PE: General: pleasant male who is sitting up in bed in NAD Abd: soft, NT, min distended Skin: warm and dry with no masses, lesions, or rashes Psych: A&Ox3 with an appropriate affect.    Lab Results:  Recent Labs    07/30/21 0441 08/01/21 0505  WBC 7.4 9.0  HGB 12.8* 12.9*  HCT 39.6 40.2  PLT 204 232    BMET Recent Labs    07/30/21 0441 08/01/21 0505  NA 140 138  K 4.2 3.9  CL 104 104  CO2 30 24  GLUCOSE 104* 89  BUN 24* 18  CREATININE 0.91 0.94  CALCIUM 8.7* 8.6*    PT/INR No results for input(s): LABPROT, INR in the last 72 hours. CMP     Component Value Date/Time   NA 138 08/01/2021 0505   K 3.9 08/01/2021 0505   CL 104 08/01/2021 0505   CO2 24 08/01/2021 0505   GLUCOSE 89 08/01/2021 0505   BUN 18 08/01/2021 0505   CREATININE 0.94 08/01/2021 0505   CALCIUM 8.6 (L) 08/01/2021 0505   PROT 7.6 07/27/2021 1252   ALBUMIN 4.1 07/27/2021 1252   AST 16 07/27/2021 1252   ALT 19 07/27/2021 1252   ALKPHOS 51 07/27/2021 1252   BILITOT 0.9 07/27/2021 1252   GFRNONAA >60 08/01/2021 0505   GFRAA >60 08/21/2019 1126   Lipase     Component Value Date/Time   LIPASE 24 07/25/2021 0854       Studies/Results: DG Abd Portable 1V  Result Date: 07/31/2021 CLINICAL DATA:  Small bowel obstruction. EXAM: PORTABLE ABDOMEN - 1 VIEW COMPARISON:  Radiograph 07/30/2021 FINDINGS: Interval removal of  enteric tube. Several dilated small bowel loops measuring up to 4.4 cm are visualized in the right and mid abdomen. Residual oral contrast is noted within the transverse and descending colon as well as the rectum. No free air given the limits of these supine images. Prostate brachytherapy seeds. IMPRESSION: Persistent partial small bowel obstruction with several loops of dilated small bowel. Electronically Signed   By: Ileana Roup M.D.   On: 07/31/2021 11:54   DG Abd Portable 1V  Result Date: 07/30/2021 CLINICAL DATA:  Abdominal pain and discomfort. Possible small bowel obstruction. EXAM: PORTABLE ABDOMEN - 1 VIEW COMPARISON:  Radiography yesterday.  CT 07/28/2021. FINDINGS: Nasogastric tube in the stomach. Slightly less small bowel distension, but with persistent dilated fluid and air-filled loops consistent with a degree of ongoing small bowel obstruction. Previously administered contrast has reached the colon. IMPRESSION: Evidence of continued partial small bowel obstruction. There may be slightly less intestinal gas than was seen yesterday. Electronically Signed   By: Nelson Chimes M.D.   On: 07/30/2021 13:10    Anti-infectives: Anti-infectives (From admission, onward)    Start     Dose/Rate Route Frequency Ordered Stop   07/29/21 0000  cefTRIAXone (ROCEPHIN) 2 g in sodium chloride 0.9 % 100 mL IVPB  2 g 200 mL/hr over 30 Minutes Intravenous Every 24 hours 07/28/21 0259     07/28/21 1200  metroNIDAZOLE (FLAGYL) IVPB 500 mg        500 mg 100 mL/hr over 60 Minutes Intravenous Every 12 hours 07/28/21 0259     07/28/21 0000  cefTRIAXone (ROCEPHIN) 2 g in sodium chloride 0.9 % 100 mL IVPB       See Hyperspace for full Linked Orders Report.   2 g 200 mL/hr over 30 Minutes Intravenous  Once 07/27/21 2355 07/28/21 0141   07/28/21 0000  metroNIDAZOLE (FLAGYL) IVPB 500 mg       See Hyperspace for full Linked Orders Report.   500 mg 100 mL/hr over 60 Minutes Intravenous  Once 07/27/21 2355  07/28/21 0122        Assessment/Plan  Diverticulitis SBO w/o h/o prior abd sx - continue abx for diverticulitis - WBC normalized and no pain on exam  - CT shows SBO with transition point in LLQ, possible gallbladder sludge.  - Multiple Bms, continue cld as tolerated - Keep K > 4 and Mg > 2 for bowel function - Ambulate for bowel function    FEN: clears ID: ceftriaxone/metronidazole VTE: lovenox   HTN     LOS: 4 days    Clovis Riley, MD [facility] 08/01/2021, 8:08 AM Please see Amion for pager number during day hours 7:00am-4:30pm

## 2021-08-02 DIAGNOSIS — K56609 Unspecified intestinal obstruction, unspecified as to partial versus complete obstruction: Secondary | ICD-10-CM | POA: Diagnosis not present

## 2021-08-02 DIAGNOSIS — I10 Essential (primary) hypertension: Secondary | ICD-10-CM | POA: Diagnosis not present

## 2021-08-02 DIAGNOSIS — K5792 Diverticulitis of intestine, part unspecified, without perforation or abscess without bleeding: Secondary | ICD-10-CM | POA: Diagnosis not present

## 2021-08-02 MED ORDER — SODIUM CHLORIDE 0.9 % IV SOLN
12.5000 mg | Freq: Four times a day (QID) | INTRAVENOUS | Status: DC | PRN
Start: 2021-08-02 — End: 2021-08-04
  Administered 2021-08-02: 12.5 mg via INTRAVENOUS
  Filled 2021-08-02 (×2): qty 0.5

## 2021-08-02 NOTE — Progress Notes (Signed)
Progress Note     Subjective: Tolerating liquids. Some mild nausea overnight. Having bowel movements, denies pain   Objective: Vital signs in last 24 hours: Temp:  [98.3 F (36.8 C)-99.2 F (37.3 C)] 98.3 F (36.8 C) (10/09 0527) Pulse Rate:  [72-75] 75 (10/09 0527) Resp:  [16-17] 17 (10/09 0527) BP: (145-162)/(88-93) 160/92 (10/09 0527) SpO2:  [97 %-98 %] 98 % (10/09 0527) Last BM Date: 08/01/21  Intake/Output from previous day: 10/08 0701 - 10/09 0700 In: 2745 [P.O.:840; I.V.:1405; IV Piggyback:499.9] Out: -  Intake/Output this shift: Total I/O In: 389 [P.O.:240; I.V.:149] Out: -   PE: General: pleasant male who is sitting up in bed in NAD Abd: soft, NT, min distended Skin: warm and dry with no masses, lesions, or rashes Psych: A&Ox3 with an appropriate affect.    Lab Results:  Recent Labs    08/01/21 0505  WBC 9.0  HGB 12.9*  HCT 40.2  PLT 232    BMET Recent Labs    08/01/21 0505  NA 138  K 3.9  CL 104  CO2 24  GLUCOSE 89  BUN 18  CREATININE 0.94  CALCIUM 8.6*    PT/INR No results for input(s): LABPROT, INR in the last 72 hours. CMP     Component Value Date/Time   NA 138 08/01/2021 0505   K 3.9 08/01/2021 0505   CL 104 08/01/2021 0505   CO2 24 08/01/2021 0505   GLUCOSE 89 08/01/2021 0505   BUN 18 08/01/2021 0505   CREATININE 0.94 08/01/2021 0505   CALCIUM 8.6 (L) 08/01/2021 0505   PROT 7.6 07/27/2021 1252   ALBUMIN 4.1 07/27/2021 1252   AST 16 07/27/2021 1252   ALT 19 07/27/2021 1252   ALKPHOS 51 07/27/2021 1252   BILITOT 0.9 07/27/2021 1252   GFRNONAA >60 08/01/2021 0505   GFRAA >60 08/21/2019 1126   Lipase     Component Value Date/Time   LIPASE 24 07/25/2021 0854       Studies/Results: No results found.  Anti-infectives: Anti-infectives (From admission, onward)    Start     Dose/Rate Route Frequency Ordered Stop   07/29/21 0000  cefTRIAXone (ROCEPHIN) 2 g in sodium chloride 0.9 % 100 mL IVPB        2 g 200  mL/hr over 30 Minutes Intravenous Every 24 hours 07/28/21 0259     07/28/21 1200  metroNIDAZOLE (FLAGYL) IVPB 500 mg        500 mg 100 mL/hr over 60 Minutes Intravenous Every 12 hours 07/28/21 0259     07/28/21 0000  cefTRIAXone (ROCEPHIN) 2 g in sodium chloride 0.9 % 100 mL IVPB       See Hyperspace for full Linked Orders Report.   2 g 200 mL/hr over 30 Minutes Intravenous  Once 07/27/21 2355 07/28/21 0141   07/28/21 0000  metroNIDAZOLE (FLAGYL) IVPB 500 mg       See Hyperspace for full Linked Orders Report.   500 mg 100 mL/hr over 60 Minutes Intravenous  Once 07/27/21 2355 07/28/21 0122        Assessment/Plan  Diverticulitis SBO w/o h/o prior abd sx - continue abx for diverticulitis - WBC normalized and no pain on exam  - CT shows SBO with transition point in LLQ, possible gallbladder sludge.  - Multiple Bms, distention less today, will try soft diet - Keep K > 4 and Mg > 2 for bowel function - Ambulate for bowel function    FEN: clears ID: ceftriaxone/metronidazole VTE: lovenox  HTN  Dispo- possibly home next 24h if tolerating diet     LOS: 5 days    Clovis Riley, MD [facility] 08/02/2021, 10:01 AM Please see Amion for pager number during day hours 7:00am-4:30pm

## 2021-08-02 NOTE — Progress Notes (Signed)
Patient ID: Glen Freeman, male   DOB: 1951/10/21, 70 y.o.   MRN: [id number]  PROGRESS NOTE    Daryan Buell  WUJ:[id number] DOB: 30-Oct-1950 DOA: 07/27/2021 PCP: Velna Hatchet, MD   Brief Narrative:  70 year old male with history of hypertension not on meds, breast cancer, remote DVT presented with progressive liver worsening abdominal pain and vomiting.  He had presented 2 days prior to admission to the ED with abdominal pain and vomiting and CT had showed uncomplicated diverticulitis for which she was discharged with antibiotics.  He returned to the ED with worsening abdominal pain and vomiting.  CT now showed small bowel obstruction with transition point in the left lower quadrant.  General surgery was consulted.  Patient was started on IV antibiotics, IV fluids and had NG tube placed.  Assessment & Plan:   Small bowel obstruction -Present on admission.  Treated conservatively with IV fluids, pain management, NGT.  General surgery following.  NG tube was removed on 07/30/2021.   -Abdominal x-ray on 07/31/2021 showed persistent partial small bowel obstruction with several loops of dilated small bowel.  Still on clear liquid diet.  Diet advancement as per general surgery. -Encourage ambulation.  Recent diagnosis of acute diverticulitis -Continue Rocephin and Flagyl.  Essential hypertension -Blood pressure intermittently elevated.  Not on antihypertensives at home.   DVT prophylaxis: Lovenox Code Status: Full Family Communication: Wife at bedside on 08/02/2021 Disposition Plan: Status is: Inpatient  Remains inpatient appropriate because:Inpatient level of care appropriate due to severity of illness  Dispo: The patient is from: Home              Anticipated d/c is to: Home in 1 to 2 days once clinically improved and cleared by general surgery              Patient currently is not medically stable to d/c.   Difficult to place patient No  Consultants: General surgery  Procedures:  None  Antimicrobials: None   Subjective: Patient seen and examined at bedside.  Denies worsening abdominal pain, chest pain or shortness of breath or fever.  Still intermittently nauseous.  Tolerating clear liquid diet.   Objective: Vitals:   08/01/21 0529 08/01/21 1424 08/01/21 2126 08/02/21 0527  BP: (!) 161/92 (!) 145/88 (!) 162/93 (!) 160/92  Pulse: 72 72 75 75  Resp: 17 16 17 17   Temp: 98.3 F (36.8 C) 99.2 F (37.3 C) 98.5 F (36.9 C) 98.3 F (36.8 C)  TempSrc: Oral     SpO2: 97% 97% 97% 98%  Weight:      Height:        Intake/Output Summary (Last 24 hours) at 08/02/2021 0747 Last data filed at 08/02/2021 0600 Gross per 24 hour  Intake 2744.97 ml  Output --  Net 2744.97 ml    Filed Weights   07/28/21 1033  Weight: 102.9 kg    Examination:  General exam: On room air currently.  No acute distress.   Respiratory system: Bilateral decreased breath sounds at bases cardiovascular system: S1-S2 heard, currently rate controlled gastrointestinal system: Abdomen is slightly distended, soft and nontender.  Bowel sounds are sluggish  extremities: Mild lower extremity edema; no cyanosis   data Reviewed: I have personally reviewed following labs and imaging studies  CBC: Recent Labs  Lab 07/26/21 1053 07/27/21 1252 07/28/21 0537 07/30/21 0441 08/01/21 0505  WBC 11.6* 10.7* 8.6 7.4 9.0  NEUTROABS 10.0* 8.7*  --   --  6.4  HGB 14.4 14.6 14.0 12.8* 12.9*  HCT  44.4 45.1 42.7 39.6 40.2  MCV 86.4 86.9 85.4 85.9 85.9  PLT 223 236 213 204 329    Basic Metabolic Panel: Recent Labs  Lab 07/26/21 1053 07/27/21 1252 07/28/21 0537 07/30/21 0441 08/01/21 0505  NA 135 135 139 140 138  K 4.4 4.6 4.1 4.2 3.9  CL 97* 95* 101 104 104  CO2 28 30 27 30 24   GLUCOSE 126* 126* 151* 104* 89  BUN 17 24* 29* 24* 18  CREATININE 1.13 1.12 1.00 0.91 0.94  CALCIUM 9.4 9.5 9.4 8.7* 8.6*  MG  --   --   --   --  2.3    GFR: Estimated Creatinine Clearance: 82.4 mL/min (by C-G  formula based on SCr of 0.94 mg/dL). Liver Function Tests: Recent Labs  Lab 07/27/21 1252  AST 16  ALT 19  ALKPHOS 51  BILITOT 0.9  PROT 7.6  ALBUMIN 4.1    No results for input(s): LIPASE, AMYLASE in the last 168 hours.  No results for input(s): AMMONIA in the last 168 hours. Coagulation Profile: No results for input(s): INR, PROTIME in the last 168 hours. Cardiac Enzymes: No results for input(s): CKTOTAL, CKMB, CKMBINDEX, TROPONINI in the last 168 hours. BNP (last 3 results) No results for input(s): PROBNP in the last 8760 hours. HbA1C: No results for input(s): HGBA1C in the last 72 hours. CBG: No results for input(s): GLUCAP in the last 168 hours. Lipid Profile: No results for input(s): CHOL, HDL, LDLCALC, TRIG, CHOLHDL, LDLDIRECT in the last 72 hours. Thyroid Function Tests: No results for input(s): TSH, T4TOTAL, FREET4, T3FREE, THYROIDAB in the last 72 hours. Anemia Panel: No results for input(s): VITAMINB12, FOLATE, FERRITIN, TIBC, IRON, RETICCTPCT in the last 72 hours. Sepsis Labs: No results for input(s): PROCALCITON, LATICACIDVEN in the last 168 hours.  Recent Results (from the past 240 hour(s))  Resp Panel by RT-PCR (Flu A&B, Covid) Nasopharyngeal Swab     Status: None   Collection Time: 07/28/21  1:49 AM   Specimen: Nasopharyngeal Swab; Nasopharyngeal(NP) swabs in vial transport medium  Result Value Ref Range Status   SARS Coronavirus 2 by RT PCR NEGATIVE NEGATIVE Final    Comment: (NOTE) SARS-CoV-2 target nucleic acids are NOT DETECTED.  The SARS-CoV-2 RNA is generally detectable in upper respiratory specimens during the acute phase of infection. The lowest concentration of SARS-CoV-2 viral copies this assay can detect is 138 copies/mL. A negative result does not preclude SARS-Cov-2 infection and should not be used as the sole basis for treatment or other patient management decisions. A negative result may occur with  improper specimen  collection/handling, submission of specimen other than nasopharyngeal swab, presence of viral mutation(s) within the areas targeted by this assay, and inadequate number of viral copies(<138 copies/mL). A negative result must be combined with clinical observations, patient history, and epidemiological information. The expected result is Negative.  Fact Sheet for Patients:  EntrepreneurPulse.com.au  Fact Sheet for Healthcare Providers:  IncredibleEmployment.be  This test is no t yet approved or cleared by the [place] FDA and  has been authorized for detection and/or diagnosis of SARS-CoV-2 by FDA under an Emergency Use Authorization (EUA). This EUA will remain  in effect (meaning this test can be used) for the duration of the COVID-19 declaration under Section 564(b)(1) of the Act, 21 U.S.C.section 360bbb-3(b)(1), unless the authorization is terminated  or revoked sooner.       Influenza A by PCR NEGATIVE NEGATIVE Final   Influenza B by PCR NEGATIVE NEGATIVE Final  Comment: (NOTE) The Xpert Xpress SARS-CoV-2/FLU/RSV plus assay is intended as an aid in the diagnosis of influenza from Nasopharyngeal swab specimens and should not be used as a sole basis for treatment. Nasal washings and aspirates are unacceptable for Xpert Xpress SARS-CoV-2/FLU/RSV testing.  Fact Sheet for Patients: EntrepreneurPulse.com.au  Fact Sheet for Healthcare Providers: IncredibleEmployment.be  This test is not yet approved or cleared by the [place] FDA and has been authorized for detection and/or diagnosis of SARS-CoV-2 by FDA under an Emergency Use Authorization (EUA). This EUA will remain in effect (meaning this test can be used) for the duration of the COVID-19 declaration under Section 564(b)(1) of the Act, 21 U.S.C. section 360bbb-3(b)(1), unless the authorization is terminated or revoked.  Performed at [facility], [place] [address], [place], [place] [address]           Radiology Studies: DG Abd Portable 1V  Result Date: 07/31/2021 CLINICAL DATA:  Small bowel obstruction. EXAM: PORTABLE ABDOMEN - 1 VIEW COMPARISON:  Radiograph 07/30/2021 FINDINGS: Interval removal of enteric tube. Several dilated small bowel loops measuring up to 4.4 cm are visualized in the right and mid abdomen. Residual oral contrast is noted within the transverse and descending colon as well as the rectum. No free air given the limits of these supine images. Prostate brachytherapy seeds. IMPRESSION: Persistent partial small bowel obstruction with several loops of dilated small bowel. Electronically Signed   By: Ileana Roup M.D.   On: 07/31/2021 11:54        Scheduled Meds:  amLODipine  5 mg Oral Daily   enoxaparin (LOVENOX) injection  40 mg Subcutaneous Q24H   Continuous Infusions:  sodium chloride 50 mL/hr at 08/01/21 2007   cefTRIAXone (ROCEPHIN)  IV 2 g (08/01/21 2333)   metronidazole 500 mg (08/02/21 0122)   promethazine (PHENERGAN) injection (IM or IVPB) 12.5 mg (08/02/21 0100)          Aline August, MD [facility] 08/02/2021, 7:47 AM '

## 2021-08-03 DIAGNOSIS — I10 Essential (primary) hypertension: Secondary | ICD-10-CM | POA: Diagnosis not present

## 2021-08-03 DIAGNOSIS — K5792 Diverticulitis of intestine, part unspecified, without perforation or abscess without bleeding: Secondary | ICD-10-CM | POA: Diagnosis not present

## 2021-08-03 DIAGNOSIS — K56609 Unspecified intestinal obstruction, unspecified as to partial versus complete obstruction: Secondary | ICD-10-CM | POA: Diagnosis not present

## 2021-08-03 MED ORDER — PANTOPRAZOLE SODIUM 40 MG IV SOLR
40.0000 mg | Freq: Two times a day (BID) | INTRAVENOUS | Status: DC
  Administered 2021-08-03 – 2021-08-04 (×2): 40 mg via INTRAVENOUS
  Filled 2021-08-03 (×2): qty 40

## 2021-08-03 MED ORDER — PANTOPRAZOLE SODIUM 40 MG PO TBEC: 40.0000 mg | DELAYED_RELEASE_TABLET | Freq: Every day | ORAL | Status: DC

## 2021-08-03 MED ORDER — PANTOPRAZOLE SODIUM 40 MG IV SOLR
40.0000 mg | INTRAVENOUS | Status: DC
  Administered 2021-08-03: 40 mg via INTRAVENOUS
  Filled 2021-08-03: qty 40

## 2021-08-03 MED ORDER — ALUM & MAG HYDROXIDE-SIMETH 200-200-20 MG/5ML PO SUSP: 15.0000 mL | ORAL | Status: DC | PRN

## 2021-08-03 NOTE — Progress Notes (Signed)
Patient ID: Glen Freeman, male   DOB: 09-01-1951, 70 y.o.   MRN: [id number] [facility] Surgery Progress Note     Subjective: CC-  Up in chair. Denies abdominal pain. Still having some n/v, worse at night and when lying down. Feels like acid reflux. Tolerating diet and having bowel movements.  Objective: Vital signs in last 24 hours: Temp:  [98.7 F (37.1 C)-99.7 F (37.6 C)] 99.2 F (37.3 C) (10/10 0600) Pulse Rate:  [75-95] 95 (10/10 0600) Resp:  [16-18] 18 (10/10 0600) BP: (138-148)/(85-93) 138/92 (10/10 0600) SpO2:  [97 %-100 %] 100 % (10/10 0600) Last BM Date: 08/02/21  Intake/Output from previous day: 10/09 0701 - 10/10 0700 In: 2148.8 [P.O.:1080; I.V.:968.8; IV Piggyback:100] Out: 100 [Emesis/NG output:100] Intake/Output this shift: No intake/output data recorded.  PE: Gen:  Alert, NAD, pleasant Pulm: rate and effort normal Abd: Soft, NT/ND, +BS  Lab Results:  Recent Labs    08/01/21 0505  WBC 9.0  HGB 12.9*  HCT 40.2  PLT 232   BMET Recent Labs    08/01/21 0505  NA 138  K 3.9  CL 104  CO2 24  GLUCOSE 89  BUN 18  CREATININE 0.94  CALCIUM 8.6*   PT/INR No results for input(s): LABPROT, INR in the last 72 hours. CMP     Component Value Date/Time   NA 138 08/01/2021 0505   K 3.9 08/01/2021 0505   CL 104 08/01/2021 0505   CO2 24 08/01/2021 0505   GLUCOSE 89 08/01/2021 0505   BUN 18 08/01/2021 0505   CREATININE 0.94 08/01/2021 0505   CALCIUM 8.6 (L) 08/01/2021 0505   PROT 7.6 07/27/2021 1252   ALBUMIN 4.1 07/27/2021 1252   AST 16 07/27/2021 1252   ALT 19 07/27/2021 1252   ALKPHOS 51 07/27/2021 1252   BILITOT 0.9 07/27/2021 1252   GFRNONAA >60 08/01/2021 0505   GFRAA >60 08/21/2019 1126   Lipase     Component Value Date/Time   LIPASE 24 07/25/2021 0854       Studies/Results: No results found.  Anti-infectives: Anti-infectives (From admission, onward)    Start     Dose/Rate Route Frequency Ordered Stop   07/29/21 0000   cefTRIAXone (ROCEPHIN) 2 g in sodium chloride 0.9 % 100 mL IVPB        2 g 200 mL/hr over 30 Minutes Intravenous Every 24 hours 07/28/21 0259     07/28/21 1200  metroNIDAZOLE (FLAGYL) IVPB 500 mg        500 mg 100 mL/hr over 60 Minutes Intravenous Every 12 hours 07/28/21 0259     07/28/21 0000  cefTRIAXone (ROCEPHIN) 2 g in sodium chloride 0.9 % 100 mL IVPB       See Hyperspace for full Linked Orders Report.   2 g 200 mL/hr over 30 Minutes Intravenous  Once 07/27/21 2355 07/28/21 0141   07/28/21 0000  metroNIDAZOLE (FLAGYL) IVPB 500 mg       See Hyperspace for full Linked Orders Report.   500 mg 100 mL/hr over 60 Minutes Intravenous  Once 07/27/21 2355 07/28/21 0122        Assessment/Plan Diverticulitis SBO w/o h/o prior abd sx - tolerating diet and having bowel function - abdominal pain resolved - WBC normalized 2 days ago and he is afebrile. He completed 7 days of IV abx, likely does not need any more after discharge - he feels like the n/v is reflux - add protonix. If n/v resolves he is ok for discharge from  surgical standpoint   FEN: clears ID: ceftriaxone/metronidazole 10/4>>day#7 VTE: lovenox   HTN   Dispo- possibly home next 24h if tolerating diet   LOS: 6 days    Glen Freeman, [facility] Surgery 08/03/2021, 12:14 PM Please see Amion for pager number during day hours 7:00am-4:30pm

## 2021-08-03 NOTE — Progress Notes (Signed)
Patient vomited once per shift, also complains of hiccoughs, on-call notified and thorazine PRN was order and it was given. Per patient, thorazine was effective. We continue to monitor.

## 2021-08-03 NOTE — Progress Notes (Signed)
Patient ID: Glen Freeman, male   DOB: [birth year], 70 y.o.   MRN: [id number]  PROGRESS NOTE    Glen Freeman  JSH:[id number] DOB: September 09, 1951 DOA: 07/27/2021 PCP: Velna Hatchet, MD   Brief Narrative:  70 year old male with history of hypertension not on meds, breast cancer, remote DVT presented with progressive liver worsening abdominal pain and vomiting.  He had presented 2 days prior to admission to the ED with abdominal pain and vomiting and CT had showed uncomplicated diverticulitis for which she was discharged with antibiotics.  He returned to the ED with worsening abdominal pain and vomiting.  CT now showed small bowel obstruction with transition point in the left lower quadrant.  General surgery was consulted.  Patient was started on IV antibiotics, IV fluids and had NG tube placed.  Assessment & Plan:   Small bowel obstruction -Present on admission.  Treated conservatively with IV fluids, pain management, NGT.  General surgery following.  NG tube was removed on 07/30/2021.   -Currently on soft diet as per general surgery. Had nausea and vomiting this morning.  At IV Protonix and as needed Maalox. -Encourage ambulation.  Recent diagnosis of acute diverticulitis -Continue Rocephin and Flagyl.  Essential hypertension -Blood pressure intermittently elevated.  Not on antihypertensives at home.   DVT prophylaxis: Lovenox Code Status: Full Family Communication: Wife at bedside on 08/03/2021 Disposition Plan: Status is: Inpatient  Remains inpatient appropriate because:Inpatient level of care appropriate due to severity of illness  Dispo: The patient is from: Home              Anticipated d/c is to: Home in 1 to 2 days once clinically improved and tolerates start              Patient currently is not medically stable to d/c.   Difficult to place patient No  Consultants: General surgery  Procedures: None  Antimicrobials: None   Subjective: Patient seen and examined at  bedside.  Had nausea and vomiting this morning.  Feels that he might have acid reflux. No fever, worsening abdominal pain.  Having bowel movements. Objective: Vitals:   08/02/21 1324 08/02/21 2059 08/03/21 0600 08/03/21 1257  BP: (!) 143/85 (!) 148/93 (!) 138/92 (!) 153/90  Pulse: 75 87 95 84  Resp: 16 18 18 18   Temp: 98.7 F (37.1 C) 99.7 F (37.6 C) 99.2 F (37.3 C) 99.6 F (37.6 C)  TempSrc: Oral Oral Oral Oral  SpO2: 99% 97% 100% 92%  Weight:      Height:        Intake/Output Summary (Last 24 hours) at 08/03/2021 1302 Last data filed at 08/03/2021 1000 Gross per 24 hour  Intake 2086.73 ml  Output 100 ml  Net 1986.73 ml    Filed Weights   07/28/21 1033  Weight: 102.9 kg    Examination:  General: Pt is alert, awake, not in acute distress.  Currently on room air. Cardiovascular: rate controlled, S1/S2 + Respiratory: bilateral decreased breath sounds at bases Abdominal: Soft, obese, NT, slightly distended, bowel sounds + Extremities: Trace lower extremity edema; no cyanosis  data Reviewed: I have personally reviewed following labs and imaging studies  CBC: Recent Labs  Lab 07/28/21 0537 07/30/21 0441 08/01/21 0505  WBC 8.6 7.4 9.0  NEUTROABS  --   --  6.4  HGB 14.0 12.8* 12.9*  HCT 42.7 39.6 40.2  MCV 85.4 85.9 85.9  PLT 213 204 850    Basic Metabolic Panel: Recent Labs  Lab 07/28/21 0537 07/30/21 0441  08/01/21 0505  NA 139 140 138  K 4.1 4.2 3.9  CL 101 104 104  CO2 27 30 24   GLUCOSE 151* 104* 89  BUN 29* 24* 18  CREATININE 1.00 0.91 0.94  CALCIUM 9.4 8.7* 8.6*  MG  --   --  2.3    GFR: Estimated Creatinine Clearance: 82.4 mL/min (by C-G formula based on SCr of 0.94 mg/dL). Liver Function Tests: No results for input(s): AST, ALT, ALKPHOS, BILITOT, PROT, ALBUMIN in the last 168 hours.  No results for input(s): LIPASE, AMYLASE in the last 168 hours.  No results for input(s): AMMONIA in the last 168 hours. Coagulation Profile: No results  for input(s): INR, PROTIME in the last 168 hours. Cardiac Enzymes: No results for input(s): CKTOTAL, CKMB, CKMBINDEX, TROPONINI in the last 168 hours. BNP (last 3 results) No results for input(s): PROBNP in the last 8760 hours. HbA1C: No results for input(s): HGBA1C in the last 72 hours. CBG: No results for input(s): GLUCAP in the last 168 hours. Lipid Profile: No results for input(s): CHOL, HDL, LDLCALC, TRIG, CHOLHDL, LDLDIRECT in the last 72 hours. Thyroid Function Tests: No results for input(s): TSH, T4TOTAL, FREET4, T3FREE, THYROIDAB in the last 72 hours. Anemia Panel: No results for input(s): VITAMINB12, FOLATE, FERRITIN, TIBC, IRON, RETICCTPCT in the last 72 hours. Sepsis Labs: No results for input(s): PROCALCITON, LATICACIDVEN in the last 168 hours.  Recent Results (from the past 240 hour(s))  Resp Panel by RT-PCR (Flu A&B, Covid) Nasopharyngeal Swab     Status: None   Collection Time: 07/28/21  1:49 AM   Specimen: Nasopharyngeal Swab; Nasopharyngeal(NP) swabs in vial transport medium  Result Value Ref Range Status   SARS Coronavirus 2 by RT PCR NEGATIVE NEGATIVE Final    Comment: (NOTE) SARS-CoV-2 target nucleic acids are NOT DETECTED.  The SARS-CoV-2 RNA is generally detectable in upper respiratory specimens during the acute phase of infection. The lowest concentration of SARS-CoV-2 viral copies this assay can detect is 138 copies/mL. A negative result does not preclude SARS-Cov-2 infection and should not be used as the sole basis for treatment or other patient management decisions. A negative result may occur with  improper specimen collection/handling, submission of specimen other than nasopharyngeal swab, presence of viral mutation(s) within the areas targeted by this assay, and inadequate number of viral copies(<138 copies/mL). A negative result must be combined with clinical observations, patient history, and epidemiological information. The expected result is  Negative.  Fact Sheet for Patients:  EntrepreneurPulse.com.au  Fact Sheet for Healthcare Providers:  IncredibleEmployment.be  This test is no t yet approved or cleared by the [place] FDA and  has been authorized for detection and/or diagnosis of SARS-CoV-2 by FDA under an Emergency Use Authorization (EUA). This EUA will remain  in effect (meaning this test can be used) for the duration of the COVID-19 declaration under Section 564(b)(1) of the Act, 21 U.S.C.section 360bbb-3(b)(1), unless the authorization is terminated  or revoked sooner.       Influenza A by PCR NEGATIVE NEGATIVE Final   Influenza B by PCR NEGATIVE NEGATIVE Final    Comment: (NOTE) The Xpert Xpress SARS-CoV-2/FLU/RSV plus assay is intended as an aid in the diagnosis of influenza from Nasopharyngeal swab specimens and should not be used as a sole basis for treatment. Nasal washings and aspirates are unacceptable for Xpert Xpress SARS-CoV-2/FLU/RSV testing.  Fact Sheet for Patients: EntrepreneurPulse.com.au  Fact Sheet for Healthcare Providers: IncredibleEmployment.be  This test is not yet approved or cleared by  the [employer] and has been authorized for detection and/or diagnosis of SARS-CoV-2 by FDA under an Emergency Use Authorization (EUA). This EUA will remain in effect (meaning this test can be used) for the duration of the COVID-19 declaration under Section 564(b)(1) of the Act, 21 U.S.C. section 360bbb-3(b)(1), unless the authorization is terminated or revoked.  Performed at [facility], [place] [address], [place], [place] [address]           Radiology Studies: No results found.      Scheduled Meds:  amLODipine  5 mg Oral Daily   enoxaparin (LOVENOX) injection  40 mg Subcutaneous Q24H   [START ON 08/04/2021] pantoprazole  40 mg Oral Daily   Continuous Infusions:  sodium chloride 50  mL/hr at 08/01/21 2007   cefTRIAXone (ROCEPHIN)  IV 2 g (08/03/21 0037)   chlorproMAZINE (THORAZINE) IV Stopped (08/03/21 0040)   metronidazole 500 mg (08/03/21 1135)   promethazine (PHENERGAN) injection (IM or IVPB) 12.5 mg (08/02/21 1736)          Aline August, MD [facility] 08/03/2021, 1:02 PM '

## 2021-08-03 NOTE — Discharge Summary (Addendum)
Physician Discharge Summary  Glen Freeman NLZ:[id number] DOB: October 08, 1951 DOA: 07/27/2021  PCP: Velna Hatchet, MD  Admit date: 07/27/2021 Discharge date: 08/04/2021  Admitted From: Home Disposition: Home  Recommendations for Outpatient Follow-up:  Follow up with PCP in 1 week with repeat CBC/BMP Outpatient follow-up with general surgery if needed Outpatient follow-up with GI Follow up in ED if symptoms worsen or new appear   Home Health: No Equipment/Devices: None  Discharge Condition: Stable CODE STATUS: Full Diet recommendation: Heart healthy  Brief/Interim Summary: 70 year old male with history of hypertension not on meds, breast cancer, remote DVT presented with progressive liver worsening abdominal pain and vomiting.  He had presented 2 days prior to admission to the ED with abdominal pain and vomiting and CT had showed uncomplicated diverticulitis for which she was discharged with antibiotics.  He returned to the ED with worsening abdominal pain and vomiting.  CT now showed small bowel obstruction with transition point in the left lower quadrant.  General surgery was consulted.  Patient was started on IV antibiotics, IV fluids and had NG tube placed.  During the hospitalization, his condition has gradually improved.  NG tube has been removed and patient's diet has been gradually advanced by general surgery.  He is currently tolerating diet.  General surgery has cleared the patient for discharge.  He will be discharged home today.  Discharge Diagnoses:   Small bowel obstruction -Present on admission.  Treated conservatively with IV fluids, pain management, NGT.  General surgery evaluated and followed the patient during the hospitalization.  NG tube was removed on 07/30/2021.   -Diet has been gradually advanced by general surgery and patient is currently tolerating diet  -He had some nausea and vomiting on 08/03/2021 hence patient was not discharged.  He was started on  Protonix -General surgery has cleared the patient for discharge.   -Subsequently, he has tolerated diet and is having bowel movement.  He will be discharged home today on oral Protonix 40 mg daily for [redacted] weeks along with as needed Maalox.  Recent diagnosis of acute diverticulitis -Patient has received 7 days of IV antibiotics.  No need for any more antibiotics on discharge.  Patient will benefit from outpatient evaluation and follow-up by GI for possibility of colonoscopy in 4 to 6 weeks.  This can be set up as an outpatient by PCP.   Essential hypertension -Blood pressure intermittently elevated.  Continue amlodipine.  Discharge Instructions Allergies as of 08/04/2021   No Known Allergies      Medication List     STOP taking these medications    aluminum-magnesium hydroxide 200-200 MG/5ML suspension   amoxicillin-clavulanate 875-125 MG tablet Commonly known as: AUGMENTIN   aspirin 81 MG EC tablet   omeprazole 20 MG capsule Commonly known as: PRILOSEC       TAKE these medications    alum & mag hydroxide-simeth 200-200-20 MG/5ML suspension Commonly known as: MAALOX/MYLANTA Take 15 mLs by mouth every 4 (four) hours as needed for indigestion or heartburn.   amLODipine 5 MG tablet Commonly known as: NORVASC Take 5 mg by mouth at bedtime.   HYDROcodone-acetaminophen 5-325 MG tablet Commonly known as: NORCO/VICODIN Take 1 tablet by mouth every 6 (six) hours as needed. What changed: reasons to take this   ibuprofen 200 MG tablet Commonly known as: ADVIL Take 200 mg by mouth every 6 (six) hours as needed for mild pain.   magnesium 30 MG tablet Take 30 mg by mouth 2 (two) times daily.   multivitamin tablet  Take 1 tablet by mouth daily.   naproxen 375 MG tablet Commonly known as: NAPROSYN Take 1 tablet (375 mg total) by mouth 2 (two) times daily.   ondansetron 4 MG tablet Commonly known as: ZOFRAN Take 1 tablet (4 mg total) by mouth every 6 (six) hours as needed  for nausea.   pantoprazole 40 MG tablet Commonly known as: Protonix Take 1 tablet (40 mg total) by mouth daily for 15 days.   promethazine 25 MG suppository Commonly known as: PHENERGAN Place 1 suppository (25 mg total) rectally every 6 (six) hours as needed for nausea or vomiting.   tamsulosin 0.4 MG Caps capsule Commonly known as: FLOMAX Take 0.4 mg by mouth at bedtime.   vitamin C 1000 MG tablet Take 1,000 mg by mouth daily.           No Known Allergies  Consultations: General surgery   Procedures/Studies: CT Abdomen Pelvis W Contrast  Result Date: 07/28/2021 CLINICAL DATA:  Nausea, vomiting and weakness. EXAM: CT ABDOMEN AND PELVIS WITH CONTRAST TECHNIQUE: Multidetector CT imaging of the abdomen and pelvis was performed using the standard protocol following bolus administration of intravenous contrast. CONTRAST:  38mL OMNIPAQUE IOHEXOL 350 MG/ML SOLN COMPARISON:  July 25, 2021 FINDINGS: Lower chest: Mild to moderate severity areas of atelectasis are noted within the posterior aspect of the bilateral lung bases. Hepatobiliary: No focal liver abnormality is seen. No gallstones, gallbladder wall thickening, or biliary dilatation. Heterogeneous increased attenuation is seen within the dependent portion of the gallbladder lumen. Pancreas: Unremarkable. No pancreatic ductal dilatation or surrounding inflammatory changes. Spleen: Normal in size without focal abnormality. Adrenals/Urinary Tract: Adrenal glands are unremarkable. Kidneys are normal, without renal calculi, focal lesion, or hydronephrosis. Bladder is unremarkable. Stomach/Bowel: Stomach is within normal limits. Numerous dilated small bowel loops are seen within the abdomen and pelvis. A transition zone is noted within the left lower quadrant/left hemipelvis (axial CT images 55 through 62, CT series 2). This is along the expected region of the proximal ileum. Noninflamed diverticula are noted within the descending and  sigmoid colon. Vascular/Lymphatic: No significant vascular findings are present. No enlarged abdominal or pelvic lymph nodes. 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 Other: A very small fat containing umbilical hernia is noted. No abdominopelvic ascites. Musculoskeletal: Degenerative changes are seen within the lumbar spine. IMPRESSION: 1. Small-bowel obstruction with a transition zone noted within the left lower quadrant/left hemipelvis. 2. Colonic diverticulosis. 3. Heterogeneous increased attenuation within the gallbladder lumen which may represent retained contrast versus gallbladder sludge. Electronically Signed   By: Virgina Norfolk M.D.   On: 07/28/2021 01:16   CT ABDOMEN PELVIS W CONTRAST  Result Date: 07/25/2021 CLINICAL DATA:  Suspected diverticulitis. LEFT LOWER QUADRANT abdominal pain. EXAM: CT ABDOMEN AND PELVIS WITH CONTRAST TECHNIQUE: Multidetector CT imaging of the abdomen and pelvis was performed using the standard protocol following bolus administration of intravenous contrast. CONTRAST:  75mL OMNIPAQUE IOHEXOL 350 MG/ML SOLN COMPARISON:  06/08/2019 FINDINGS: Lower chest: There is minimal atelectasis or scarring within the lingula and lung bases. Hepatobiliary: No focal liver abnormality is seen. No radiopaque gallstones, biliary dilatation, or pericholecystic inflammatory changes. Pancreas: In the pancreatic tail, there is a 0.9 centimeter cyst, similar in appearance to prior studies. No new pancreatic abnormality. Spleen: Normal in size without focal abnormality. Adrenals/Urinary Tract: Adrenal glands are normal. Small peripheral renal cysts are noted. No suspicious renal mass. No intrarenal stones or hydronephrosis. The ureters are unremarkable. Stomach/Bowel: Stomach and small bowel loops are normal  in appearance. The appendix is well seen and normal in appearance. There are extensive diverticula within the region of the sigmoid colon. There is associated  inflammatory change in this segment, consistent with acute diverticulitis. There is no evidence for perforation. No abscess. No obstructing mass. Significant stool burden noted. Vascular/Lymphatic: No significant vascular findings are present. No enlarged abdominal or pelvic lymph nodes. Reproductive: Prostatic seeds noted. Other: Small fat containing paraumbilical hernia. Musculoskeletal: Mild degenerative changes in the lumbar spine. IMPRESSION: 1. Acute diverticulitis of the sigmoid colon. No perforation or abscess. 2. Stable appearance of 0.9 centimeter cyst in the pancreatic tail. This has been characterized with MRI. It was recommended that the patient have MRI 2 years from the last study (08/14/2020). 3. Prostatic seeds. 4. Small fat containing paraumbilical hernia. Electronically Signed   By: Nolon Nations M.D.   On: 07/25/2021 13:47   DG Abd Acute W/Chest  Result Date: 07/28/2021 CLINICAL DATA:  Nausea and vomiting with generalized weakness. EXAM: DG ABDOMEN ACUTE WITH 1 VIEW CHEST COMPARISON:  None. FINDINGS: Multiple dilated small bowel loops are seen throughout the abdomen. There is no evidence of free air. Radiopaque contrast is seen throughout portions of the large bowel which is normal in caliber. No radiopaque calculi are seen. Heart size and mediastinal contours are within normal limits. A trace amount of atelectasis is seen within the left lung base. IMPRESSION: 1. Findings consistent with a partial small bowel obstruction versus ileus. 2. Trace amount of left basilar atelectasis. Electronically Signed   By: Virgina Norfolk M.D.   On: 07/28/2021 00:24   DG Abd Portable 1V  Result Date: 07/31/2021 CLINICAL DATA:  Small bowel obstruction. EXAM: PORTABLE ABDOMEN - 1 VIEW COMPARISON:  Radiograph 07/30/2021 FINDINGS: Interval removal of enteric tube. Several dilated small bowel loops measuring up to 4.4 cm are visualized in the right and mid abdomen. Residual oral contrast is noted within  the transverse and descending colon as well as the rectum. No free air given the limits of these supine images. Prostate brachytherapy seeds. IMPRESSION: Persistent partial small bowel obstruction with several loops of dilated small bowel. Electronically Signed   By: Ileana Roup M.D.   On: 07/31/2021 11:54   DG Abd Portable 1V  Result Date: 07/30/2021 CLINICAL DATA:  Abdominal pain and discomfort. Possible small bowel obstruction. EXAM: PORTABLE ABDOMEN - 1 VIEW COMPARISON:  Radiography yesterday.  CT 07/28/2021. FINDINGS: Nasogastric tube in the stomach. Slightly less small bowel distension, but with persistent dilated fluid and air-filled loops consistent with a degree of ongoing small bowel obstruction. Previously administered contrast has reached the colon. IMPRESSION: Evidence of continued partial small bowel obstruction. There may be slightly less intestinal gas than was seen yesterday. Electronically Signed   By: Nelson Chimes M.D.   On: 07/30/2021 13:10   DG Abd Portable 1V-Small Bowel Obstruction Protocol-initial, 8 hr delay  Result Date: 07/29/2021 CLINICAL DATA:  8 hour delay SBO EXAM: PORTABLE ABDOMEN - 1 VIEW COMPARISON:  07/28/2021, CT 07/28/2021 FINDINGS: Esophageal tube tip and side port overlie the proximal stomach. Small amount of contrast in the gastric fundus. Persistent dilatation of small bowel measuring up to 4.1 cm, slightly increased and consistent with bowel obstruction. There is contrast in the colon but this was present on the radiograph from yesterday and likely reflects residual contrast from CT performed on October 1st. IMPRESSION: 1. Persistent/slight worsening of small bowel distension consistent with obstruction 2. There is contrast in the colon, however this is felt to be  from the previously performed CT on October 1st. Electronically Signed   By: Donavan Foil M.D.   On: 07/29/2021 18:28   DG Abd Portable 1V  Result Date: 07/28/2021 CLINICAL DATA:  NG tube placement  EXAM: PORTABLE ABDOMEN - 1 VIEW COMPARISON:  Same day radiograph FINDINGS: Nasogastric tube tip and side port overlie the stomach. Persistent multiple dilated loops of small bowel. No acute osseous abnormality. Bibasilar atelectasis. IMPRESSION: Nasogastric tube tip and side port overlie the stomach. Small bowel obstruction. Electronically Signed   By: Maurine Simmering M.D.   On: 07/28/2021 13:14      Subjective: Patient seen and examined at bedside.  No fever, worsening abdominal pain.  Having bowel movements.  No nausea or vomiting reported.  Feels much better and wants to go home today.  Discharge Exam: Vitals:   08/02/21 2059 08/03/21 0600  BP: (!) 148/93 (!) 138/92  Pulse: 87 95  Resp: 18 18  Temp: 99.7 F (37.6 C) 99.2 F (37.3 C)  SpO2: 97% 100%    General: Pt is alert, awake, not in acute distress.  Currently on room air. Cardiovascular: rate controlled, S1/S2 + Respiratory: bilateral decreased breath sounds at bases Abdominal: Soft, obese, NT, slightly distended, bowel sounds + Extremities: Trace lower extremity edema; no cyanosis    The results of significant diagnostics from this hospitalization (including imaging, microbiology, ancillary and laboratory) are listed below for reference.     Microbiology: Recent Results (from the past 240 hour(s))  Resp Panel by RT-PCR (Flu A&B, Covid) Nasopharyngeal Swab     Status: None   Collection Time: 07/28/21  1:49 AM   Specimen: Nasopharyngeal Swab; Nasopharyngeal(NP) swabs in vial transport medium  Result Value Ref Range Status   SARS Coronavirus 2 by RT PCR NEGATIVE NEGATIVE Final    Comment: (NOTE) SARS-CoV-2 target nucleic acids are NOT DETECTED.  The SARS-CoV-2 RNA is generally detectable in upper respiratory specimens during the acute phase of infection. The lowest concentration of SARS-CoV-2 viral copies this assay can detect is 138 copies/mL. A negative result does not preclude SARS-Cov-2 infection and should not be  used as the sole basis for treatment or other patient management decisions. A negative result may occur with  improper specimen collection/handling, submission of specimen other than nasopharyngeal swab, presence of viral mutation(s) within the areas targeted by this assay, and inadequate number of viral copies(<138 copies/mL). A negative result must be combined with clinical observations, patient history, and epidemiological information. The expected result is Negative.  Fact Sheet for Patients:  EntrepreneurPulse.com.au  Fact Sheet for Healthcare Providers:  IncredibleEmployment.be  This test is no t yet approved or cleared by the [place] FDA and  has been authorized for detection and/or diagnosis of SARS-CoV-2 by FDA under an Emergency Use Authorization (EUA). This EUA will remain  in effect (meaning this test can be used) for the duration of the COVID-19 declaration under Section 564(b)(1) of the Act, 21 U.S.C.section 360bbb-3(b)(1), unless the authorization is terminated  or revoked sooner.       Influenza A by PCR NEGATIVE NEGATIVE Final   Influenza B by PCR NEGATIVE NEGATIVE Final    Comment: (NOTE) The Xpert Xpress SARS-CoV-2/FLU/RSV plus assay is intended as an aid in the diagnosis of influenza from Nasopharyngeal swab specimens and should not be used as a sole basis for treatment. Nasal washings and aspirates are unacceptable for Xpert Xpress SARS-CoV-2/FLU/RSV testing.  Fact Sheet for Patients: EntrepreneurPulse.com.au  Fact Sheet for Healthcare Providers: IncredibleEmployment.be  This  test is not yet approved or cleared by the [place] and has been authorized for detection and/or diagnosis of SARS-CoV-2 by FDA under an Emergency Use Authorization (EUA). This EUA will remain in effect (meaning this test can be used) for the duration of the COVID-19 declaration under Section  564(b)(1) of the Act, 21 U.S.C. section 360bbb-3(b)(1), unless the authorization is terminated or revoked.  Performed at [facility], [place] [address] [place], [place] [address]      Labs: BNP (last 3 results) No results for input(s): BNP in the last 8760 hours. Basic Metabolic Panel: Recent Labs  Lab 07/27/21 1252 07/28/21 0537 07/30/21 0441 08/01/21 0505  NA 135 139 140 138  K 4.6 4.1 4.2 3.9  CL 95* 101 104 104  CO2 30 27 30 24   GLUCOSE 126* 151* 104* 89  BUN 24* 29* 24* 18  CREATININE 1.12 1.00 0.91 0.94  CALCIUM 9.5 9.4 8.7* 8.6*  MG  --   --   --  2.3   Liver Function Tests: Recent Labs  Lab 07/27/21 1252  AST 16  ALT 19  ALKPHOS 51  BILITOT 0.9  PROT 7.6  ALBUMIN 4.1   No results for input(s): LIPASE, AMYLASE in the last 168 hours. No results for input(s): AMMONIA in the last 168 hours. CBC: Recent Labs  Lab 07/27/21 1252 07/28/21 0537 07/30/21 0441 08/01/21 0505  WBC 10.7* 8.6 7.4 9.0  NEUTROABS 8.7*  --   --  6.4  HGB 14.6 14.0 12.8* 12.9*  HCT 45.1 42.7 39.6 40.2  MCV 86.9 85.4 85.9 85.9  PLT 236 213 204 232   Cardiac Enzymes: No results for input(s): CKTOTAL, CKMB, CKMBINDEX, TROPONINI in the last 168 hours. BNP: Invalid input(s): POCBNP CBG: No results for input(s): GLUCAP in the last 168 hours. D-Dimer No results for input(s): DDIMER in the last 72 hours. Hgb A1c No results for input(s): HGBA1C in the last 72 hours. Lipid Profile No results for input(s): CHOL, HDL, LDLCALC, TRIG, CHOLHDL, LDLDIRECT in the last 72 hours. Thyroid function studies No results for input(s): TSH, T4TOTAL, T3FREE, THYROIDAB in the last 72 hours.  Invalid input(s): FREET3 Anemia work up No results for input(s): VITAMINB12, FOLATE, FERRITIN, TIBC, IRON, RETICCTPCT in the last 72 hours. Urinalysis    Component Value Date/Time   COLORURINE YELLOW 07/27/2021 2344   APPEARANCEUR HAZY (A) 07/27/2021 2344   LABSPEC 1.030 07/27/2021 2344    PHURINE 5.0 07/27/2021 2344   GLUCOSEU NEGATIVE 07/27/2021 2344   HGBUR NEGATIVE 07/27/2021 2344   BILIRUBINUR NEGATIVE 07/27/2021 2344   BILIRUBINUR neg 02/13/2015 1029   KETONESUR NEGATIVE 07/27/2021 2344   PROTEINUR 100 (A) 07/27/2021 2344   UROBILINOGEN 1.0 02/13/2015 1029   NITRITE NEGATIVE 07/27/2021 2344   LEUKOCYTESUR NEGATIVE 07/27/2021 2344   Sepsis Labs Invalid input(s): PROCALCITONIN,  WBC,  LACTICIDVEN Microbiology Recent Results (from the past 240 hour(s))  Resp Panel by RT-PCR (Flu A&B, Covid) Nasopharyngeal Swab     Status: None   Collection Time: 07/28/21  1:49 AM   Specimen: Nasopharyngeal Swab; Nasopharyngeal(NP) swabs in vial transport medium  Result Value Ref Range Status   SARS Coronavirus 2 by RT PCR NEGATIVE NEGATIVE Final    Comment: (NOTE) SARS-CoV-2 target nucleic acids are NOT DETECTED.  The SARS-CoV-2 RNA is generally detectable in upper respiratory specimens during the acute phase of infection. The lowest concentration of SARS-CoV-2 viral copies this assay can detect is 138 copies/mL. A negative result does not preclude SARS-Cov-2  infection and should not be used as the sole basis for treatment or other patient management decisions. A negative result may occur with  improper specimen collection/handling, submission of specimen other than nasopharyngeal swab, presence of viral mutation(s) within the areas targeted by this assay, and inadequate number of viral copies(<138 copies/mL). A negative result must be combined with clinical observations, patient history, and epidemiological information. The expected result is Negative.  Fact Sheet for Patients:  EntrepreneurPulse.com.au  Fact Sheet for Healthcare Providers:  IncredibleEmployment.be  This test is no t yet approved or cleared by the [place] FDA and  has been authorized for detection and/or diagnosis of SARS-CoV-2 by FDA under an Emergency Use  Authorization (EUA). This EUA will remain  in effect (meaning this test can be used) for the duration of the COVID-19 declaration under Section 564(b)(1) of the Act, 21 U.S.C.section 360bbb-3(b)(1), unless the authorization is terminated  or revoked sooner.       Influenza A by PCR NEGATIVE NEGATIVE Final   Influenza B by PCR NEGATIVE NEGATIVE Final    Comment: (NOTE) The Xpert Xpress SARS-CoV-2/FLU/RSV plus assay is intended as an aid in the diagnosis of influenza from Nasopharyngeal swab specimens and should not be used as a sole basis for treatment. Nasal washings and aspirates are unacceptable for Xpert Xpress SARS-CoV-2/FLU/RSV testing.  Fact Sheet for Patients: EntrepreneurPulse.com.au  Fact Sheet for Healthcare Providers: IncredibleEmployment.be  This test is not yet approved or cleared by the [place] FDA and has been authorized for detection and/or diagnosis of SARS-CoV-2 by FDA under an Emergency Use Authorization (EUA). This EUA will remain in effect (meaning this test can be used) for the duration of the COVID-19 declaration under Section 564(b)(1) of the Act, 21 U.S.C. section 360bbb-3(b)(1), unless the authorization is terminated or revoked.  Performed at [facility], [place] [address] [place], [place] [address]      Time coordinating discharge: 35 minutes  SIGNED:   Aline August, MD  [facility] 08/04/2021, 11:46 AM

## 2021-08-04 ENCOUNTER — Encounter: Payer: Self-pay | Admitting: Nurse Practitioner

## 2021-08-04 MED ORDER — ONDANSETRON HCL 4 MG PO TABS: 4.0000 mg | ORAL_TABLET | Freq: Four times a day (QID) | ORAL | 0 refills | Status: DC | PRN

## 2021-08-04 MED ORDER — PANTOPRAZOLE SODIUM 40 MG PO TBEC: 40.0000 mg | DELAYED_RELEASE_TABLET | Freq: Every day | ORAL | 0 refills | Status: DC

## 2021-08-04 MED ORDER — ALUM & MAG HYDROXIDE-SIMETH 200-200-20 MG/5ML PO SUSP: 15.0000 mL | ORAL | 0 refills | Status: AC | PRN

## 2021-08-04 NOTE — Plan of Care (Signed)

## 2021-08-10 DIAGNOSIS — K56609 Unspecified intestinal obstruction, unspecified as to partial versus complete obstruction: Secondary | ICD-10-CM | POA: Diagnosis not present

## 2021-08-10 DIAGNOSIS — K5792 Diverticulitis of intestine, part unspecified, without perforation or abscess without bleeding: Secondary | ICD-10-CM | POA: Diagnosis not present

## 2021-08-10 DIAGNOSIS — I10 Essential (primary) hypertension: Secondary | ICD-10-CM | POA: Diagnosis not present

## 2021-08-26 ENCOUNTER — Ambulatory Visit (INDEPENDENT_AMBULATORY_CARE_PROVIDER_SITE_OTHER): Payer: BC Managed Care – PPO | Admitting: Nurse Practitioner

## 2021-08-26 ENCOUNTER — Encounter: Payer: Self-pay | Admitting: Nurse Practitioner

## 2021-08-26 VITALS — BP 130/78 | HR 88 | Ht 67.0 in | Wt 214.1 lb

## 2021-08-26 DIAGNOSIS — K56609 Unspecified intestinal obstruction, unspecified as to partial versus complete obstruction: Secondary | ICD-10-CM

## 2021-08-26 DIAGNOSIS — K219 Gastro-esophageal reflux disease without esophagitis: Secondary | ICD-10-CM

## 2021-08-26 DIAGNOSIS — K5792 Diverticulitis of intestine, part unspecified, without perforation or abscess without bleeding: Secondary | ICD-10-CM

## 2021-08-26 NOTE — Progress Notes (Signed)
ASSESSMENT AND PLAN    # 70 yo male recently hospitalized with acute sigmoid diverticulitis. Interestingly a follow up CTAP (still in hospital) didn't mention ongoing diverticulitis but showed a SBO with transition zone in the left pelvis ( ? Location of proximal ileum).  Surgery evaluated, symptoms improved with antibiotics and NG tube and he was discharged home on 08/04/21.   No further symptoms since. Recent illness associated with a 14 pound weight --No history of bowel obstruction in past. No history of abdominal surgeries.  Was SBO secondary to inflammation from adjacent sigmoid colon ?  --I will ask Dr. Fuller Plan to review CT scans.  It has been slightly over 5 years since his last colonoscopy.  I told the patient Dr. Fuller Plan will probably want to evaluate recent CT scan findings by colonoscopy but I would let him know.  Otherwise he would be due for polyp surveillance colonoscopy in April 2024  #GERD, asymptomatic on Pantoprazole about TIW --Needs to take 30 minutes prior to breakfast when he takes it.   HISTORY OF PRESENT ILLNESS     Chief Complaint : Hospital follow-up from diverticulitis  Glen Freeman is a 70 y.o. male with a past medical history significant for diverticulitis, kidney stones, hypertension. See PMH below for any additional history.   Glen Freeman is known to Dr. Fuller Plan.  In July 2017 he had a colonoscopy for evaluation of a positive FOBT .  A few small polyps were removed, left-sided diverticulosis was found.  Two of the polyps were tubular adenomas.   Patient was hospitalized for a week the beginning of October. I reviewed hospital records including consultations, labs, CTAP reports.  He presented with LLQ pain and vomiting.  WBC 8.3. Hgb 13. CTAP showed uncomplicated sigmoid diverticulitis.  He was started on antibiotics. While still hospitalized, repeat CT scan a few days later showed a small-bowel obstruction with a transition zone noted within the left lower  quadrant/left hemipelvis along the expected region of the proximal ileum.  There was no mention of ongoing diverticulitis.  There was possible gallbladder sludge.  NG tube was placed for decompression.  Surgery followed him throughout the hospital stay.  His symptoms resolved , he was discharged home on 08/04/2021 .  Patient said he has not had any further abdominal pain, nausea nor vomiting since hospital discharge.  He did lose several pounds associated with recent illness.Marland Kitchen  His bowel movements are normal on MiraLAX which was started upon hospital discharge.  He has not seen any blood in his stool  PREVIOUS GI EVALUATIONS:   July 2017 colonoscopy for positive FOBT --Complete exam, excellent prep.  A 7 mm sessile polyp was removed from the transverse colon.  2 polyps ranging 4 to 5 mm in size were removed from the rectum and ascending colon.  Internal hemorrhoids found.  A few diverticula in the left colon  Pathology: TUBULAR ADENOMA (2) AND BENIGN LYMPHOID POLYP.  Past Medical History:  Diagnosis Date   Allergic rhinitis    Bowel obstruction (HCC)    Diverticulosis    GERD (gastroesophageal reflux disease)    History of kidney stones    History of thrombophlebitis    2010  superficial lower extremity,  completed 6 months coumadin   Hypertension    followed by pcp   (08-23-2019 per has never had a stress test)   Nocturia    Prostate cancer [facility]) urologist-- dr winter/ oncologist-- dr Tammi Klippel   dx 05-24-2019--- Stage T1c,  Gleason  4+3   Venous insufficiency    Left  leg; with saphenous varicositis,      Past Surgical History:  Procedure Laterality Date   COLONOSCOPY  last one 05-12-2016   CYSTOSCOPY N/A 08/24/2019   Procedure: CYSTOSCOPY;  Surgeon: Ceasar Mons, MD;  Location: [facility];  Service: Urology;  Laterality: N/A;   endovascular laser ablation  2010   of veins in Left  leg per Dr. Kellie Simmering   KNEE RECONSTRUCTION Right 1970   football injury    [place] N/A 08/24/2019   Procedure: RADIOACTIVE SEED IMPLANT/BRACHYTHERAPY IMPLANT;  Surgeon: Ceasar Mons, MD;  Location: [facility];  Service: Urology;  Laterality: N/A;  only needs 90 min for all procedures   SPACE OAR INSTILLATION N/A 08/24/2019   Procedure: SPACE OAR INSTILLATION;  Surgeon: Ceasar Mons, MD;  Location: [facility];  Service: Urology;  Laterality: N/A;   Family History  Problem Relation Age of Onset   Breast cancer Other        family hx - 1st degree relatvie <50   Diabetes Other        1st degree relative   Hypertension Other    Prostate cancer Other        1st degree relative <50    Colon cancer Neg Hx    Social History   Tobacco Use   Smoking status: Never   Smokeless tobacco: Never  Vaping Use   Vaping Use: Never used  Substance Use Topics   Alcohol use: Not Currently    Alcohol/week: 0.0 standard drinks    Comment: occasional   Drug use: Never   Current Outpatient Medications  Medication Sig Dispense Refill   alum & mag hydroxide-simeth (MAALOX/MYLANTA) 200-200-20 MG/5ML suspension Take 15 mLs by mouth every 4 (four) hours as needed for indigestion or heartburn. 355 mL 0   amLODipine (NORVASC) 5 MG tablet Take 5 mg by mouth at bedtime.      Ascorbic Acid (VITAMIN C) 1000 MG tablet Take 1,000 mg by mouth daily.     ibuprofen (ADVIL,MOTRIN) 200 MG tablet Take 200 mg by mouth every 6 (six) hours as needed for mild pain.     magnesium 30 MG tablet Take 30 mg by mouth 2 (two) times daily.     Multiple Vitamin (MULTIVITAMIN) tablet Take 1 tablet by mouth daily.     ondansetron (ZOFRAN) 4 MG tablet Take 1 tablet (4 mg total) by mouth every 6 (six) hours as needed for nausea. 20 tablet 0   promethazine (PHENERGAN) 25 MG suppository Place 1 suppository (25 mg total) rectally every 6 (six) hours as needed for nausea or vomiting. 12 each 0   tamsulosin (FLOMAX) 0.4 MG CAPS capsule Take  0.4 mg by mouth at bedtime.     pantoprazole (PROTONIX) 40 MG tablet Take 1 tablet (40 mg total) by mouth daily for 15 days. 15 tablet 0   No current facility-administered medications for this visit.   No Known Allergies   Review of Systems: Positive for allergy, hearing problems excessive urination, frequent urination.  All other systems reviewed and negative except where noted in HPI.    PHYSICAL EXAM :    Wt Readings from Last 3 Encounters:  08/26/21 214 lb 2 oz (97.1 kg)  07/28/21 226 lb 12.8 oz (102.9 kg)  08/24/19 149 lb 9 oz (67.8 kg)    BP 130/78   Pulse 88   Ht 5\' 7"  (1.702 m)  Wt 214 lb 2 oz (97.1 kg)   BMI 33.54 kg/m  Constitutional:  Generally well appearing male in no acute distress. Psychiatric: Pleasant. Normal mood and affect. Behavior is normal. EENT: Pupils normal.  Conjunctivae are normal. No scleral icterus. Neck supple.  Cardiovascular: Normal rate, regular rhythm. No edema Pulmonary/chest: Effort normal and breath sounds normal. No wheezing, rales or rhonchi. Abdominal: Soft, nondistended, nontender. Bowel sounds active throughout. There are no masses palpable. No hepatomegaly. Neurological: Alert and oriented to person place and time. Skin: Skin is warm and dry. No rashes noted.  Tye Savoy, NP  08/26/2021, 10:37 AM  I spent 35 minutes total reviewing records, obtaining history, performing exam, counseling patient and documenting visit / findings.   Cc:   Velna Hatchet, MD

## 2021-08-26 NOTE — Patient Instructions (Signed)
If you are age 72 or older, your body mass index should be between 23-30. Your Body mass index is 33.54 kg/m. If this is out of the aforementioned range listed, please consider follow up with your Primary Care Provider.  The [facility] providers would like to encourage you to use [facility] to communicate with providers for non-urgent requests or questions.  Due to long hold times on the telephone, sending your provider a message by [facility] may be faster and more efficient way to get a response. Please allow 48 business hours for a response.  Please remember that this is for non-urgent requests/questions.  RECOMMENDATIONS: Dr. Fuller Plan will review your CT scan and determine when to have your colonoscopy.  It was great seeing you today! Thank you for entrusting me with your care and choosing [facility].  Tye Savoy, NP

## 2021-08-27 NOTE — Progress Notes (Signed)
SBO and diverticulitis treated and follow by General Surgery during October 2022 hospitalization. No history of prior abdominal surgery. The etiology of his SBO was not clear. His SBO resolved and he completed a course of antibiotics. Proceed with colonoscopy to further evaluate.

## 2021-08-31 ENCOUNTER — Telehealth: Payer: Self-pay

## 2021-08-31 NOTE — Telephone Encounter (Signed)
-----   Message from Willia Craze, NP sent at 08/31/2021 12:18 PM EST -----    ----- Message ----- From: Ladene Artist, MD Sent: 08/27/2021   4:58 PM EST To: Willia Craze, NP     ----- Message ----- From: Willia Craze, NP Sent: 08/26/2021   5:05 PM EDT To: Ladene Artist, MD

## 2021-08-31 NOTE — Progress Notes (Signed)
Hi Beth, please let Mr. Fulwider know that I spoke with Dr. Fuller Plan and he would like to proceed with a colonoscopy.  The patient knew this was a possibility and will not be surprised.  Diagnosis is recent diverticulitis with small bowel obstruction.  Would you please schedule this to be done at the [facility] something after about 2 weeks. Thanks

## 2021-08-31 NOTE — Telephone Encounter (Signed)
Scheduled colonoscopy in [place] on 10/08/21. Pre-visit scheduled on 09/29/21, patient to arrive on the 2nd floor at 3:20 pm. Patient notified of the above and in agreement.

## 2021-09-04 DIAGNOSIS — Z20828 Contact with and (suspected) exposure to other viral communicable diseases: Secondary | ICD-10-CM | POA: Diagnosis not present

## 2021-09-10 DIAGNOSIS — H401131 Primary open-angle glaucoma, bilateral, mild stage: Secondary | ICD-10-CM | POA: Diagnosis not present

## 2021-09-22 ENCOUNTER — Ambulatory Visit: Payer: Medicare Other | Admitting: Gastroenterology

## 2021-09-29 ENCOUNTER — Other Ambulatory Visit: Payer: Self-pay

## 2021-09-29 ENCOUNTER — Ambulatory Visit (AMBULATORY_SURGERY_CENTER): Payer: Self-pay

## 2021-09-29 VITALS — Ht 67.0 in | Wt 220.0 lb

## 2021-09-29 DIAGNOSIS — Z86010 Personal history of colonic polyps: Secondary | ICD-10-CM

## 2021-09-29 MED ORDER — PEG 3350-KCL-NA BICARB-NACL 420 G PO SOLR: 4000.0000 mL | Freq: Once | ORAL | 0 refills | Status: AC

## 2021-09-29 NOTE — Progress Notes (Signed)
No allergies to soy or egg Pt is not on blood thinners or diet pills Denies issues with sedation/intubation Denies atrial flutter/fib Denies constipation   Emmi instructions given to pt  Pt is aware of Covid safety and care partner requirements.  Glen Freeman pt's wife is with patient

## 2021-10-08 ENCOUNTER — Ambulatory Visit (AMBULATORY_SURGERY_CENTER): Payer: BC Managed Care – PPO | Admitting: Gastroenterology

## 2021-10-08 ENCOUNTER — Other Ambulatory Visit: Payer: Self-pay

## 2021-10-08 ENCOUNTER — Encounter: Payer: Self-pay | Admitting: Gastroenterology

## 2021-10-08 VITALS — BP 134/77 | HR 63 | Temp 97.5°F | Resp 13 | Ht 67.0 in | Wt 220.0 lb

## 2021-10-08 DIAGNOSIS — D123 Benign neoplasm of transverse colon: Secondary | ICD-10-CM | POA: Diagnosis not present

## 2021-10-08 DIAGNOSIS — K5520 Angiodysplasia of colon without hemorrhage: Secondary | ICD-10-CM

## 2021-10-08 DIAGNOSIS — R933 Abnormal findings on diagnostic imaging of other parts of digestive tract: Secondary | ICD-10-CM

## 2021-10-08 DIAGNOSIS — K5733 Diverticulitis of large intestine without perforation or abscess with bleeding: Secondary | ICD-10-CM | POA: Diagnosis not present

## 2021-10-08 DIAGNOSIS — K640 First degree hemorrhoids: Secondary | ICD-10-CM

## 2021-10-08 DIAGNOSIS — C61 Malignant neoplasm of prostate: Secondary | ICD-10-CM | POA: Diagnosis not present

## 2021-10-08 DIAGNOSIS — Z86010 Personal history of colonic polyps: Secondary | ICD-10-CM

## 2021-10-08 MED ORDER — SODIUM CHLORIDE 0.9 % IV SOLN: 500.0000 mL | Freq: Once | INTRAVENOUS | Status: DC

## 2021-10-08 NOTE — Patient Instructions (Signed)
Handouts provided on polyps, diverticulosis, hemorrhoids and high-fiber diet.   High-fiber diet long term.   YOU HAD AN ENDOSCOPIC PROCEDURE TODAY AT [facility]:   Refer to the procedure report that was given to you for any specific questions about what was found during the examination.  If the procedure report does not answer your questions, please call your gastroenterologist to clarify.  If you requested that your care partner not be given the details of your procedure findings, then the procedure report has been included in a sealed envelope for you to review at your convenience later.  YOU SHOULD EXPECT: Some feelings of bloating in the abdomen. Passage of more gas than usual.  Walking can help get rid of the air that was put into your GI tract during the procedure and reduce the bloating. If you had a lower endoscopy (such as a colonoscopy or flexible sigmoidoscopy) you may notice spotting of blood in your stool or on the toilet paper. If you underwent a bowel prep for your procedure, you may not have a normal bowel movement for a few days.  Please Note:  You might notice some irritation and congestion in your nose or some drainage.  This is from the oxygen used during your procedure.  There is no need for concern and it should clear up in a day or so.  SYMPTOMS TO REPORT IMMEDIATELY:  Following lower endoscopy (colonoscopy or flexible sigmoidoscopy):  Excessive amounts of blood in the stool  Significant tenderness or worsening of abdominal pains  Swelling of the abdomen that is new, acute  Fever of 100F or higher  For urgent or emergent issues, a gastroenterologist can be reached at any hour by calling [phone]. Do not use MyChart messaging for urgent concerns.    DIET:  We do recommend a small meal at first, but then you may proceed to your regular diet.  Drink plenty of fluids but you should avoid alcoholic beverages for 24 hours.  ACTIVITY:  You should plan  to take it easy for the rest of today and you should NOT DRIVE or use heavy machinery until tomorrow (because of the sedation medicines used during the test).    FOLLOW UP: Our staff will call the number listed on your records 48-72 hours following your procedure to check on you and address any questions or concerns that you may have regarding the information given to you following your procedure. If we do not reach you, we will leave a message.  We will attempt to reach you two times.  During this call, we will ask if you have developed any symptoms of COVID 19. If you develop any symptoms (ie: fever, flu-like symptoms, shortness of breath, cough etc.) before then, please call [phone].  If you test positive for Covid 19 in the 2 weeks post procedure, please call and report this information to [place].    If any biopsies were taken you will be contacted by phone or by letter within the next 1-3 weeks.  Please call us at [phone] if you have not heard about the biopsies in 3 weeks.    SIGNATURES/CONFIDENTIALITY: You and/or your care partner have signed paperwork which will be entered into your electronic medical record.  These signatures attest to the fact that that the information above on your After Visit Summary has been reviewed and is understood.  Full responsibility of the confidentiality of this discharge information lies with you and/or your care-partner.

## 2021-10-08 NOTE — Progress Notes (Signed)
To PACU, VSS. Report to [email or web address] 

## 2021-10-08 NOTE — Op Note (Signed)
[facility] Patient Name: Glen Freeman Procedure Date: 10/08/2021 9:55 AM MRN: [id number] Endoscopist: Ladene Artist , MD Age: 70 Referring MD:  Date of Birth: [birth year] Gender: Male Account #: [id number] Procedure:                Colonoscopy Indications:              Abnormal CT of the GI tract, Follow-up of                            diverticulitis, Personal history of adenomatous                            colon polyps Medicines:                Monitored Anesthesia Care Procedure:                Pre-Anesthesia Assessment:                           - Prior to the procedure, a History and Physical                            was performed, and patient medications and                            allergies were reviewed. The patient's tolerance of                            previous anesthesia was also reviewed. The risks                            and benefits of the procedure and the sedation                            options and risks were discussed with the patient.                            All questions were answered, and informed consent                            was obtained. Prior Anticoagulants: The patient has                            taken no previous anticoagulant or antiplatelet                            agents. ASA Grade Assessment: II - A patient with                            mild systemic disease. After reviewing the risks                            and benefits, the patient was deemed in  satisfactory condition to undergo the procedure.                           After obtaining informed consent, the colonoscope                            was passed under direct vision. Throughout the                            procedure, the patient's blood pressure, pulse, and                            oxygen saturations were monitored continuously. The                            Olympus CF-HQ190L [id number]) Colonoscope was                             introduced through the anus and advanced to the the                            cecum, identified by appendiceal orifice and                            ileocecal valve. The ileocecal valve, appendiceal                            orifice, and rectum were photographed. The quality                            of the bowel preparation was excellent. The                            colonoscopy was performed without difficulty. The                            patient tolerated the procedure well. Scope In: 10:09:04 AM Scope Out: 10:25:04 AM Scope Withdrawal Time: 0 hours 11 minutes 27 seconds  Total Procedure Duration: 0 hours 16 minutes 0 seconds  Findings:                 The perianal and digital rectal examinations were                            normal.                           A 6 mm polyp was found in the transverse colon. The                            polyp was sessile. The polyp was removed with a                            cold snare. Resection and retrieval were complete.  Multiple small-mouthed diverticula were found in                            the left colon. There was evidence of diverticular                            spasm. There was no evidence of diverticular                            bleeding.                           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                           Internal hemorrhoids were found during                            retroflexion. The hemorrhoids were moderate and                            Grade I (internal hemorrhoids that do not prolapse).                           The exam was otherwise without abnormality on                            direct and retroflexion views. Complications:            No immediate complications. Estimated blood loss:                             None. Estimated Blood Loss:     Estimated blood loss: none. Impression:               - One 6 mm polyp in the transverse colon, removed                            with a cold snare. Resected and retrieved.                           - Moderate diverticulosis in the left colon.                           - A few non-bleeding colonic angioectasias.                           - Internal hemorrhoids.                           -  The examination was otherwise normal on direct                            and retroflexion views. Recommendation:           - Repeat colonoscopy after studies are complete for                            surveillance based on pathology results.                           - Patient has a contact number available for                            emergencies. The signs and symptoms of potential                            delayed complications were discussed with the                            patient. Return to normal activities tomorrow.                            Written discharge instructions were provided to the                            patient.                           - High fiber diet long term.                           - Continue present medications.                           - Await pathology results. Ladene Artist, MD 10/08/2021 10:31:31 AM This report has been signed electronically.

## 2021-10-08 NOTE — Progress Notes (Signed)
History & Physical  Primary Care Physician:  Velna Hatchet, MD Primary Gastroenterologist: Lucio Edward, MD  CHIEF COMPLAINT:  Personal history of colon polyps, recent diverticulitis, abnormal CT of the colon  HPI: Glen Freeman is a 70 y.o. male with a history of SBO and diverticulitis, abnormal CT of the colon in October 2022.  Personal history of adenomatous colon polyps in 2017 for colonoscopy.    Past Medical History:  Diagnosis Date   Allergic rhinitis    Bowel obstruction (HCC)    Diverticulosis    GERD (gastroesophageal reflux disease)    Glaucoma    History of kidney stones    History of thrombophlebitis    2010  superficial lower extremity,  completed 6 months coumadin   Hypertension    followed by pcp   (08-23-2019 per has never had a stress test)   Nocturia    Prostate cancer [facility]) urologist-- dr winter/ oncologist-- dr Tammi Klippel   dx 05-24-2019--- Stage T1c,  Gleason 4+3   Venous insufficiency    Left  leg; with saphenous varicositis,     Past Surgical History:  Procedure Laterality Date   COLONOSCOPY  last one 05-12-2016   CYSTOSCOPY N/A 08/24/2019   Procedure: CYSTOSCOPY;  Surgeon: Ceasar Mons, MD;  Location: [facility];  Service: Urology;  Laterality: N/A;   endovascular laser ablation  2010   of veins in Left  leg per Dr. Kellie Simmering   KNEE RECONSTRUCTION Right 1970   football injury   [place] N/A 08/24/2019   Procedure: RADIOACTIVE SEED IMPLANT/BRACHYTHERAPY IMPLANT;  Surgeon: Ceasar Mons, MD;  Location: [facility];  Service: Urology;  Laterality: N/A;  only needs 90 min for all procedures   SPACE OAR INSTILLATION N/A 08/24/2019   Procedure: SPACE OAR INSTILLATION;  Surgeon: Ceasar Mons, MD;  Location: [facility];  Service: Urology;  Laterality: N/A;    Prior to Admission medications   Medication Sig Start Date End Date Taking? Authorizing Provider   alum & mag hydroxide-simeth (MAALOX/MYLANTA) 200-200-20 MG/5ML suspension Take 15 mLs by mouth every 4 (four) hours as needed for indigestion or heartburn. 08/04/21  Yes Aline August, MD  amLODipine (NORVASC) 5 MG tablet Take 5 mg by mouth at bedtime.  04/21/19  Yes [provider]  latanoprost (XALATAN) 0.005 % ophthalmic solution 1 drop at bedtime. 09/10/21  Yes [provider]  Multiple Vitamin (MULTIVITAMIN) tablet Take 1 tablet by mouth daily.   Yes [provider]  tamsulosin (FLOMAX) 0.4 MG CAPS capsule Take 0.4 mg by mouth at bedtime. 06/13/21  Yes [provider]  Ascorbic Acid (VITAMIN C) 1000 MG tablet Take 1,000 mg by mouth daily.    [provider]  ibuprofen (ADVIL,MOTRIN) 200 MG tablet Take 200 mg by mouth every 6 (six) hours as needed for mild pain.    [provider]  Lansoprazole (PREVACID PO) Take by mouth.    [provider]  magnesium 30 MG tablet Take 30 mg by mouth 2 (two) times daily. Patient not taking: Reported on 09/29/2021    [provider]  ondansetron (ZOFRAN) 4 MG tablet Take 1 tablet (4 mg total) by mouth every 6 (six) hours as needed for nausea. Patient not taking: Reported on 09/29/2021 08/04/21   Aline August, MD  polyethylene glycol (MIRALAX / GLYCOLAX) 17 g packet Take 17 g by mouth daily. 1 capful in 8 ounces of water a day    [provider]  promethazine (PHENERGAN) 25 MG suppository Place 1 suppository (25 mg total) rectally every 6 (six) hours as needed for nausea or vomiting. Patient not taking: Reported on 09/29/2021 07/26/21   Varney Biles, MD  saccharomyces boulardii (FLORASTOR) 250 MG capsule Take 250 mg by mouth daily.    [provider]    Current Outpatient Medications  Medication Sig Dispense Refill   alum & mag hydroxide-simeth (MAALOX/MYLANTA) 200-200-20 MG/5ML suspension Take 15 mLs by mouth every 4 (four) hours as needed for indigestion or heartburn.  355 mL 0   amLODipine (NORVASC) 5 MG tablet Take 5 mg by mouth at bedtime.      latanoprost (XALATAN) 0.005 % ophthalmic solution 1 drop at bedtime.     Multiple Vitamin (MULTIVITAMIN) tablet Take 1 tablet by mouth daily.     tamsulosin (FLOMAX) 0.4 MG CAPS capsule Take 0.4 mg by mouth at bedtime.     Ascorbic Acid (VITAMIN C) 1000 MG tablet Take 1,000 mg by mouth daily.     ibuprofen (ADVIL,MOTRIN) 200 MG tablet Take 200 mg by mouth every 6 (six) hours as needed for mild pain.     Lansoprazole (PREVACID PO) Take by mouth.     magnesium 30 MG tablet Take 30 mg by mouth 2 (two) times daily. (Patient not taking: Reported on 09/29/2021)     ondansetron (ZOFRAN) 4 MG tablet Take 1 tablet (4 mg total) by mouth every 6 (six) hours as needed for nausea. (Patient not taking: Reported on 09/29/2021) 20 tablet 0   polyethylene glycol (MIRALAX / GLYCOLAX) 17 g packet Take 17 g by mouth daily. 1 capful in 8 ounces of water a day     promethazine (PHENERGAN) 25 MG suppository Place 1 suppository (25 mg total) rectally every 6 (six) hours as needed for nausea or vomiting. (Patient not taking: Reported on 09/29/2021) 12 each 0   saccharomyces boulardii (FLORASTOR) 250 MG capsule Take 250 mg by mouth daily.     Current Facility-Administered Medications  Medication Dose Route Frequency Provider Last Rate Last Admin   0.9 %  sodium chloride infusion  500 mL Intravenous Once Ladene Artist, MD        Allergies as of 10/08/2021   (No Known Allergies)    Family History  Problem Relation Age of Onset   Breast cancer Other        family hx - 1st degree relatvie <50   Diabetes Other        1st degree relative   Hypertension Other    Prostate cancer Other        1st degree relative <50    Colon cancer Neg Hx    Colon polyps Neg Hx    Esophageal cancer Neg Hx    Rectal cancer Neg Hx    Stomach cancer Neg Hx     Social History   Socioeconomic History   Marital status: Married    Spouse name: Not on  file   Number of children: 5   Years of education: Not on file   Highest education level: Not on file  Occupational History    Comment: full time  Tobacco Use   Smoking status: Never   Smokeless tobacco: Never  Vaping Use   Vaping Use: Never used  Substance and Sexual Activity   Alcohol use: Not Currently    Alcohol/week: 0.0 standard drinks    Comment: occasional   Drug use: Never   Sexual activity: Not on file  Other Topics Concern  Not on file  Social History Narrative   Married. Has 2 daughters and 3 sons. NKDA. Resides in [place], [place]. Works as a Engineer, civil (consulting).   Social Determinants of Health   Financial Resource Strain: Not on file  Food Insecurity: Not on file  Transportation Needs: Not on file  Physical Activity: Not on file  Stress: Not on file  Social Connections: Not on file  Intimate Partner Violence: Not on file    Review of Systems:  All systems reviewed an negative except where noted in HPI.  Gen: Denies any fever, chills, sweats, anorexia, fatigue, weakness, malaise, weight loss, and sleep disorder CV: Denies chest pain, angina, palpitations, syncope, orthopnea, PND, peripheral edema, and claudication. Resp: Denies dyspnea at rest, dyspnea with exercise, cough, sputum, wheezing, coughing up blood, and pleurisy. GI: Denies vomiting blood, jaundice, and fecal incontinence.   Denies dysphagia or odynophagia. GU : Denies urinary burning, blood in urine, urinary frequency, urinary hesitancy, nocturnal urination, and urinary incontinence. MS: Denies joint pain, limitation of movement, and swelling, stiffness, low back pain, extremity pain. Denies muscle weakness, cramps, atrophy.  Derm: Denies rash, itching, dry skin, hives, moles, warts, or unhealing ulcers.  Psych: Denies depression, anxiety, memory loss, suicidal ideation, hallucinations, paranoia, and confusion. Heme: Denies bruising, bleeding, and enlarged lymph nodes. Neuro:  Denies any headaches,  dizziness, paresthesias. Endo:  Denies any problems with DM, thyroid, adrenal function.   Physical Exam: General:  Alert, well-developed, in NAD Head:  Normocephalic and atraumatic. Eyes:  Sclera clear, no icterus.   Conjunctiva pink. Ears:  Normal auditory acuity. Mouth:  No deformity or lesions.  Neck:  Supple; no masses . Lungs:  Clear throughout to auscultation.   No wheezes, crackles, or rhonchi. No acute distress. Heart:  Regular rate and rhythm; no murmurs. Abdomen:  Soft, nondistended, nontender. No masses, hepatomegaly. No obvious masses.  Normal bowel .    Rectal:  Deferred   Msk:  Symmetrical without gross deformities.. Pulses:  Normal pulses noted. Extremities:  Without edema. Neurologic:  Alert and  oriented x4;  grossly normal neurologically. Skin:  Intact without significant lesions or rashes. Cervical Nodes:  No significant cervical adenopathy. Psych:  Alert and cooperative. Normal mood and affect.   Impression / Plan:   History of SBO and diverticulitis, abnormal CT of the colon in October 2022.  Personal history of adenomatous colon polyps in 2017 for colonoscopy.    Pricilla Riffle. Fuller Plan  10/08/2021, 9:59 AM See Shea Evans, [facility], to contact our on call provider

## 2021-10-08 NOTE — Progress Notes (Signed)
Called to room to assist during endoscopic procedure.  Patient ID and intended procedure confirmed with present staff. Received instructions for my participation in the procedure from the performing physician.  

## 2021-10-08 NOTE — Progress Notes (Signed)
Pt's states no medical or surgical changes since previsit or office visit. VS by DT. °

## 2021-10-12 ENCOUNTER — Telehealth: Payer: Self-pay

## 2021-10-12 ENCOUNTER — Telehealth: Payer: Self-pay | Admitting: *Deleted

## 2021-10-12 NOTE — Telephone Encounter (Signed)
Left message on follow up call. 

## 2021-10-12 NOTE — Telephone Encounter (Signed)
Second attempt, left VM.  

## 2021-10-16 DIAGNOSIS — C61 Malignant neoplasm of prostate: Secondary | ICD-10-CM | POA: Diagnosis not present

## 2021-10-16 DIAGNOSIS — N5235 Erectile dysfunction following radiation therapy: Secondary | ICD-10-CM | POA: Diagnosis not present

## 2021-11-02 ENCOUNTER — Encounter: Payer: Self-pay | Admitting: Gastroenterology

## 2021-11-13 DIAGNOSIS — H401131 Primary open-angle glaucoma, bilateral, mild stage: Secondary | ICD-10-CM | POA: Diagnosis not present

## 2021-11-30 DIAGNOSIS — R051 Acute cough: Secondary | ICD-10-CM | POA: Diagnosis not present

## 2021-11-30 DIAGNOSIS — R0981 Nasal congestion: Secondary | ICD-10-CM | POA: Diagnosis not present

## 2021-11-30 DIAGNOSIS — U071 COVID-19: Secondary | ICD-10-CM | POA: Diagnosis not present

## 2022-01-05 DIAGNOSIS — M25561 Pain in right knee: Secondary | ICD-10-CM | POA: Diagnosis not present

## 2022-01-05 DIAGNOSIS — M25562 Pain in left knee: Secondary | ICD-10-CM | POA: Diagnosis not present

## 2022-02-02 DIAGNOSIS — M25562 Pain in left knee: Secondary | ICD-10-CM | POA: Diagnosis not present

## 2022-02-02 DIAGNOSIS — M25561 Pain in right knee: Secondary | ICD-10-CM | POA: Diagnosis not present

## 2022-04-16 DIAGNOSIS — C61 Malignant neoplasm of prostate: Secondary | ICD-10-CM | POA: Diagnosis not present

## 2022-05-27 DIAGNOSIS — R7989 Other specified abnormal findings of blood chemistry: Secondary | ICD-10-CM | POA: Diagnosis not present

## 2022-05-27 DIAGNOSIS — I10 Essential (primary) hypertension: Secondary | ICD-10-CM | POA: Diagnosis not present

## 2022-05-27 DIAGNOSIS — Z125 Encounter for screening for malignant neoplasm of prostate: Secondary | ICD-10-CM | POA: Diagnosis not present

## 2022-06-03 DIAGNOSIS — Z0000 Encounter for general adult medical examination without abnormal findings: Secondary | ICD-10-CM | POA: Diagnosis not present

## 2022-06-03 DIAGNOSIS — Z1339 Encounter for screening examination for other mental health and behavioral disorders: Secondary | ICD-10-CM | POA: Diagnosis not present

## 2022-06-03 DIAGNOSIS — I10 Essential (primary) hypertension: Secondary | ICD-10-CM | POA: Diagnosis not present

## 2022-06-03 DIAGNOSIS — C61 Malignant neoplasm of prostate: Secondary | ICD-10-CM | POA: Diagnosis not present

## 2022-06-03 DIAGNOSIS — N400 Enlarged prostate without lower urinary tract symptoms: Secondary | ICD-10-CM | POA: Diagnosis not present

## 2022-06-03 DIAGNOSIS — Z1331 Encounter for screening for depression: Secondary | ICD-10-CM | POA: Diagnosis not present

## 2022-06-03 DIAGNOSIS — K219 Gastro-esophageal reflux disease without esophagitis: Secondary | ICD-10-CM | POA: Diagnosis not present

## 2022-06-03 DIAGNOSIS — R82998 Other abnormal findings in urine: Secondary | ICD-10-CM | POA: Diagnosis not present

## 2022-07-13 ENCOUNTER — Other Ambulatory Visit: Payer: Self-pay | Admitting: General Surgery

## 2022-07-13 DIAGNOSIS — K862 Cyst of pancreas: Secondary | ICD-10-CM

## 2022-07-16 DIAGNOSIS — M25561 Pain in right knee: Secondary | ICD-10-CM | POA: Diagnosis not present

## 2022-07-31 ENCOUNTER — Other Ambulatory Visit: Payer: BC Managed Care – PPO

## 2022-08-24 DIAGNOSIS — M79674 Pain in right toe(s): Secondary | ICD-10-CM | POA: Diagnosis not present

## 2022-08-24 DIAGNOSIS — L89899 Pressure ulcer of other site, unspecified stage: Secondary | ICD-10-CM | POA: Diagnosis not present

## 2022-09-07 DIAGNOSIS — M25562 Pain in left knee: Secondary | ICD-10-CM | POA: Diagnosis not present

## 2022-10-04 DIAGNOSIS — M79674 Pain in right toe(s): Secondary | ICD-10-CM | POA: Diagnosis not present

## 2022-10-04 DIAGNOSIS — M2041 Other hammer toe(s) (acquired), right foot: Secondary | ICD-10-CM | POA: Diagnosis not present

## 2022-10-07 DIAGNOSIS — C61 Malignant neoplasm of prostate: Secondary | ICD-10-CM | POA: Diagnosis not present

## 2022-10-28 DIAGNOSIS — M25562 Pain in left knee: Secondary | ICD-10-CM | POA: Diagnosis not present

## 2022-10-28 DIAGNOSIS — M7042 Prepatellar bursitis, left knee: Secondary | ICD-10-CM | POA: Diagnosis not present

## 2022-10-28 DIAGNOSIS — M25462 Effusion, left knee: Secondary | ICD-10-CM | POA: Diagnosis not present

## 2022-11-08 DIAGNOSIS — N5235 Erectile dysfunction following radiation therapy: Secondary | ICD-10-CM | POA: Diagnosis not present

## 2022-11-08 DIAGNOSIS — R3915 Urgency of urination: Secondary | ICD-10-CM | POA: Diagnosis not present

## 2022-11-08 DIAGNOSIS — C61 Malignant neoplasm of prostate: Secondary | ICD-10-CM | POA: Diagnosis not present

## 2022-11-17 DIAGNOSIS — H401131 Primary open-angle glaucoma, bilateral, mild stage: Secondary | ICD-10-CM | POA: Diagnosis not present

## 2022-11-17 DIAGNOSIS — H2513 Age-related nuclear cataract, bilateral: Secondary | ICD-10-CM | POA: Diagnosis not present

## 2022-11-17 DIAGNOSIS — H25013 Cortical age-related cataract, bilateral: Secondary | ICD-10-CM | POA: Diagnosis not present

## 2022-12-23 DIAGNOSIS — M170 Bilateral primary osteoarthritis of knee: Secondary | ICD-10-CM | POA: Diagnosis not present

## 2022-12-23 DIAGNOSIS — M7042 Prepatellar bursitis, left knee: Secondary | ICD-10-CM | POA: Diagnosis not present

## 2023-02-23 ENCOUNTER — Encounter (HOSPITAL_COMMUNITY): Payer: Self-pay

## 2023-02-23 ENCOUNTER — Emergency Department (HOSPITAL_COMMUNITY)
Admission: EM | Admit: 2023-02-23 | Discharge: 2023-02-24 | Disposition: A | Discharge status: Home / Self Care | Payer: BC Managed Care – PPO | Attending: Emergency Medicine | Admitting: Emergency Medicine

## 2023-02-23 DIAGNOSIS — R600 Localized edema: Secondary | ICD-10-CM | POA: Diagnosis not present

## 2023-02-23 DIAGNOSIS — I8390 Asymptomatic varicose veins of unspecified lower extremity: Secondary | ICD-10-CM | POA: Diagnosis not present

## 2023-02-23 DIAGNOSIS — Z8679 Personal history of other diseases of the circulatory system: Secondary | ICD-10-CM | POA: Diagnosis not present

## 2023-02-23 DIAGNOSIS — M7989 Other specified soft tissue disorders: Secondary | ICD-10-CM

## 2023-02-23 DIAGNOSIS — Z8546 Personal history of malignant neoplasm of prostate: Secondary | ICD-10-CM | POA: Diagnosis not present

## 2023-02-23 DIAGNOSIS — Z7982 Long term (current) use of aspirin: Secondary | ICD-10-CM | POA: Diagnosis not present

## 2023-02-23 DIAGNOSIS — I10 Essential (primary) hypertension: Secondary | ICD-10-CM | POA: Insufficient documentation

## 2023-02-23 DIAGNOSIS — J9811 Atelectasis: Secondary | ICD-10-CM | POA: Diagnosis not present

## 2023-02-23 DIAGNOSIS — M25473 Effusion, unspecified ankle: Secondary | ICD-10-CM | POA: Insufficient documentation

## 2023-02-23 DIAGNOSIS — Z79899 Other long term (current) drug therapy: Secondary | ICD-10-CM | POA: Insufficient documentation

## 2023-02-23 DIAGNOSIS — R2240 Localized swelling, mass and lump, unspecified lower limb: Secondary | ICD-10-CM | POA: Diagnosis not present

## 2023-02-23 NOTE — ED Triage Notes (Addendum)
Pt arrived POV for left leg swelling that started over the weekend and has progressed. +1 pitting edema noted and taunt. No hx of CHF or on fluid medications. Denies pain to LLE, no injury noted. Wife reports pt eats a lot of salt. Pt denies SOB or CP, pt is ambulatory to triage. No warmth or redness noted to LLE. VSS, NAD noted.

## 2023-02-24 ENCOUNTER — Ambulatory Visit (HOSPITAL_BASED_OUTPATIENT_CLINIC_OR_DEPARTMENT_OTHER)
Admission: RE | Admit: 2023-02-24 | Discharge: 2023-02-24 | Disposition: A | Discharge status: Home / Self Care | Payer: BC Managed Care – PPO | Source: Ambulatory Visit | Attending: Emergency Medicine | Admitting: Emergency Medicine

## 2023-02-24 ENCOUNTER — Emergency Department (HOSPITAL_COMMUNITY): Payer: BC Managed Care – PPO

## 2023-02-24 DIAGNOSIS — M7989 Other specified soft tissue disorders: Secondary | ICD-10-CM | POA: Diagnosis not present

## 2023-02-24 DIAGNOSIS — Z8679 Personal history of other diseases of the circulatory system: Secondary | ICD-10-CM | POA: Insufficient documentation

## 2023-02-24 DIAGNOSIS — J9811 Atelectasis: Secondary | ICD-10-CM | POA: Diagnosis not present

## 2023-02-24 DIAGNOSIS — I8390 Asymptomatic varicose veins of unspecified lower extremity: Secondary | ICD-10-CM | POA: Insufficient documentation

## 2023-02-24 DIAGNOSIS — M25473 Effusion, unspecified ankle: Secondary | ICD-10-CM | POA: Insufficient documentation

## 2023-02-24 DIAGNOSIS — R600 Localized edema: Secondary | ICD-10-CM | POA: Diagnosis not present

## 2023-02-24 LAB — TSH: TSH: 3.116 u[IU]/mL (ref 0.350–4.500)

## 2023-02-24 LAB — CBC WITH DIFFERENTIAL/PLATELET
Abs Immature Granulocytes: 0.02 10*3/uL (ref 0.00–0.07)
Basophils Absolute: 0.1 10*3/uL (ref 0.0–0.1)
Basophils Relative: 1 %
Eosinophils Absolute: 0.3 10*3/uL (ref 0.0–0.5)
Eosinophils Relative: 7 %
HCT: 40.8 % (ref 39.0–52.0)
Hemoglobin: 13.3 g/dL (ref 13.0–17.0)
Immature Granulocytes: 0 %
Lymphocytes Relative: 33 %
Lymphs Abs: 1.5 10*3/uL (ref 0.7–4.0)
MCH: 28.1 pg (ref 26.0–34.0)
MCHC: 32.6 g/dL (ref 30.0–36.0)
MCV: 86.1 fL (ref 80.0–100.0)
Monocytes Absolute: 0.8 10*3/uL (ref 0.1–1.0)
Monocytes Relative: 17 %
Neutro Abs: 2 10*3/uL (ref 1.7–7.7)
Neutrophils Relative %: 42 %
Platelets: 181 10*3/uL (ref 150–400)
RBC: 4.74 MIL/uL (ref 4.22–5.81)
RDW: 14.5 % (ref 11.5–15.5)
WBC: 4.6 10*3/uL (ref 4.0–10.5)
nRBC: 0 % (ref 0.0–0.2)

## 2023-02-24 LAB — BASIC METABOLIC PANEL
Anion gap: 8 (ref 5–15)
BUN: 18 mg/dL (ref 8–23)
CO2: 26 mmol/L (ref 22–32)
Calcium: 8.5 mg/dL — ABNORMAL LOW (ref 8.9–10.3)
Chloride: 101 mmol/L (ref 98–111)
Creatinine, Ser: 1.03 mg/dL (ref 0.61–1.24)
GFR, Estimated: >60 mL/min (ref >60)
Glucose, Bld: 110 mg/dL — ABNORMAL HIGH (ref 70–99)
Potassium: 3.6 mmol/L (ref 3.5–5.1)
Sodium: 135 mmol/L (ref 135–145)

## 2023-02-24 LAB — T4, FREE: Free T4: 0.73 ng/dL (ref 0.61–1.12)

## 2023-02-24 LAB — BRAIN NATRIURETIC PEPTIDE: B Natriuretic Peptide: 11.7 pg/mL (ref 0.0–100.0)

## 2023-02-24 MED ORDER — ENOXAPARIN SODIUM 100 MG/ML IJ SOSY
1.0000 mg/kg | PREFILLED_SYRINGE | Freq: Once | INTRAMUSCULAR | Status: AC
  Administered 2023-02-24: 97.5 mg via SUBCUTANEOUS
  Filled 2023-02-24: qty 1

## 2023-02-24 NOTE — ED Provider Notes (Signed)
[place] EMERGENCY DEPARTMENT AT [facility] Provider Note  CSN: [id number] Arrival date & time: 02/23/23 2249  Chief Complaint(s) Leg Swelling  HPI Glen Freeman is a 72 y.o. male with past medical history as below, significant for GERD, hypertension, left leg saphenous varicosity,  thrombophlebitis lower extremity on Coumadin x 6 months, no longer on Coumadin who presents to the ED with complaint of leg swelling.  He is accompanied by spouse, reports leg swelling over the past 4 to 5 days, gradually worsening.  No associated dyspnea or chest pain.  No recent travel or overnight hospitalization, reports history of prior blood clot in his leg left leg.  No dyspnea or chest pain.  No history of PE.  No recent medication or diet changes.  He takes daily aspirin 81 mg.  Denies trauma, no weakness to extremities.  Orthopnea or exertional dyspnea, no chest pain.  Past Medical History Past Medical History:  Diagnosis Date   Allergic rhinitis    Bowel obstruction (HCC)    Diverticulosis    GERD (gastroesophageal reflux disease)    Glaucoma    History of kidney stones    History of thrombophlebitis    2010  superficial lower extremity,  completed 6 months coumadin   Hypertension    followed by pcp   (08-23-2019 per has never had a stress test)   Nocturia    Prostate cancer [facility]) urologist-- dr winter/ oncologist-- dr Kathrynn Running   dx 05-24-2019--- Stage T1c,  Gleason 4+3   Venous insufficiency    Left  leg; with saphenous varicositis,    Patient Active Problem List   Diagnosis Date Noted   SBO (small bowel obstruction) (HCC) 07/28/2021   Diverticulitis with obstruction (HCC) 07/28/2021   Malignant neoplasm of prostate (HCC) 06/19/2019   Popping of left ear 06/22/2016   KNEE PAIN, LEFT, ACUTE 05/02/2010   Essential hypertension 01/13/2010   VIRAL URI 01/13/2010   ALLERGIC RHINITIS 02/14/2009   DVT 11/15/2008   THROMBOSIS, 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 Authorizing Provider  alum & mag hydroxide-simeth (MAALOX/MYLANTA) 200-200-20 MG/5ML suspension Take 15 mLs by mouth every 4 (four) hours as needed for indigestion or heartburn. 08/04/21   Glade Lloyd, MD  amLODipine (NORVASC) 5 MG tablet Take 5 mg by mouth at bedtime.  04/21/19   [provider]  Ascorbic Acid (VITAMIN C) 1000 MG tablet Take 1,000 mg by mouth daily.    [provider]  ibuprofen (ADVIL,MOTRIN) 200 MG tablet Take 200 mg by mouth every 6 (six) hours as needed for mild pain.    [provider]  Lansoprazole (PREVACID PO) Take by mouth.    [provider]  latanoprost (XALATAN) 0.005 % ophthalmic solution 1 drop at bedtime. 09/10/21   [provider]  magnesium 30 MG tablet Take 30 mg by mouth 2 (two) times daily. Patient not taking: Reported on 09/29/2021    [provider]  Multiple Vitamin (MULTIVITAMIN) tablet Take 1 tablet by mouth daily.    [provider]  ondansetron (ZOFRAN) 4 MG tablet Take 1 tablet (4 mg total) by mouth every 6 (six) hours as needed for nausea. Patient not taking: Reported on 09/29/2021 08/04/21   Glade Lloyd, MD  polyethylene glycol (MIRALAX / GLYCOLAX) 17 g packet Take 17 g by mouth daily. 1 capful in 8 ounces of water a day  [provider]  promethazine (PHENERGAN) 25 MG suppository Place 1 suppository (25 mg total) rectally every 6 (six) hours as needed for nausea or vomiting. Patient not taking: Reported on 09/29/2021 07/26/21   Derwood Kaplan, MD  saccharomyces boulardii (FLORASTOR) 250 MG capsule Take 250 mg by mouth daily.    [provider]  tamsulosin (FLOMAX) 0.4 MG CAPS capsule Take 0.4 mg by mouth at bedtime. 06/13/21   [provider]                                                                                                                                     Past Surgical History Past Surgical History:  Procedure Laterality Date   COLONOSCOPY  last one 05-12-2016   CYSTOSCOPY N/A 08/24/2019   Procedure: CYSTOSCOPY;  Surgeon: Rene Paci, MD;  Location: [facility];  Service: Urology;  Laterality: N/A;   endovascular laser ablation  2010   of veins in Left  leg per Dr. Hart Rochester   KNEE RECONSTRUCTION Right 1970   football injury   RADIOACTIVE SEED IMPLANT N/A 08/24/2019   Procedure: RADIOACTIVE SEED IMPLANT/BRACHYTHERAPY IMPLANT;  Surgeon: Rene Paci, MD;  Location: [facility];  Service: Urology;  Laterality: N/A;  only needs 90 min for all procedures   SPACE OAR INSTILLATION N/A 08/24/2019   Procedure: SPACE OAR INSTILLATION;  Surgeon: Rene Paci, MD;  Location: [facility];  Service: Urology;  Laterality: N/A;   Family History Family History  Problem Relation Age of Onset   Breast cancer Other        family hx - 1st degree relatvie <50   Diabetes Other        1st degree relative   Hypertension Other    Prostate cancer Other        1st degree relative <50    Colon cancer Neg Hx    Colon polyps Neg Hx    Esophageal cancer Neg Hx    Rectal cancer Neg Hx    Stomach cancer Neg Hx     Social History Social History   Tobacco Use   Smoking status: Never   Smokeless tobacco: Never  Vaping Use   Vaping Use: Never used  Substance Use Topics   Alcohol use: Not Currently    Alcohol/week: 0.0 standard drinks of alcohol    Comment: occasional   Drug use: Never   Allergies Patient has no known allergies.  Review of Systems Review of Systems  Constitutional:  Negative for chills and fever.  HENT:  Negative for facial swelling and trouble swallowing.   Eyes:  Negative for photophobia and visual disturbance.  Respiratory:  Negative for cough and shortness of breath.   Cardiovascular:  Positive for leg  swelling. Negative for chest pain and palpitations.  Gastrointestinal:  Negative for abdominal pain, nausea and vomiting.  Endocrine: Negative for polydipsia and polyuria.  Genitourinary:  Negative for difficulty urinating and hematuria.  Musculoskeletal:  Negative for gait problem and joint swelling.  Skin:  Negative for pallor and rash.  Neurological:  Negative for syncope and headaches.  Psychiatric/Behavioral:  Negative for agitation and confusion.     Physical Exam Vital Signs  I have reviewed the triage vital signs BP (!) 141/91 (BP Location: Right Arm)   Pulse 72   Temp 98.2 F (36.8 C) (Oral)   Resp 18   Ht 5\' 7"  (1.702 m)   Wt 97.5 kg   SpO2 99%   BMI 33.67 kg/m  Physical Exam Vitals and nursing note reviewed.  Constitutional:      General: He is not in acute distress.    Appearance: He is well-developed. He is not ill-appearing.  HENT:     Head: Normocephalic and atraumatic.     Right Ear: External ear normal.     Left Ear: External ear normal.     Mouth/Throat:     Mouth: Mucous membranes are moist.  Eyes:     General: No scleral icterus. Cardiovascular:     Rate and Rhythm: Normal rate and regular rhythm.     Pulses: Normal pulses.     Heart sounds: Normal heart sounds.  Pulmonary:     Effort: Pulmonary effort is normal. No respiratory distress.     Breath sounds: Normal breath sounds.  Abdominal:     General: Abdomen is flat.     Palpations: Abdomen is soft.     Tenderness: There is no abdominal tenderness.  Musculoskeletal:        General: No tenderness.     Right lower leg: No edema.     Left lower leg: Edema present.     Comments: Varicose veins noted LLE  LE NVI  Gait steady  Skin:    General: Skin is warm and dry.     Capillary Refill: Capillary refill takes less than 2 seconds.  Neurological:     Mental Status: He is alert and oriented to person, place, and time.  Psychiatric:        Mood and Affect: Mood normal.        Behavior:  Behavior normal.     ED Results and Treatments Labs (all labs ordered are listed, but only abnormal results are displayed) Labs Reviewed  BASIC METABOLIC PANEL - Abnormal; Notable for the following components:      Result Value   Glucose, Bld 110 (*)    Calcium 8.5 (*)    All other components within normal limits  BRAIN NATRIURETIC PEPTIDE  CBC WITH DIFFERENTIAL/PLATELET  TSH  T4, FREE                                                                                                                          Radiology DG Chest Port 1 View  Result Date: 02/24/2023 CLINICAL DATA:  Leg swelling EXAM: PORTABLE CHEST 1 VIEW COMPARISON:  07/28/2021 FINDINGS: No acute airspace disease  or effusion. Minimal atelectasis left base. Normal cardiomediastinal silhouette. No pneumothorax IMPRESSION: No active disease. Minimal atelectasis at the left base. Electronically Signed   By: Jasmine Pang M.D.   On: 02/24/2023 00:31    Pertinent labs & imaging results that were available during my care of the patient were reviewed by me and considered in my medical decision making (see MDM for details).  Medications Ordered in ED Medications  enoxaparin (LOVENOX) injection 97.5 mg (has no administration in time range)                                                                                                                                     Procedures Procedures  (including critical care time)  Medical Decision Making / ED Course    Medical Decision Making:    Karanveer Ramakrishnan is a 72 y.o. male with past medical history as below, significant for GERD, hypertension, left leg saphenous varicosity,  thrombophlebitis lower extremity on Coumadin x 6 months, no longer on Coumadin who presents to the ED with complaint of leg swelling.. The complaint involves an extensive differential diagnosis and also carries with it a high risk of complications and morbidity.  Serious etiology was considered. Ddx  includes but is not limited to: DVT, thrombophlebitis, cellulitis, fluid overload/chf, etc  Complete initial physical exam performed, notably the patient  was nad, resting comfortably.    Reviewed and confirmed nursing documentation for past medical history, family history, social history.  Vital signs reviewed.      Pt here with LLE swelling, no evidence of PE on exam Labs are stable, does not appear to be acute CHF, does not appear to be volume overloaded on exam. Thyroid labs pending Clinically He is very well-appearing, vital signs are stable, breathing comfortably ambient air, no chest pain or dyspnea. Vascular tech not available overnight, advised patient to return tomorrow either here or at [facility] for duplex. Give Lovenox, discussed bleeding contraindications and risk factors w/ pt and spouse   The patient improved significantly and was discharged in stable condition. Detailed discussions were had with the patient regarding current findings, and need for close f/u with PCP or on call doctor. The patient has been instructed to return immediately if the symptoms worsen in any way for re-evaluation. Patient verbalized understanding and is in agreement with current care plan. All questions answered prior to discharge.      Additional history obtained: -Additional history obtained from spouse -External records from outside source obtained and reviewed including: Chart review including previous notes, labs, imaging, consultation notes including home medications, primary care documentation, prior labs and   Lab Tests: -I ordered, reviewed, and interpreted labs.   The pertinent results include:   Labs Reviewed  BASIC METABOLIC PANEL - Abnormal; Notable for the following components:      Result Value   Glucose, Bld 110 (*)    Calcium 8.5 (*)  All other components within normal limits  BRAIN NATRIURETIC PEPTIDE  CBC WITH DIFFERENTIAL/PLATELET  TSH  T4, FREE    Notable for  labs stable, thyroid studies,  EKG   EKG Interpretation  Date/Time:    Ventricular Rate:    PR Interval:    QRS Duration:   QT Interval:    QTC Calculation:   R Axis:     Text Interpretation:           Imaging Studies ordered: I ordered imaging studies including cxr I independently visualized the following imaging with scope of interpretation limited to determining acute life threatening conditions related to emergency care; findings noted above, significant for atorvastatin I independently visualized and interpreted imaging. I agree with the radiologist interpretation   Medicines ordered and prescription drug management: Meds ordered this encounter  Medications   enoxaparin (LOVENOX) injection 97.5 mg    -I have reviewed the patients home medicines and have made adjustments as needed   Consultations Obtained: na   Cardiac Monitoring: Na  Pulse ox 99-100%   Social Determinants of Health:  Diagnosis or treatment significantly limited by social determinants of health: obesity   Reevaluation: After the interventions noted above, I reevaluated the patient and found that they have stayed the same  Co morbidities that complicate the patient evaluation  Past Medical History:  Diagnosis Date   Allergic rhinitis    Bowel obstruction (HCC)    Diverticulosis    GERD (gastroesophageal reflux disease)    Glaucoma    History of kidney stones    History of thrombophlebitis    2010  superficial lower extremity,  completed 6 months coumadin   Hypertension    followed by pcp   (08-23-2019 per has never had a stress test)   Nocturia    Prostate cancer [facility]) urologist-- dr winter/ oncologist-- dr Kathrynn Running   dx 05-24-2019--- Stage T1c,  Gleason 4+3   Venous insufficiency    Left  leg; with saphenous varicositis,       Dispostion: Disposition decision including need for hospitalization was considered, and patient discharged from emergency department.    Final  Clinical Impression(s) / ED Diagnoses Final diagnoses:  Leg swelling     This chart was dictated using voice recognition software.  Despite best efforts to proofread,  errors can occur which can change the documentation meaning.    Sloan Leiter, DO 02/24/23 0205

## 2023-02-24 NOTE — ED Notes (Signed)
US PIV placed. 

## 2023-02-24 NOTE — Discharge Instructions (Addendum)
You have some swelling in your left leg concerning for possible blood clot, please go to the [facility] emergency department later on today to receive your lower extremity ultrasound to evaluate for possible blood clot.  If you develop difficulty breathing, worsening pain or swelling to your leg, any worsening worrisome symptoms please return to the emergency department for repeat evaluation.  It was a pleasure caring for you today in the emergency department.  Please return to the emergency department for any worsening or worrisome symptoms.

## 2023-02-24 NOTE — ED Notes (Signed)
Patient given discharge instructions and follow up care. Patient verbalized understanding. IV removed with catheter intact. Patient ambulatory out of ED. 

## 2023-02-25 DIAGNOSIS — M7989 Other specified soft tissue disorders: Secondary | ICD-10-CM | POA: Diagnosis not present

## 2023-02-25 DIAGNOSIS — I8390 Asymptomatic varicose veins of unspecified lower extremity: Secondary | ICD-10-CM | POA: Diagnosis not present

## 2023-02-25 DIAGNOSIS — M170 Bilateral primary osteoarthritis of knee: Secondary | ICD-10-CM | POA: Diagnosis not present

## 2023-02-25 DIAGNOSIS — I10 Essential (primary) hypertension: Secondary | ICD-10-CM | POA: Diagnosis not present

## 2023-03-03 DIAGNOSIS — M170 Bilateral primary osteoarthritis of knee: Secondary | ICD-10-CM | POA: Diagnosis not present

## 2023-03-09 DIAGNOSIS — M170 Bilateral primary osteoarthritis of knee: Secondary | ICD-10-CM | POA: Diagnosis not present

## 2023-04-12 ENCOUNTER — Other Ambulatory Visit: Payer: Self-pay | Admitting: *Deleted

## 2023-04-12 DIAGNOSIS — M79605 Pain in left leg: Secondary | ICD-10-CM

## 2023-04-22 ENCOUNTER — Ambulatory Visit (HOSPITAL_COMMUNITY)
Admission: RE | Admit: 2023-04-22 | Discharge: 2023-04-22 | Disposition: A | Discharge status: Home / Self Care | Payer: BC Managed Care – PPO | Source: Ambulatory Visit | Attending: Vascular Surgery | Admitting: Vascular Surgery

## 2023-04-22 ENCOUNTER — Ambulatory Visit (INDEPENDENT_AMBULATORY_CARE_PROVIDER_SITE_OTHER): Payer: BC Managed Care – PPO | Admitting: Physician Assistant

## 2023-04-22 VITALS — BP 136/83 | HR 66 | Temp 97.9°F | Resp 18 | Ht 67.0 in | Wt 228.0 lb

## 2023-04-22 DIAGNOSIS — M79604 Pain in right leg: Secondary | ICD-10-CM | POA: Diagnosis not present

## 2023-04-22 DIAGNOSIS — M79605 Pain in left leg: Secondary | ICD-10-CM | POA: Diagnosis not present

## 2023-04-22 DIAGNOSIS — I872 Venous insufficiency (chronic) (peripheral): Secondary | ICD-10-CM | POA: Diagnosis not present

## 2023-04-22 NOTE — Progress Notes (Signed)
Office Note     CC:  follow up Requesting Provider:  Alysia Penna, MD  HPI: Glen Freeman is a 72 y.o. (07-22-1951) male who presents for evaluation of left leg edema.  He has history of left greater saphenous vein ablation and stab phlebectomy by Dr. Hart Rochester in 2011.  He recently noticed more edema in his left leg after changing shifts at work.  Since making the appointment with our office he has been trying to get more sleep which has been helping with edema.  He feels his leg has been returning to normal size overnight now that he is sleeping through the night.  He denies any history of DVT, venous ulcerations, trauma.  He is wearing knee-high compression while working.  He is elevating his legs when he returns home.  He is trying to avoid prolonged sitting and standing however his job requires him to be on his feet and sometimes standing for long periods of time.   Past Medical History:  Diagnosis Date   Allergic rhinitis    Bowel obstruction (HCC)    Diverticulosis    GERD (gastroesophageal reflux disease)    Glaucoma    History of kidney stones    History of thrombophlebitis    2010  superficial lower extremity,  completed 6 months coumadin   Hypertension    followed by pcp   (08-23-2019 per has never had a stress test)   Nocturia    Prostate cancer [facility]) urologist-- dr winter/ oncologist-- dr Kathrynn Running   dx 05-24-2019--- Stage T1c,  Gleason 4+3   Venous insufficiency    Left  leg; with saphenous varicositis,     Past Surgical History:  Procedure Laterality Date   COLONOSCOPY  last one 05-12-2016   CYSTOSCOPY N/A 08/24/2019   Procedure: CYSTOSCOPY;  Surgeon: Rene Paci, MD;  Location: [facility];  Service: Urology;  Laterality: N/A;   endovascular laser ablation  2010   of veins in Left  leg per Dr. Hart Rochester   KNEE RECONSTRUCTION Right 1970   football injury   RADIOACTIVE SEED IMPLANT N/A 08/24/2019   Procedure: RADIOACTIVE SEED  IMPLANT/BRACHYTHERAPY IMPLANT;  Surgeon: Rene Paci, MD;  Location: [facility];  Service: Urology;  Laterality: N/A;  only needs 90 min for all procedures   SPACE OAR INSTILLATION N/A 08/24/2019   Procedure: SPACE OAR INSTILLATION;  Surgeon: Rene Paci, MD;  Location: [facility];  Service: Urology;  Laterality: N/A;    Social History   Socioeconomic History   Marital status: Married    Spouse name: Not on file   Number of children: 5   Years of education: Not on file   Highest education level: Not on file  Occupational History    Comment: full time  Tobacco Use   Smoking status: Never   Smokeless tobacco: Never  Vaping Use   Vaping Use: Never used  Substance and Sexual Activity   Alcohol use: Not Currently    Alcohol/week: 0.0 standard drinks of alcohol    Comment: occasional   Drug use: Never   Sexual activity: Not on file  Other Topics Concern   Not on file  Social History Narrative   Married. Has 2 daughters and 3 sons. NKDA. Resides in [place], [place]. Works as a Advertising account planner.   Social Determinants of Health   Financial Resource Strain: Not on file  Food Insecurity: Not on file  Transportation Needs: Not on file  Physical Activity: Not on  file  Stress: Not on file  Social Connections: Not on file  Intimate Partner Violence: Not on file    Family History  Problem Relation Age of Onset   Breast cancer Other        family hx - 1st degree relatvie <50   Diabetes Other        1st degree relative   Hypertension Other    Prostate cancer Other        1st degree relative <50    Colon cancer Neg Hx    Colon polyps Neg Hx    Esophageal cancer Neg Hx    Rectal cancer Neg Hx    Stomach cancer Neg Hx     Current Outpatient Medications  Medication Sig Dispense Refill   alum & mag hydroxide-simeth (MAALOX/MYLANTA) 200-200-20 MG/5ML suspension Take 15 mLs by mouth every 4 (four) hours as needed for indigestion or  heartburn. 355 mL 0   amLODipine (NORVASC) 5 MG tablet Take 5 mg by mouth at bedtime.      Ascorbic Acid (VITAMIN C) 1000 MG tablet Take 1,000 mg by mouth daily.     ibuprofen (ADVIL,MOTRIN) 200 MG tablet Take 200 mg by mouth every 6 (six) hours as needed for mild pain.     Lansoprazole (PREVACID PO) Take by mouth.     latanoprost (XALATAN) 0.005 % ophthalmic solution 1 drop at bedtime.     Multiple Vitamin (MULTIVITAMIN) tablet Take 1 tablet by mouth daily.     polyethylene glycol (MIRALAX / GLYCOLAX) 17 g packet Take 17 g by mouth daily. 1 capful in 8 ounces of water a day     tamsulosin (FLOMAX) 0.4 MG CAPS capsule Take 0.4 mg by mouth at bedtime.     magnesium 30 MG tablet Take 30 mg by mouth 2 (two) times daily. (Patient not taking: Reported on 09/29/2021)     ondansetron (ZOFRAN) 4 MG tablet Take 1 tablet (4 mg total) by mouth every 6 (six) hours as needed for nausea. (Patient not taking: Reported on 09/29/2021) 20 tablet 0   promethazine (PHENERGAN) 25 MG suppository Place 1 suppository (25 mg total) rectally every 6 (six) hours as needed for nausea or vomiting. (Patient not taking: Reported on 09/29/2021) 12 each 0   saccharomyces boulardii (FLORASTOR) 250 MG capsule Take 250 mg by mouth daily. (Patient not taking: Reported on 04/22/2023)     No current facility-administered medications for this visit.    No Known Allergies   REVIEW OF SYSTEMS:   [X]  denotes positive finding, [ ]  denotes negative finding Cardiac  Comments:  Chest pain or chest pressure:    Shortness of breath upon exertion:    Short of breath when lying flat:    Irregular heart rhythm:        Vascular    Pain in calf, thigh, or hip brought on by ambulation:    Pain in feet at night that wakes you up from your sleep:     Blood clot in your veins:    Leg swelling:         Pulmonary    Oxygen at home:    Productive cough:     Wheezing:         Neurologic    Sudden weakness in arms or legs:     Sudden  numbness in arms or legs:     Sudden onset of difficulty speaking or slurred speech:    Temporary loss of vision in one eye:  Problems with dizziness:         Gastrointestinal    Blood in stool:     Vomited blood:         Genitourinary    Burning when urinating:     Blood in urine:        Psychiatric    Major depression:         Hematologic    Bleeding problems:    Problems with blood clotting too easily:        Skin    Rashes or ulcers:        Constitutional    Fever or chills:      PHYSICAL EXAMINATION:  Vitals:   04/22/23 1210  BP: 136/83  Pulse: 66  Resp: 18  Temp: 97.9 F (36.6 C)  TempSrc: Temporal  SpO2: 99%  Weight: 228 lb (103.4 kg)  Height: 5\' 7"  (1.702 m)    General:  WDWN in NAD; vital signs documented above Gait: Not observed HENT: WNL, normocephalic Pulmonary: normal non-labored breathing , without Rales, rhonchi,  wheezing Cardiac: regular HR Abdomen: soft, NT, no masses Skin: without rashes Vascular Exam/Pulses: palpable DP pulses Extremities: without ischemic changes, without Gangrene , without cellulitis; without open wounds; LL edema to the level of the mid shin; varicosities distal medial thigh; pigmentation changes but no wounds Musculoskeletal: no muscle wasting or atrophy  Neurologic: A&O X 3 Psychiatric:  The pt has Normal affect.   Non-Invasive Vascular Imaging:   Left lower extremity venous reflux study negative for DVT Incompetent common femoral vein Findings consistent with prior greater saphenous vein ablation/stripping    ASSESSMENT/PLAN:: 72 y.o. male here for follow up for evaluation of left lower leg edema  -By the time of the appointment, the patient states his edema has been improving after getting more sleep at night.  Left lower extremity venous reflux study negative for DVT.  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  Findings on duplex are also consistent with prior vein ablation.  He will continue  wearing his knee-high compression on a daily basis while working.  He should focus on periodic elevation of the legs when possible during the day especially when returning home from work.  He will do his best to avoid standing for long periods of time at work.  He has no evidence of arterial insufficiency with palpable DP pulses.  He will follow-up on an as-needed basis.   Emilie Rutter, PA-C [facility] [phone]  Clinic MD:   Karin Lieu

## 2023-05-23 DIAGNOSIS — H401131 Primary open-angle glaucoma, bilateral, mild stage: Secondary | ICD-10-CM | POA: Diagnosis not present

## 2023-06-03 DIAGNOSIS — M1712 Unilateral primary osteoarthritis, left knee: Secondary | ICD-10-CM | POA: Diagnosis not present

## 2023-06-03 DIAGNOSIS — R7989 Other specified abnormal findings of blood chemistry: Secondary | ICD-10-CM | POA: Diagnosis not present

## 2023-06-03 DIAGNOSIS — I10 Essential (primary) hypertension: Secondary | ICD-10-CM | POA: Diagnosis not present

## 2023-06-03 DIAGNOSIS — Z125 Encounter for screening for malignant neoplasm of prostate: Secondary | ICD-10-CM | POA: Diagnosis not present

## 2023-06-03 DIAGNOSIS — M170 Bilateral primary osteoarthritis of knee: Secondary | ICD-10-CM | POA: Diagnosis not present

## 2023-06-06 DIAGNOSIS — Z0000 Encounter for general adult medical examination without abnormal findings: Secondary | ICD-10-CM | POA: Diagnosis not present

## 2023-06-06 DIAGNOSIS — Z1339 Encounter for screening examination for other mental health and behavioral disorders: Secondary | ICD-10-CM | POA: Diagnosis not present

## 2023-06-06 DIAGNOSIS — I10 Essential (primary) hypertension: Secondary | ICD-10-CM | POA: Diagnosis not present

## 2023-06-06 DIAGNOSIS — R82998 Other abnormal findings in urine: Secondary | ICD-10-CM | POA: Diagnosis not present

## 2023-06-06 DIAGNOSIS — M7989 Other specified soft tissue disorders: Secondary | ICD-10-CM | POA: Diagnosis not present

## 2023-06-06 DIAGNOSIS — Z1331 Encounter for screening for depression: Secondary | ICD-10-CM | POA: Diagnosis not present

## 2023-06-06 DIAGNOSIS — Z23 Encounter for immunization: Secondary | ICD-10-CM | POA: Diagnosis not present

## 2023-06-08 ENCOUNTER — Encounter (HOSPITAL_COMMUNITY): Payer: Self-pay

## 2023-06-08 ENCOUNTER — Emergency Department (HOSPITAL_COMMUNITY)
Admission: EM | Admit: 2023-06-08 | Discharge: 2023-06-09 | Disposition: A | Discharge status: Home / Self Care | Payer: BC Managed Care – PPO | Attending: Emergency Medicine | Admitting: Emergency Medicine

## 2023-06-08 ENCOUNTER — Other Ambulatory Visit: Payer: Self-pay

## 2023-06-08 DIAGNOSIS — M7052 Other bursitis of knee, left knee: Secondary | ICD-10-CM | POA: Insufficient documentation

## 2023-06-08 DIAGNOSIS — Y9389 Activity, other specified: Secondary | ICD-10-CM | POA: Insufficient documentation

## 2023-06-08 DIAGNOSIS — M25462 Effusion, left knee: Secondary | ICD-10-CM | POA: Diagnosis not present

## 2023-06-08 MED ORDER — DICLOFENAC SODIUM 1 % EX GEL: 2.0000 g | Freq: Four times a day (QID) | CUTANEOUS | 0 refills | Status: AC

## 2023-06-08 MED ORDER — DICLOFENAC SODIUM 1 % EX GEL
2.0000 g | Freq: Once | CUTANEOUS | Status: DC
  Filled 2023-06-08: qty 100

## 2023-06-08 NOTE — ED Provider Notes (Incomplete)
  [place] EMERGENCY DEPARTMENT AT [facility] Provider Note   CSN: [id number] Arrival date & time: 06/08/23  2149     History {Add pertinent medical, surgical, social history, OB history to HPI:1} Chief Complaint  Patient presents with  . Joint Swelling    Keywan Reau is a 72 y.o. male.  HPI     Home Medications Prior to Admission medications   Medication Sig Start Date End Date Taking? Authorizing Provider  alum & mag hydroxide-simeth (MAALOX/MYLANTA) 200-200-20 MG/5ML suspension Take 15 mLs by mouth every 4 (four) hours as needed for indigestion or heartburn. 08/04/21   Glade Lloyd, MD  amLODipine (NORVASC) 5 MG tablet Take 5 mg by mouth at bedtime.  04/21/19   [provider]  Ascorbic Acid (VITAMIN C) 1000 MG tablet Take 1,000 mg by mouth daily.    [provider]  ibuprofen (ADVIL,MOTRIN) 200 MG tablet Take 200 mg by mouth every 6 (six) hours as needed for mild pain.    [provider]  Lansoprazole (PREVACID PO) Take by mouth.    [provider]  latanoprost (XALATAN) 0.005 % ophthalmic solution 1 drop at bedtime. 09/10/21   [provider]  Multiple Vitamin (MULTIVITAMIN) tablet Take 1 tablet by mouth daily.    [provider]  polyethylene glycol (MIRALAX / GLYCOLAX) 17 g packet Take 17 g by mouth daily. 1 capful in 8 ounces of water a day    [provider]  tamsulosin (FLOMAX) 0.4 MG CAPS capsule Take 0.4 mg by mouth at bedtime. 06/13/21   [provider]      Allergies    Patient has no known allergies.    Review of Systems   Review of Systems  Physical Exam Updated Vital Signs BP 131/82 (BP Location: Left Arm)   Pulse 77   Temp 97.8 F (36.6 C) (Oral)   Resp 17   Ht 5\' 7"  (1.702 m)   Wt 103.4 kg   SpO2 97%   BMI 35.70 kg/m  Physical Exam  ED Results / Procedures / Treatments   Labs (all labs ordered are listed, but only abnormal results are displayed) Labs  Reviewed - No data to display  EKG None  Radiology No results found.  Procedures Procedures  {Document cardiac monitor, telemetry assessment procedure when appropriate:1}  Medications Ordered in ED Medications - No data to display  ED Course/ Medical Decision Making/ A&P   {   Click here for ABCD2, HEART and other calculatorsREFRESH Note before signing :1}                              Medical Decision Making  ***  {Document critical care time when appropriate:1} {Document review of labs and clinical decision tools ie heart score, Chads2Vasc2 etc:1}  {Document your independent review of radiology images, and any outside records:1} {Document your discussion with family members, caretakers, and with consultants:1} {Document social determinants of health affecting pt's care:1} {Document your decision making why or why not admission, treatments were needed:1} Final Clinical Impression(s) / ED Diagnoses Final diagnoses:  None    Rx / DC Orders ED Discharge Orders     None

## 2023-06-08 NOTE — ED Triage Notes (Signed)
Pt has swelling to his left knee. He reports that he had it drained Friday and the fluid was back Saturday. Pt denies pain.

## 2023-06-08 NOTE — Discharge Instructions (Addendum)
Follow up with your orthopedic doctor. You can try Voltaren Gel for joint pain.

## 2023-06-09 NOTE — ED Provider Notes (Signed)
[place] EMERGENCY DEPARTMENT AT [facility] Provider Note   CSN: [id number] Arrival date & time: 06/08/23  2149     History  Chief Complaint  Patient presents with   Joint Swelling    Glen Freeman is a 72 y.o. male.  72 year old male presents with concern for left knee swelling. Patient has a history of patellar bursitis, seen by ortho last week for same with aspiration of the bursa. Patient states the knee swelling right back up. Wife called their insurance company who advised her needed emergent evaluation for this concern so patient came to the ER. He denies fevers, any pain in the knee or difficulty walking related to his left knee. States he has known arthritis in his right knee, has been told he needs a knee replacement.  He does wear compression socks periodically which do come up to his mid thigh.  He is mostly concerned about the appearance of the swelling of his knee.       Home Medications Prior to Admission medications   Medication Sig Start Date End Date Taking? Authorizing Provider  diclofenac Sodium (VOLTAREN) 1 % GEL Apply 2 g topically 4 (four) times daily. 06/08/23  Yes Jeannie Fend, PA-C  alum & mag hydroxide-simeth (MAALOX/MYLANTA) 200-200-20 MG/5ML suspension Take 15 mLs by mouth every 4 (four) hours as needed for indigestion or heartburn. 08/04/21   Glade Lloyd, MD  amLODipine (NORVASC) 5 MG tablet Take 5 mg by mouth at bedtime.  04/21/19   [provider]  Ascorbic Acid (VITAMIN C) 1000 MG tablet Take 1,000 mg by mouth daily.    [provider]  ibuprofen (ADVIL,MOTRIN) 200 MG tablet Take 200 mg by mouth every 6 (six) hours as needed for mild pain.    [provider]  Lansoprazole (PREVACID PO) Take by mouth.    [provider]  latanoprost (XALATAN) 0.005 % ophthalmic solution 1 drop at bedtime. 09/10/21   [provider]  Multiple Vitamin (MULTIVITAMIN) tablet Take 1 tablet by mouth daily.     [provider]  polyethylene glycol (MIRALAX / GLYCOLAX) 17 g packet Take 17 g by mouth daily. 1 capful in 8 ounces of water a day    [provider]  tamsulosin (FLOMAX) 0.4 MG CAPS capsule Take 0.4 mg by mouth at bedtime. 06/13/21   [provider]      Allergies    Patient has no known allergies.    Review of Systems   Review of Systems Negative except as per HPI Physical Exam Updated Vital Signs BP 131/82 (BP Location: Left Arm)   Pulse 77   Temp 97.8 F (36.6 C) (Oral)   Resp 17   Ht 5\' 7"  (1.702 m)   Wt 103.4 kg   SpO2 97%   BMI 35.70 kg/m  Physical Exam Vitals and nursing note reviewed.  Constitutional:      General: He is not in acute distress.    Appearance: He is well-developed. He is not diaphoretic.  HENT:     Head: Normocephalic and atraumatic.  Pulmonary:     Effort: Pulmonary effort is normal.  Musculoskeletal:        General: Swelling present. No tenderness, deformity or signs of injury.     Right lower leg: Edema present.     Left lower leg: Edema present.     Comments: Calf's non tender, no cords  Prepatellar bursitis left knee, able to flex to 90 degrees without pain or  difficulty   Skin:    General: Skin is warm and dry.     Findings: No erythema or rash.  Neurological:     Mental Status: He is alert and oriented to person, place, and time.  Psychiatric:        Behavior: Behavior normal.     ED Results / Procedures / Treatments   Labs (all labs ordered are listed, but only abnormal results are displayed) Labs Reviewed - No data to display  EKG None  Radiology No results found.  Procedures Procedures    Medications Ordered in ED Medications  diclofenac Sodium (VOLTAREN) 1 % topical gel 2 g (has no administration in time range)    ED Course/ Medical Decision Making/ A&P                                 Medical Decision Making  72 year old male presents with concern for swelling over his left knee  after having his bursa drained in clinic and Ortho last week.  He denies any fever, new pain or recent injuries.  He is concerned primarily about the appearance of the swelling of any.  It seems there is maybe a misunderstanding when wife called the insurance company earlier today regarding this concern.  Baseline bilateral pitting edema lower extremities, mild.  Calves are soft, nontender, no palpable cords.  Discussed with patient versus ask often swell after they have been draining.  Recommend follow-up with Ortho as scheduled.        Final Clinical Impression(s) / ED Diagnoses Final diagnoses:  Patellar bursitis, left    Rx / DC Orders ED Discharge Orders          Ordered    diclofenac Sodium (VOLTAREN) 1 % GEL  4 times daily        06/08/23 2358              Jeannie Fend, PA-C 06/09/23 0017    Shon Baton, MD 06/09/23 [phone]

## 2023-06-23 DIAGNOSIS — R051 Acute cough: Secondary | ICD-10-CM | POA: Diagnosis not present

## 2023-06-23 DIAGNOSIS — R0981 Nasal congestion: Secondary | ICD-10-CM | POA: Diagnosis not present

## 2023-06-23 DIAGNOSIS — Z1152 Encounter for screening for COVID-19: Secondary | ICD-10-CM | POA: Diagnosis not present

## 2023-06-23 DIAGNOSIS — R5383 Other fatigue: Secondary | ICD-10-CM | POA: Diagnosis not present

## 2023-06-23 DIAGNOSIS — J069 Acute upper respiratory infection, unspecified: Secondary | ICD-10-CM | POA: Diagnosis not present

## 2023-06-28 DIAGNOSIS — M7042 Prepatellar bursitis, left knee: Secondary | ICD-10-CM | POA: Diagnosis not present

## 2023-06-28 DIAGNOSIS — M170 Bilateral primary osteoarthritis of knee: Secondary | ICD-10-CM | POA: Diagnosis not present

## 2023-07-04 DIAGNOSIS — J208 Acute bronchitis due to other specified organisms: Secondary | ICD-10-CM | POA: Diagnosis not present

## 2023-07-04 DIAGNOSIS — R0602 Shortness of breath: Secondary | ICD-10-CM | POA: Diagnosis not present

## 2023-07-07 DIAGNOSIS — L97522 Non-pressure chronic ulcer of other part of left foot with fat layer exposed: Secondary | ICD-10-CM | POA: Diagnosis not present

## 2023-07-07 DIAGNOSIS — E1142 Type 2 diabetes mellitus with diabetic polyneuropathy: Secondary | ICD-10-CM | POA: Diagnosis not present

## 2023-07-09 DIAGNOSIS — R0602 Shortness of breath: Secondary | ICD-10-CM | POA: Diagnosis not present

## 2023-07-09 DIAGNOSIS — J40 Bronchitis, not specified as acute or chronic: Secondary | ICD-10-CM | POA: Diagnosis not present

## 2023-07-09 DIAGNOSIS — R059 Cough, unspecified: Secondary | ICD-10-CM | POA: Diagnosis not present

## 2023-07-09 DIAGNOSIS — R0989 Other specified symptoms and signs involving the circulatory and respiratory systems: Secondary | ICD-10-CM | POA: Diagnosis not present

## 2023-07-09 DIAGNOSIS — R0982 Postnasal drip: Secondary | ICD-10-CM | POA: Diagnosis not present

## 2023-07-09 DIAGNOSIS — R051 Acute cough: Secondary | ICD-10-CM | POA: Diagnosis not present

## 2023-07-09 DIAGNOSIS — R0600 Dyspnea, unspecified: Secondary | ICD-10-CM | POA: Diagnosis not present

## 2023-07-10 DIAGNOSIS — R059 Cough, unspecified: Secondary | ICD-10-CM | POA: Diagnosis not present

## 2023-07-10 DIAGNOSIS — R0600 Dyspnea, unspecified: Secondary | ICD-10-CM | POA: Diagnosis not present

## 2023-07-12 DIAGNOSIS — R058 Other specified cough: Secondary | ICD-10-CM | POA: Diagnosis not present

## 2023-07-12 DIAGNOSIS — R739 Hyperglycemia, unspecified: Secondary | ICD-10-CM | POA: Diagnosis not present

## 2023-07-12 DIAGNOSIS — R0609 Other forms of dyspnea: Secondary | ICD-10-CM | POA: Diagnosis not present

## 2023-07-12 DIAGNOSIS — R0600 Dyspnea, unspecified: Secondary | ICD-10-CM | POA: Diagnosis not present

## 2023-07-12 DIAGNOSIS — M7989 Other specified soft tissue disorders: Secondary | ICD-10-CM | POA: Diagnosis not present

## 2023-07-14 DIAGNOSIS — J189 Pneumonia, unspecified organism: Secondary | ICD-10-CM | POA: Diagnosis not present

## 2023-07-14 DIAGNOSIS — R0600 Dyspnea, unspecified: Secondary | ICD-10-CM | POA: Diagnosis not present

## 2023-07-16 DIAGNOSIS — R0600 Dyspnea, unspecified: Secondary | ICD-10-CM | POA: Diagnosis not present

## 2023-07-28 DIAGNOSIS — J84112 Idiopathic pulmonary fibrosis: Secondary | ICD-10-CM | POA: Diagnosis not present

## 2023-08-08 DIAGNOSIS — J849 Interstitial pulmonary disease, unspecified: Secondary | ICD-10-CM | POA: Diagnosis not present

## 2023-08-09 DIAGNOSIS — K219 Gastro-esophageal reflux disease without esophagitis: Secondary | ICD-10-CM | POA: Diagnosis not present

## 2023-08-09 DIAGNOSIS — R0600 Dyspnea, unspecified: Secondary | ICD-10-CM | POA: Diagnosis not present

## 2023-08-09 DIAGNOSIS — K5901 Slow transit constipation: Secondary | ICD-10-CM | POA: Diagnosis not present

## 2023-08-09 DIAGNOSIS — J84112 Idiopathic pulmonary fibrosis: Secondary | ICD-10-CM | POA: Diagnosis not present

## 2023-08-11 DIAGNOSIS — J849 Interstitial pulmonary disease, unspecified: Secondary | ICD-10-CM | POA: Diagnosis not present

## 2023-10-14 DIAGNOSIS — M7042 Prepatellar bursitis, left knee: Secondary | ICD-10-CM | POA: Diagnosis not present

## 2023-10-14 DIAGNOSIS — M170 Bilateral primary osteoarthritis of knee: Secondary | ICD-10-CM | POA: Diagnosis not present

## 2023-11-02 DIAGNOSIS — G4733 Obstructive sleep apnea (adult) (pediatric): Secondary | ICD-10-CM | POA: Diagnosis not present

## 2023-11-07 DIAGNOSIS — C61 Malignant neoplasm of prostate: Secondary | ICD-10-CM | POA: Diagnosis not present

## 2023-11-08 DIAGNOSIS — G4733 Obstructive sleep apnea (adult) (pediatric): Secondary | ICD-10-CM | POA: Diagnosis not present

## 2023-11-10 DIAGNOSIS — M25512 Pain in left shoulder: Secondary | ICD-10-CM | POA: Diagnosis not present

## 2023-11-10 DIAGNOSIS — J84112 Idiopathic pulmonary fibrosis: Secondary | ICD-10-CM | POA: Diagnosis not present

## 2023-11-10 DIAGNOSIS — E1165 Type 2 diabetes mellitus with hyperglycemia: Secondary | ICD-10-CM | POA: Diagnosis not present

## 2023-11-10 DIAGNOSIS — I10 Essential (primary) hypertension: Secondary | ICD-10-CM | POA: Diagnosis not present

## 2023-11-10 DIAGNOSIS — G4733 Obstructive sleep apnea (adult) (pediatric): Secondary | ICD-10-CM | POA: Diagnosis not present

## 2023-11-11 DIAGNOSIS — M170 Bilateral primary osteoarthritis of knee: Secondary | ICD-10-CM | POA: Diagnosis not present

## 2023-11-11 DIAGNOSIS — M7042 Prepatellar bursitis, left knee: Secondary | ICD-10-CM | POA: Diagnosis not present

## 2023-11-14 DIAGNOSIS — R3915 Urgency of urination: Secondary | ICD-10-CM | POA: Diagnosis not present

## 2023-11-14 DIAGNOSIS — C61 Malignant neoplasm of prostate: Secondary | ICD-10-CM | POA: Diagnosis not present

## 2023-11-25 DIAGNOSIS — H25013 Cortical age-related cataract, bilateral: Secondary | ICD-10-CM | POA: Diagnosis not present

## 2023-11-25 DIAGNOSIS — H401131 Primary open-angle glaucoma, bilateral, mild stage: Secondary | ICD-10-CM | POA: Diagnosis not present

## 2023-11-25 DIAGNOSIS — H2513 Age-related nuclear cataract, bilateral: Secondary | ICD-10-CM | POA: Diagnosis not present
